# Patient Record
Sex: Female | Born: 1945 | Race: Black or African American | Hispanic: No | Smoking: Former smoker
Health system: Southern US, Community
[De-identification: ages and names within clinical notes are randomized; demographics above are authoritative.]

## PROBLEM LIST (undated history)

## (undated) DIAGNOSIS — R351 Nocturia: Secondary | ICD-10-CM

## (undated) DIAGNOSIS — E871 Hypo-osmolality and hyponatremia: Secondary | ICD-10-CM

## (undated) DIAGNOSIS — R5381 Other malaise: Secondary | ICD-10-CM

## (undated) DIAGNOSIS — J302 Other seasonal allergic rhinitis: Secondary | ICD-10-CM

## (undated) DIAGNOSIS — K219 Gastro-esophageal reflux disease without esophagitis: Secondary | ICD-10-CM

## (undated) DIAGNOSIS — R531 Weakness: Secondary | ICD-10-CM

## (undated) DIAGNOSIS — R400 Somnolence: Secondary | ICD-10-CM

## (undated) DIAGNOSIS — E785 Hyperlipidemia, unspecified: Secondary | ICD-10-CM

## (undated) DIAGNOSIS — I952 Hypotension due to drugs: Secondary | ICD-10-CM

## (undated) DIAGNOSIS — M797 Fibromyalgia: Secondary | ICD-10-CM

## (undated) DIAGNOSIS — M542 Cervicalgia: Secondary | ICD-10-CM

## (undated) DIAGNOSIS — E46 Unspecified protein-calorie malnutrition: Secondary | ICD-10-CM

## (undated) DIAGNOSIS — R3915 Urgency of urination: Secondary | ICD-10-CM

## (undated) DIAGNOSIS — R350 Frequency of micturition: Secondary | ICD-10-CM

## (undated) DIAGNOSIS — M549 Dorsalgia, unspecified: Secondary | ICD-10-CM

## (undated) DIAGNOSIS — M4712 Other spondylosis with myelopathy, cervical region: Secondary | ICD-10-CM

## (undated) DIAGNOSIS — D62 Acute posthemorrhagic anemia: Secondary | ICD-10-CM

## (undated) DIAGNOSIS — M62838 Other muscle spasm: Secondary | ICD-10-CM

## (undated) DIAGNOSIS — R1310 Dysphagia, unspecified: Secondary | ICD-10-CM

## (undated) DIAGNOSIS — M2550 Pain in unspecified joint: Secondary | ICD-10-CM

## (undated) DIAGNOSIS — F419 Anxiety disorder, unspecified: Secondary | ICD-10-CM

## (undated) DIAGNOSIS — M1990 Unspecified osteoarthritis, unspecified site: Secondary | ICD-10-CM

## (undated) DIAGNOSIS — H269 Unspecified cataract: Secondary | ICD-10-CM

## (undated) DIAGNOSIS — F329 Major depressive disorder, single episode, unspecified: Secondary | ICD-10-CM

## (undated) DIAGNOSIS — G8929 Other chronic pain: Secondary | ICD-10-CM

## (undated) DIAGNOSIS — I10 Essential (primary) hypertension: Secondary | ICD-10-CM

## (undated) DIAGNOSIS — F32A Depression, unspecified: Secondary | ICD-10-CM

## (undated) DIAGNOSIS — J189 Pneumonia, unspecified organism: Secondary | ICD-10-CM

## (undated) HISTORY — DX: Other spondylosis with myelopathy, cervical region: M47.12

## (undated) HISTORY — DX: Somnolence: R40.0

## (undated) HISTORY — DX: Hypotension due to drugs: I95.2

## (undated) HISTORY — PX: HAND SURGERY: SHX662

## (undated) HISTORY — DX: Essential (primary) hypertension: I10

## (undated) HISTORY — DX: Depression, unspecified: F32.A

## (undated) HISTORY — DX: Fibromyalgia: M79.7

## (undated) HISTORY — DX: Other malaise: R53.81

## (undated) HISTORY — DX: Dysphagia, unspecified: R13.10

## (undated) HISTORY — DX: Acute posthemorrhagic anemia: D62

## (undated) HISTORY — DX: Anxiety disorder, unspecified: F41.9

## (undated) HISTORY — DX: Unspecified protein-calorie malnutrition: E46

## (undated) HISTORY — DX: Hyperlipidemia, unspecified: E78.5

## (undated) HISTORY — DX: Hypo-osmolality and hyponatremia: E87.1

## (undated) HISTORY — DX: Major depressive disorder, single episode, unspecified: F32.9

## (undated) HISTORY — PX: ABDOMINAL HYSTERECTOMY: SHX81

---

## 1997-12-08 ENCOUNTER — Encounter: Admission: RE | Admit: 1997-12-08 | Discharge: 1997-12-08 | Payer: Self-pay | Admitting: Internal Medicine

## 1998-02-05 ENCOUNTER — Encounter: Admission: RE | Admit: 1998-02-05 | Discharge: 1998-02-05 | Payer: Self-pay | Admitting: Internal Medicine

## 1998-02-19 ENCOUNTER — Encounter: Admission: RE | Admit: 1998-02-19 | Discharge: 1998-02-19 | Payer: Self-pay | Admitting: Internal Medicine

## 1998-03-01 ENCOUNTER — Encounter: Admission: RE | Admit: 1998-03-01 | Discharge: 1998-03-01 | Payer: Self-pay | Admitting: Hematology and Oncology

## 1998-03-15 DIAGNOSIS — K219 Gastro-esophageal reflux disease without esophagitis: Secondary | ICD-10-CM

## 1998-06-13 ENCOUNTER — Encounter: Admission: RE | Admit: 1998-06-13 | Discharge: 1998-06-13 | Payer: Self-pay | Admitting: Internal Medicine

## 1998-08-09 ENCOUNTER — Ambulatory Visit (HOSPITAL_COMMUNITY): Admission: RE | Admit: 1998-08-09 | Discharge: 1998-08-09 | Payer: Self-pay | Admitting: Internal Medicine

## 1998-08-09 ENCOUNTER — Encounter: Admission: RE | Admit: 1998-08-09 | Discharge: 1998-08-09 | Payer: Self-pay | Admitting: Internal Medicine

## 1998-10-30 ENCOUNTER — Encounter: Admission: RE | Admit: 1998-10-30 | Discharge: 1998-10-30 | Payer: Self-pay | Admitting: Hematology and Oncology

## 1998-12-07 ENCOUNTER — Ambulatory Visit (HOSPITAL_COMMUNITY): Admission: RE | Admit: 1998-12-07 | Discharge: 1998-12-07 | Payer: Self-pay | Admitting: Internal Medicine

## 1998-12-07 ENCOUNTER — Encounter: Admission: RE | Admit: 1998-12-07 | Discharge: 1998-12-07 | Payer: Self-pay | Admitting: Internal Medicine

## 1998-12-07 ENCOUNTER — Encounter: Payer: Self-pay | Admitting: Internal Medicine

## 1999-02-19 ENCOUNTER — Encounter: Admission: RE | Admit: 1999-02-19 | Discharge: 1999-02-19 | Payer: Self-pay | Admitting: Internal Medicine

## 1999-03-18 ENCOUNTER — Emergency Department (HOSPITAL_COMMUNITY): Admission: EM | Admit: 1999-03-18 | Discharge: 1999-03-18 | Payer: Self-pay | Admitting: Emergency Medicine

## 1999-03-18 ENCOUNTER — Encounter: Admission: RE | Admit: 1999-03-18 | Discharge: 1999-03-18 | Payer: Self-pay | Admitting: Internal Medicine

## 1999-12-25 ENCOUNTER — Ambulatory Visit (HOSPITAL_COMMUNITY): Admission: RE | Admit: 1999-12-25 | Discharge: 1999-12-25 | Payer: Self-pay | Admitting: *Deleted

## 2000-01-01 ENCOUNTER — Emergency Department (HOSPITAL_COMMUNITY): Admission: EM | Admit: 2000-01-01 | Discharge: 2000-01-01 | Payer: Self-pay | Admitting: Emergency Medicine

## 2000-11-28 ENCOUNTER — Encounter: Payer: Self-pay | Admitting: Emergency Medicine

## 2000-11-28 ENCOUNTER — Emergency Department (HOSPITAL_COMMUNITY): Admission: EM | Admit: 2000-11-28 | Discharge: 2000-11-28 | Payer: Self-pay | Admitting: Emergency Medicine

## 2001-02-16 ENCOUNTER — Emergency Department (HOSPITAL_COMMUNITY): Admission: EM | Admit: 2001-02-16 | Discharge: 2001-02-16 | Payer: Self-pay

## 2001-05-06 ENCOUNTER — Encounter: Payer: Self-pay | Admitting: Family Medicine

## 2001-05-06 ENCOUNTER — Encounter: Admission: RE | Admit: 2001-05-06 | Discharge: 2001-05-06 | Payer: Self-pay | Admitting: Family Medicine

## 2001-06-14 ENCOUNTER — Other Ambulatory Visit: Admission: RE | Admit: 2001-06-14 | Discharge: 2001-06-14 | Payer: Self-pay | Admitting: Internal Medicine

## 2001-09-26 ENCOUNTER — Inpatient Hospital Stay (HOSPITAL_COMMUNITY): Admission: EM | Admit: 2001-09-26 | Discharge: 2001-09-28 | Payer: Self-pay | Admitting: Emergency Medicine

## 2001-09-26 ENCOUNTER — Encounter: Payer: Self-pay | Admitting: Emergency Medicine

## 2001-09-28 ENCOUNTER — Encounter: Payer: Self-pay | Admitting: *Deleted

## 2002-02-15 ENCOUNTER — Encounter: Admission: RE | Admit: 2002-02-15 | Discharge: 2002-02-15 | Payer: Self-pay | Admitting: Internal Medicine

## 2002-08-29 ENCOUNTER — Encounter: Admission: RE | Admit: 2002-08-29 | Discharge: 2002-08-29 | Payer: Self-pay | Admitting: Internal Medicine

## 2002-09-05 ENCOUNTER — Encounter: Admission: RE | Admit: 2002-09-05 | Discharge: 2002-09-05 | Payer: Self-pay | Admitting: Internal Medicine

## 2002-09-06 ENCOUNTER — Ambulatory Visit (HOSPITAL_COMMUNITY): Admission: RE | Admit: 2002-09-06 | Discharge: 2002-09-06 | Payer: Self-pay | Admitting: Internal Medicine

## 2002-10-20 ENCOUNTER — Encounter: Admission: RE | Admit: 2002-10-20 | Discharge: 2002-10-20 | Payer: Self-pay | Admitting: Internal Medicine

## 2002-12-09 ENCOUNTER — Encounter: Admission: RE | Admit: 2002-12-09 | Discharge: 2002-12-09 | Payer: Self-pay | Admitting: Internal Medicine

## 2003-01-17 ENCOUNTER — Encounter: Admission: RE | Admit: 2003-01-17 | Discharge: 2003-01-17 | Payer: Self-pay | Admitting: Internal Medicine

## 2003-04-21 ENCOUNTER — Encounter: Admission: RE | Admit: 2003-04-21 | Discharge: 2003-04-21 | Payer: Self-pay | Admitting: Internal Medicine

## 2003-06-15 ENCOUNTER — Encounter: Admission: RE | Admit: 2003-06-15 | Discharge: 2003-06-15 | Payer: Self-pay | Admitting: Internal Medicine

## 2003-09-25 ENCOUNTER — Emergency Department (HOSPITAL_COMMUNITY): Admission: EM | Admit: 2003-09-25 | Discharge: 2003-09-25 | Payer: Self-pay | Admitting: Emergency Medicine

## 2003-09-29 ENCOUNTER — Emergency Department (HOSPITAL_COMMUNITY): Admission: EM | Admit: 2003-09-29 | Discharge: 2003-09-29 | Payer: Self-pay | Admitting: Emergency Medicine

## 2003-10-19 ENCOUNTER — Encounter: Admission: RE | Admit: 2003-10-19 | Discharge: 2003-10-19 | Payer: Self-pay | Admitting: Internal Medicine

## 2003-10-24 ENCOUNTER — Encounter: Admission: RE | Admit: 2003-10-24 | Discharge: 2003-10-24 | Payer: Self-pay | Admitting: Internal Medicine

## 2003-10-26 ENCOUNTER — Ambulatory Visit (HOSPITAL_COMMUNITY): Admission: RE | Admit: 2003-10-26 | Discharge: 2003-10-26 | Payer: Self-pay

## 2003-10-27 ENCOUNTER — Encounter: Admission: RE | Admit: 2003-10-27 | Discharge: 2003-10-27 | Payer: Self-pay | Admitting: Internal Medicine

## 2003-12-26 ENCOUNTER — Encounter: Admission: RE | Admit: 2003-12-26 | Discharge: 2003-12-26 | Payer: Self-pay | Admitting: Internal Medicine

## 2004-03-13 ENCOUNTER — Encounter: Admission: RE | Admit: 2004-03-13 | Discharge: 2004-03-13 | Payer: Self-pay | Admitting: Internal Medicine

## 2004-03-18 ENCOUNTER — Ambulatory Visit (HOSPITAL_COMMUNITY): Admission: RE | Admit: 2004-03-18 | Discharge: 2004-03-18 | Payer: Self-pay | Admitting: Internal Medicine

## 2004-03-21 ENCOUNTER — Ambulatory Visit: Payer: Self-pay | Admitting: Internal Medicine

## 2004-03-21 ENCOUNTER — Encounter: Admission: RE | Admit: 2004-03-21 | Discharge: 2004-03-21 | Payer: Self-pay | Admitting: Internal Medicine

## 2004-07-09 ENCOUNTER — Ambulatory Visit: Payer: Self-pay | Admitting: Internal Medicine

## 2004-07-24 ENCOUNTER — Ambulatory Visit: Payer: Self-pay | Admitting: Internal Medicine

## 2004-09-03 ENCOUNTER — Ambulatory Visit: Payer: Self-pay | Admitting: Internal Medicine

## 2004-10-29 ENCOUNTER — Ambulatory Visit (HOSPITAL_COMMUNITY): Admission: RE | Admit: 2004-10-29 | Discharge: 2004-10-29 | Payer: Self-pay | Admitting: Internal Medicine

## 2004-11-29 ENCOUNTER — Ambulatory Visit: Payer: Self-pay | Admitting: Internal Medicine

## 2005-02-13 ENCOUNTER — Ambulatory Visit: Payer: Self-pay | Admitting: Internal Medicine

## 2005-05-01 ENCOUNTER — Ambulatory Visit: Payer: Self-pay | Admitting: Hospitalist

## 2005-05-07 ENCOUNTER — Ambulatory Visit: Payer: Self-pay | Admitting: Internal Medicine

## 2005-09-05 ENCOUNTER — Ambulatory Visit: Payer: Self-pay | Admitting: Internal Medicine

## 2005-10-20 ENCOUNTER — Ambulatory Visit: Payer: Self-pay | Admitting: Internal Medicine

## 2005-10-30 ENCOUNTER — Ambulatory Visit (HOSPITAL_COMMUNITY): Admission: RE | Admit: 2005-10-30 | Discharge: 2005-10-30 | Payer: Self-pay | Admitting: Internal Medicine

## 2005-11-13 ENCOUNTER — Ambulatory Visit: Payer: Self-pay | Admitting: Internal Medicine

## 2005-11-27 ENCOUNTER — Ambulatory Visit: Payer: Self-pay | Admitting: Internal Medicine

## 2006-04-29 ENCOUNTER — Ambulatory Visit: Payer: Self-pay | Admitting: Internal Medicine

## 2006-04-29 DIAGNOSIS — IMO0001 Reserved for inherently not codable concepts without codable children: Secondary | ICD-10-CM

## 2006-04-29 DIAGNOSIS — I10 Essential (primary) hypertension: Secondary | ICD-10-CM

## 2006-04-29 DIAGNOSIS — F329 Major depressive disorder, single episode, unspecified: Secondary | ICD-10-CM

## 2006-04-29 DIAGNOSIS — M949 Disorder of cartilage, unspecified: Secondary | ICD-10-CM

## 2006-04-29 DIAGNOSIS — M545 Low back pain: Secondary | ICD-10-CM

## 2006-04-29 DIAGNOSIS — F411 Generalized anxiety disorder: Secondary | ICD-10-CM | POA: Insufficient documentation

## 2006-04-29 DIAGNOSIS — M899 Disorder of bone, unspecified: Secondary | ICD-10-CM | POA: Insufficient documentation

## 2006-04-29 DIAGNOSIS — F32A Depression, unspecified: Secondary | ICD-10-CM | POA: Insufficient documentation

## 2006-04-30 ENCOUNTER — Ambulatory Visit: Payer: Self-pay | Admitting: Internal Medicine

## 2006-05-20 DIAGNOSIS — Z9079 Acquired absence of other genital organ(s): Secondary | ICD-10-CM | POA: Insufficient documentation

## 2006-05-20 DIAGNOSIS — Z87891 Personal history of nicotine dependence: Secondary | ICD-10-CM

## 2006-09-24 ENCOUNTER — Ambulatory Visit (HOSPITAL_COMMUNITY): Admission: RE | Admit: 2006-09-24 | Discharge: 2006-09-24 | Payer: Self-pay | Admitting: Internal Medicine

## 2006-09-24 ENCOUNTER — Ambulatory Visit: Payer: Self-pay | Admitting: Hospitalist

## 2006-09-24 ENCOUNTER — Encounter (INDEPENDENT_AMBULATORY_CARE_PROVIDER_SITE_OTHER): Payer: Self-pay | Admitting: Pulmonary Disease

## 2006-09-24 DIAGNOSIS — R071 Chest pain on breathing: Secondary | ICD-10-CM | POA: Insufficient documentation

## 2006-09-24 DIAGNOSIS — E785 Hyperlipidemia, unspecified: Secondary | ICD-10-CM | POA: Insufficient documentation

## 2006-09-24 DIAGNOSIS — N390 Urinary tract infection, site not specified: Secondary | ICD-10-CM

## 2006-09-24 DIAGNOSIS — G43909 Migraine, unspecified, not intractable, without status migrainosus: Secondary | ICD-10-CM | POA: Insufficient documentation

## 2006-09-24 DIAGNOSIS — M19049 Primary osteoarthritis, unspecified hand: Secondary | ICD-10-CM | POA: Insufficient documentation

## 2006-09-24 DIAGNOSIS — R05 Cough: Secondary | ICD-10-CM

## 2006-09-24 LAB — CONVERTED CEMR LAB
ALT: 13 units/L (ref 0–35)
AST: 18 units/L (ref 0–37)
BUN: 9 mg/dL (ref 6–23)
Bilirubin Urine: NEGATIVE
CRP: 0.1 mg/dL (ref <0.6)
Chloride: 101 meq/L (ref 96–112)
Glucose, Bld: 94 mg/dL (ref 70–99)
Ketones, ur: NEGATIVE mg/dL
Potassium: 3.5 meq/L (ref 3.5–5.3)
RBC / HPF: NONE SEEN (ref <3)
Rhuematoid fact SerPl-aCnc: <20 intl units/mL (ref 0–20)
Total Bilirubin: 0.5 mg/dL (ref 0.3–1.2)
Urobilinogen, UA: 0.2 (ref 0.0–1.0)
WBC, UA: NONE SEEN cells/hpf (ref <3)

## 2006-09-29 ENCOUNTER — Ambulatory Visit: Payer: Self-pay | Admitting: Hospitalist

## 2006-10-08 ENCOUNTER — Ambulatory Visit: Payer: Self-pay | Admitting: *Deleted

## 2006-10-08 ENCOUNTER — Encounter (INDEPENDENT_AMBULATORY_CARE_PROVIDER_SITE_OTHER): Payer: Self-pay | Admitting: *Deleted

## 2006-10-22 ENCOUNTER — Ambulatory Visit: Payer: Self-pay | Admitting: Internal Medicine

## 2006-11-02 ENCOUNTER — Ambulatory Visit (HOSPITAL_COMMUNITY): Admission: RE | Admit: 2006-11-02 | Discharge: 2006-11-02 | Payer: Self-pay | Admitting: Obstetrics & Gynecology

## 2006-11-05 ENCOUNTER — Ambulatory Visit: Payer: Self-pay | Admitting: Internal Medicine

## 2006-11-06 ENCOUNTER — Telehealth: Payer: Self-pay | Admitting: *Deleted

## 2006-11-10 ENCOUNTER — Encounter (INDEPENDENT_AMBULATORY_CARE_PROVIDER_SITE_OTHER): Payer: Self-pay | Admitting: Pulmonary Disease

## 2006-11-10 ENCOUNTER — Ambulatory Visit: Payer: Self-pay | Admitting: Internal Medicine

## 2006-11-10 LAB — CONVERTED CEMR LAB
CO2: 26 meq/L (ref 19–32)
Chloride: 103 meq/L (ref 96–112)
Creatinine, Ser: 0.76 mg/dL (ref 0.40–1.20)
LDL Cholesterol: 133 mg/dL — ABNORMAL HIGH (ref 0–99)
Potassium: 3.7 meq/L (ref 3.5–5.3)
Total CHOL/HDL Ratio: 3.9
Triglycerides: 109 mg/dL (ref <150)
VLDL: 22 mg/dL (ref 0–40)

## 2006-11-13 ENCOUNTER — Ambulatory Visit: Payer: Self-pay | Admitting: Cardiology

## 2006-11-13 ENCOUNTER — Encounter (INDEPENDENT_AMBULATORY_CARE_PROVIDER_SITE_OTHER): Payer: Self-pay | Admitting: Pulmonary Disease

## 2006-11-24 ENCOUNTER — Encounter (INDEPENDENT_AMBULATORY_CARE_PROVIDER_SITE_OTHER): Payer: Self-pay | Admitting: Internal Medicine

## 2006-11-24 ENCOUNTER — Ambulatory Visit: Payer: Self-pay

## 2006-12-08 ENCOUNTER — Ambulatory Visit: Payer: Self-pay | Admitting: Internal Medicine

## 2007-01-04 ENCOUNTER — Encounter (INDEPENDENT_AMBULATORY_CARE_PROVIDER_SITE_OTHER): Payer: Self-pay | Admitting: Internal Medicine

## 2007-01-04 DIAGNOSIS — R079 Chest pain, unspecified: Secondary | ICD-10-CM

## 2007-04-27 ENCOUNTER — Encounter (INDEPENDENT_AMBULATORY_CARE_PROVIDER_SITE_OTHER): Payer: Self-pay | Admitting: *Deleted

## 2007-04-27 ENCOUNTER — Ambulatory Visit: Payer: Self-pay | Admitting: Internal Medicine

## 2007-04-27 LAB — CONVERTED CEMR LAB
CO2: 26 meq/L (ref 19–32)
Creatinine, Ser: 1.12 mg/dL (ref 0.40–1.20)
Glucose, Bld: 98 mg/dL (ref 70–99)
Potassium: 4.2 meq/L (ref 3.5–5.3)

## 2007-05-04 ENCOUNTER — Ambulatory Visit: Payer: Self-pay | Admitting: Hospitalist

## 2007-05-04 ENCOUNTER — Encounter (INDEPENDENT_AMBULATORY_CARE_PROVIDER_SITE_OTHER): Payer: Self-pay | Admitting: *Deleted

## 2007-05-05 LAB — CONVERTED CEMR LAB
BUN: 10 mg/dL (ref 6–23)
Cholesterol: 201 mg/dL — ABNORMAL HIGH (ref 0–200)
Glucose, Bld: 101 mg/dL — ABNORMAL HIGH (ref 70–99)
HDL: 53 mg/dL (ref >39)
Sodium: 141 meq/L (ref 135–145)
Total CHOL/HDL Ratio: 3.8

## 2007-09-02 ENCOUNTER — Ambulatory Visit: Payer: Self-pay | Admitting: Hospitalist

## 2007-09-05 LAB — CONVERTED CEMR LAB: OCCULT 2: NEGATIVE

## 2007-09-08 ENCOUNTER — Ambulatory Visit: Payer: Self-pay | Admitting: Internal Medicine

## 2007-11-01 ENCOUNTER — Ambulatory Visit: Payer: Self-pay | Admitting: Internal Medicine

## 2007-11-01 ENCOUNTER — Encounter: Payer: Self-pay | Admitting: Internal Medicine

## 2007-11-01 LAB — CONVERTED CEMR LAB
BUN: 11 mg/dL (ref 6–23)
CO2: 28 meq/L (ref 19–32)
Chloride: 101 meq/L (ref 96–112)
Potassium: 4.2 meq/L (ref 3.5–5.3)

## 2007-11-03 ENCOUNTER — Ambulatory Visit (HOSPITAL_COMMUNITY): Admission: RE | Admit: 2007-11-03 | Discharge: 2007-11-03 | Payer: Self-pay | Admitting: Pulmonary Disease

## 2007-11-16 ENCOUNTER — Ambulatory Visit: Payer: Self-pay | Admitting: Internal Medicine

## 2007-11-30 ENCOUNTER — Ambulatory Visit: Payer: Self-pay | Admitting: Internal Medicine

## 2007-12-22 ENCOUNTER — Ambulatory Visit: Payer: Self-pay | Admitting: Internal Medicine

## 2008-01-20 ENCOUNTER — Encounter: Payer: Self-pay | Admitting: Internal Medicine

## 2008-01-20 ENCOUNTER — Ambulatory Visit: Payer: Self-pay | Admitting: Internal Medicine

## 2008-01-20 DIAGNOSIS — R1013 Epigastric pain: Secondary | ICD-10-CM

## 2008-01-23 LAB — CONVERTED CEMR LAB
Albumin: 4.5 g/dL (ref 3.5–5.2)
CO2: 29 meq/L (ref 19–32)
Chloride: 102 meq/L (ref 96–112)
Cholesterol: 213 mg/dL — ABNORMAL HIGH (ref 0–200)
Creatinine, Ser: 0.83 mg/dL (ref 0.40–1.20)
Lipase: 33 units/L (ref 0–75)
Potassium: 4 meq/L (ref 3.5–5.3)
Sodium: 141 meq/L (ref 135–145)
Total CHOL/HDL Ratio: 4
Triglycerides: 87 mg/dL (ref <150)
VLDL: 17 mg/dL (ref 0–40)

## 2008-01-24 ENCOUNTER — Telehealth: Payer: Self-pay | Admitting: *Deleted

## 2008-02-01 ENCOUNTER — Ambulatory Visit: Payer: Self-pay | Admitting: Internal Medicine

## 2008-06-05 ENCOUNTER — Encounter: Payer: Self-pay | Admitting: Internal Medicine

## 2008-06-05 ENCOUNTER — Ambulatory Visit: Payer: Self-pay | Admitting: *Deleted

## 2008-06-05 LAB — CONVERTED CEMR LAB
Bilirubin Urine: NEGATIVE
Ketones, urine, test strip: NEGATIVE
Nitrite: NEGATIVE
Urobilinogen, UA: 0.2
WBC Urine, dipstick: NEGATIVE
pH: 7.5

## 2008-06-12 ENCOUNTER — Encounter: Payer: Self-pay | Admitting: Internal Medicine

## 2008-06-12 ENCOUNTER — Ambulatory Visit (HOSPITAL_COMMUNITY): Admission: RE | Admit: 2008-06-12 | Discharge: 2008-06-12 | Payer: Self-pay | Admitting: Internal Medicine

## 2008-10-20 ENCOUNTER — Ambulatory Visit: Payer: Self-pay | Admitting: *Deleted

## 2008-10-20 ENCOUNTER — Encounter: Payer: Self-pay | Admitting: Internal Medicine

## 2008-10-20 LAB — CONVERTED CEMR LAB
ALT: 11 units/L (ref 0–35)
Bilirubin Urine: NEGATIVE
Chloride: 103 meq/L (ref 96–112)
GFR calc Af Amer: >60 mL/min (ref >60)
GFR calc non Af Amer: >60 mL/min (ref >60)
Glucose, Urine, Semiquant: NEGATIVE
Magnesium: 2.2 mg/dL (ref 1.5–2.5)
Potassium: 3.9 meq/L (ref 3.5–5.3)
Sodium: 142 meq/L (ref 135–145)
Specific Gravity, Urine: 1.01
TSH: 0.48 microintl units/mL (ref 0.350–4.500)
Total CK: 191 units/L — ABNORMAL HIGH (ref 7–177)
Urobilinogen, UA: 0.2
WBC Urine, dipstick: NEGATIVE

## 2008-12-19 ENCOUNTER — Encounter (INDEPENDENT_AMBULATORY_CARE_PROVIDER_SITE_OTHER): Payer: Self-pay | Admitting: *Deleted

## 2008-12-19 ENCOUNTER — Ambulatory Visit: Payer: Self-pay | Admitting: Infectious Disease

## 2008-12-19 LAB — CONVERTED CEMR LAB
Ketones, urine, test strip: NEGATIVE
Leukocytes, UA: NEGATIVE
Nitrite: NEGATIVE
Protein, U semiquant: NEGATIVE
Specific Gravity, Urine: 1.015
Specific Gravity, Urine: 1.011 (ref 1.005–1.030)

## 2008-12-21 ENCOUNTER — Telehealth: Payer: Self-pay | Admitting: *Deleted

## 2008-12-21 DIAGNOSIS — N760 Acute vaginitis: Secondary | ICD-10-CM | POA: Insufficient documentation

## 2008-12-21 LAB — CONVERTED CEMR LAB
Gardnerella vaginalis: POSITIVE — AB
Trichomonal Vaginitis: NEGATIVE

## 2009-02-26 ENCOUNTER — Encounter: Payer: Self-pay | Admitting: Internal Medicine

## 2009-02-26 ENCOUNTER — Ambulatory Visit: Payer: Self-pay | Admitting: Internal Medicine

## 2009-02-26 LAB — CONVERTED CEMR LAB
Bilirubin Urine: NEGATIVE
Glucose, Urine, Semiquant: NEGATIVE
Protein, U semiquant: NEGATIVE
Specific Gravity, Urine: 1.01
WBC Urine, dipstick: NEGATIVE

## 2009-02-27 LAB — CONVERTED CEMR LAB
CO2: 27 meq/L (ref 19–32)
Calcium: 9.6 mg/dL (ref 8.4–10.5)
Cholesterol: 242 mg/dL — ABNORMAL HIGH (ref 0–200)
LDL Cholesterol: 166 mg/dL — ABNORMAL HIGH (ref 0–99)
Triglycerides: 108 mg/dL (ref <150)

## 2009-03-01 ENCOUNTER — Ambulatory Visit (HOSPITAL_COMMUNITY): Admission: RE | Admit: 2009-03-01 | Discharge: 2009-03-01 | Payer: Self-pay | Admitting: Internal Medicine

## 2009-03-05 ENCOUNTER — Ambulatory Visit: Payer: Self-pay | Admitting: Internal Medicine

## 2009-03-05 LAB — CONVERTED CEMR LAB
OCCULT 1: NEGATIVE
OCCULT 3: NEGATIVE

## 2009-07-03 ENCOUNTER — Telehealth: Payer: Self-pay | Admitting: Internal Medicine

## 2009-07-06 ENCOUNTER — Emergency Department (HOSPITAL_COMMUNITY): Admission: EM | Admit: 2009-07-06 | Discharge: 2009-07-06 | Payer: Self-pay | Admitting: Emergency Medicine

## 2009-07-09 ENCOUNTER — Ambulatory Visit: Payer: Self-pay | Admitting: Internal Medicine

## 2009-10-03 ENCOUNTER — Ambulatory Visit: Payer: Self-pay | Admitting: Infectious Diseases

## 2009-11-06 ENCOUNTER — Telehealth: Payer: Self-pay | Admitting: Internal Medicine

## 2010-01-03 ENCOUNTER — Emergency Department (HOSPITAL_COMMUNITY): Admission: EM | Admit: 2010-01-03 | Discharge: 2010-01-03 | Payer: Self-pay | Admitting: Emergency Medicine

## 2010-03-05 ENCOUNTER — Ambulatory Visit (HOSPITAL_COMMUNITY): Admission: RE | Admit: 2010-03-05 | Discharge: 2010-03-05 | Payer: Self-pay | Admitting: Internal Medicine

## 2010-04-09 ENCOUNTER — Ambulatory Visit: Payer: Self-pay | Admitting: Internal Medicine

## 2010-04-09 ENCOUNTER — Telehealth: Payer: Self-pay | Admitting: Licensed Clinical Social Worker

## 2010-04-09 DIAGNOSIS — R259 Unspecified abnormal involuntary movements: Secondary | ICD-10-CM | POA: Insufficient documentation

## 2010-04-09 LAB — CONVERTED CEMR LAB
ALT: 17 units/L (ref 0–35)
AST: 20 units/L (ref 0–37)
Alkaline Phosphatase: 85 units/L (ref 39–117)
Calcium: 9.2 mg/dL (ref 8.4–10.5)
Cholesterol, target level: 200 mg/dL
Creatinine, Ser: 0.75 mg/dL (ref 0.40–1.20)
Phosphorus: 2.9 mg/dL (ref 2.3–4.6)
Potassium: 3.9 meq/L (ref 3.5–5.3)
TSH: 0.625 microintl units/mL (ref 0.350–4.5)
Total Protein: 7.2 g/dL (ref 6.0–8.3)
Vit D, 25-Hydroxy: 41 ng/mL (ref 30–89)

## 2010-05-15 ENCOUNTER — Ambulatory Visit: Payer: Self-pay | Admitting: Internal Medicine

## 2010-08-27 ENCOUNTER — Ambulatory Visit: Admission: RE | Admit: 2010-08-27 | Discharge: 2010-08-27 | Payer: Self-pay | Source: Home / Self Care

## 2010-08-29 NOTE — Assessment & Plan Note (Signed)
Summary: est-ck/fu/meds/cfb   Vital Signs:  Patient profile:   65 year old female Height:      69 inches Weight:      136.4 pounds BMI:     20.22 O2 Sat:      98 % on Room air Temp:     97.8 degrees F oral Pulse rate:   72 / minute BP sitting:   161 / 88  (right arm)  Vitals Entered By: Filomena Jungling NT II (October 03, 2009 1:32 PM)  O2 Flow:  Room air CC: trouble with blood pressure, ? allergy, Depression Is Patient Diabetic? No Pain Assessment Patient in pain? yes     Location: headaches Intensity: 10 Type: aching Onset of pain  Chronic Nutritional Status BMI of 19 -24 = normal  Have you ever been in a relationship where you felt threatened, hurt or afraid?No   Does patient need assistance? Functional Status Self care Ambulation Normal   Primary Care Provider:  Mariea Stable MD  CC:  trouble with blood pressure, ? allergy, and Depression.  History of Present Illness: Mrs Havel is a 65 yo womanan with pMH as outlined below.  She is here for f/u visit.  She is taking her medications but c/o itching and coughing.  She continues to report normal blood pressures at home.      Depression History:      The patient denies a depressed mood most of the day and a diminished interest in her usual daily activities.         Preventive Screening-Counseling & Management  Alcohol-Tobacco     Alcohol drinks/day: 0     Smoking Status: quit     Year Quit: 20+ years ago  Caffeine-Diet-Exercise     Does Patient Exercise: no  Current Medications (verified): 1)  Hydrochlorothiazide 25 Mg Tabs (Hydrochlorothiazide) .Marland Kitchen.. 1 Tablet Daily 2)  Xanax 0.5 Mg Tabs (Alprazolam) .Marland Kitchen.. 1 Tab Two Times A Day As Needed 3)  Norvasc 5 Mg Tabs (Amlodipine Besylate) .... Take 1 Tablet By Mouth Once A Day  Allergies (verified): 1)  ! Codeine 2)  ! Lexapro 3)  ! Hydrocodone 4)  ! Oxybutynin Chloride  Past History:  Past Medical History: Last updated:  12/19/2008 Anxiety Depression Osteopenia (no DEXA in system) Hypertension Question of fibromyalgia (ESR, CRP, ANA, RF all wnl). Hyperlipidemia Recurrent UTI's ? (only one U/C x 2008 and it was negative)  Past Surgical History: Last updated: 04/29/2006 Hysterectomy  Family History: Last updated: 02/26/2009 Father:  alcoholic induced CM, ESRD Brother:  throat cancer 2/2 tobacco otherwise HTN  Social History: Last updated: 02/26/2009 Single. Single Former Smoker (only smoked for about 5-6 years in her youth) Alcohol use-no Drug use-no  Risk Factors: Smoking Status: quit (10/03/2009)  Review of Systems      See HPI  Physical Exam  General:  alert, normal appearance, and cooperative to examination.   Lungs:  normal respiratory effort and no accessory muscle use.   Extremities:  no edema Neurologic:  alert & oriented X3, cranial nerves II-XII intact, strength normal in all extremities, and gait normal.   Psych:  Oriented X3, memory intact for recent and remote, normally interactive, good eye contact, not anxious appearing, and not depressed appearing.     Impression & Recommendations:  Problem # 1:  HYPERTENSION (ICD-401.9) Discussed at length.  She prefers to continue current medication before trying new medications Will increase to 10mg  daily.  Her updated medication list for this problem includes:  Hydrochlorothiazide 25 Mg Tabs (Hydrochlorothiazide) .Marland Kitchen... 1 tablet daily    Norvasc 5 Mg Tabs (Amlodipine besylate) .Marland Kitchen... Take 1 tablet by mouth once a day    Amlodipine Besylate 10 Mg Tabs (Amlodipine besylate) .Marland Kitchen... Take 1 tablet by mouth once a day  BP today: 161/88 Prior BP: 171/92 (07/09/2009)  Labs Reviewed: K+: 3.6 (02/26/2009) Creat: : 0.86 (02/26/2009)   Chol: 242 (02/26/2009)   HDL: 54 (02/26/2009)   LDL: 166 (02/26/2009)   TG: 108 (02/26/2009)  Complete Medication List: 1)  Hydrochlorothiazide 25 Mg Tabs (Hydrochlorothiazide) .Marland Kitchen.. 1 tablet daily 2)   Xanax 0.5 Mg Tabs (Alprazolam) .Marland Kitchen.. 1 tab two times a day as needed 3)  Norvasc 5 Mg Tabs (Amlodipine besylate) .... Take 1 tablet by mouth once a day 4)  Amlodipine Besylate 10 Mg Tabs (Amlodipine besylate) .... Take 1 tablet by mouth once a day  Patient Instructions: 1)  Please schedule a follow-up appointment in 6 months. 2)  Will increase your blood pressure medicine to 10mg , can take 2 of the 5 mg tablets you have until done.   3)  continue all your other medications.   4)  If you have any problems or need refills, call clinic.  Prescriptions: AMLODIPINE BESYLATE 10 MG TABS (AMLODIPINE BESYLATE) Take 1 tablet by mouth once a day  #30 x 6   Entered and Authorized by:   Mariea Stable MD   Signed by:   Mariea Stable MD on 10/03/2009   Method used:   Print then Give to Patient   RxID:   [id number]     Prevention & Chronic Care Immunizations   Influenza vaccine: Not documented   Influenza vaccine deferral: Refused  (07/09/2009)    Tetanus booster: Not documented   Td booster deferral: Refused  (07/09/2009)    Pneumococcal vaccine: Not documented    H. zoster vaccine: Not documented   H. zoster vaccine deferral: Deferred  (07/09/2009)  Colorectal Screening   Hemoccult: Negative  (05/03/2005)   Hemoccult action/deferral: Ordered  (02/26/2009)    Colonoscopy: Not documented   Colonoscopy action/deferral: Refused  (02/26/2009)  Other Screening   Pap smear: Not documented   Pap smear action/deferral: Not indicated S/P hysterectomy  (02/26/2009)    Mammogram: ASSESSMENT: Negative - BI-RADS 1^MM DIGITAL SCREENING  (03/01/2009)   Mammogram action/deferral: Ordered  (02/26/2009)    DXA bone density scan: Not documented   Smoking status: quit  (10/03/2009)  Lipids   Total Cholesterol: 242  (02/26/2009)   Lipid panel action/deferral: Lipid Panel ordered   LDL: 166  (02/26/2009)   LDL Direct: Not documented   HDL: 54  (02/26/2009)   Triglycerides: 108   (02/26/2009)    SGOT (AST): 18  (10/20/2008)   SGPT (ALT): 11  (10/20/2008)   Alkaline phosphatase: 91  (10/20/2008)   Total bilirubin: 0.5  (10/20/2008)    Lipid flowsheet reviewed?: Yes   Progress toward LDL goal: Unchanged  Hypertension   Last Blood Pressure: 161 / 88  (10/03/2009)   Serum creatinine: 0.86  (02/26/2009)   Serum potassium 3.6  (02/26/2009)    Hypertension flowsheet reviewed?: Yes   Progress toward BP goal: Unchanged  Self-Management Support :    Patient will work on the following items until the next clinic visit to reach self-care goals:     Medications and monitoring: take my medicines every day, bring all of my medications to every visit  (10/03/2009)     Eating: eat more vegetables, eat foods that are low in  salt, eat baked foods instead of fried foods  (10/03/2009)    Hypertension self-management support: Education handout, Resources for patients handout, Written self-care plan  (10/03/2009)   Hypertension self-care plan printed.   Hypertension education handout printed    Lipid self-management support: Education handout, Resources for patients handout, Written self-care plan  (10/03/2009)   Lipid self-care plan printed.   Lipid education handout printed      Resource handout printed.

## 2010-08-29 NOTE — Progress Notes (Signed)
Summary: Refill/gh  Phone Note Refill Request Message from:  Fax from Pharmacy on November 06, 2009 2:45 PM  Refills Requested: Medication #1:  XANAX 0.5 MG TABS 1 tab two times a day as needed   Last Refilled: 10/08/2009  Method Requested: Fax to Local Pharmacy Initial call taken by: Angelina Ok RN,  November 06, 2009 2:45 PM  Follow-up for Phone Call        Refill approved-nurse to complete Follow-up by: Mariea Stable MD,  November 06, 2009 3:45 PM  Additional Follow-up for Phone Call Additional follow up Details #1::        Rx faxed to pharmacy Additional Follow-up by: Angelina Ok RN,  November 07, 2009 10:46 AM    Prescriptions: Prudy Feeler 0.5 MG TABS (ALPRAZOLAM) 1 tab two times a day as needed  #62 x 3   Entered and Authorized by:   Mariea Stable MD   Signed by:   Mariea Stable MD on 11/06/2009   Method used:   Telephoned to ...       [facility] (retail)       [address] [place], [place]  [address]       Ph: [phone]       Fax: [phone]   RxID:   [id number]

## 2010-08-29 NOTE — Assessment & Plan Note (Signed)
Summary: 6 mo F/U HTN, lipids, anxiety, abd pain. New onset tremors.   Vital Signs:  Patient profile:   65 year old female Height:      69 inches (175.26 cm) Weight:      131.1 pounds (59.59 kg) BMI:     19.43 O2 Sat:      98 % on Room air Temp:     97.0 degrees F oral Pulse rate:   69 / minute BP sitting:   160 / 83  (left arm)  Vitals Entered By: Chinita Pester RN (April 09, 2010 8:33 AM)  O2 Flow:  Room air CC: 6 month f/u for hypertension, lipids, abdominal pain.  Having occasional tremors. Med. refills. Is Patient Diabetic? No Pain Assessment Patient in pain? yes     Location: "all over" Intensity: 9 Type: aching Onset of pain  Chronic Nutritional Status BMI of 19 -24 = normal  Have you ever been in a relationship where you felt threatened, hurt or afraid?Unable to ask;sister w/pt.   Does patient need assistance? Functional Status Self care Ambulation Normal   Primary Care Provider:  Mariea Stable MD  CC:  6 month f/u for hypertension, lipids, and abdominal pain.  Having occasional tremors. Med. refills..  History of Present Illness: Patient  is a 65 year old female with a PMHX of fibromyalgia, HTN, Anxiety, osteopenia who presents to clinic for new onset tremors and 6 month follow-up of the following conditions: 1) HTN - pt states home blood pressure is well-controlled with avg of 108/60-70 mmHg, as low as 90s/60s mmHg. However, she notes increased anxiety at doctor's office, which she thinks causeshigher readings. Patient tolerating increased dosage of Amlodipine, without dizziness, lightheadedness, blacking out.  2) Tremors - x few months, occuring daily, midday, not associated with fasting. 1/2-1 cup coffee daily, no other caffeine, no supplements including caffeine or stimulants. Involving hands, legs. Can last all day, causing difficulty writing, holding objects, walking. No family member with similar episodes. Seems to be precipitated by and worsened with  anxiety. No previous thyroid disorder.  3) Anxiety and Depression -  anxiety getting progressively worse over past 3 years, especially last few months. Having increased family financial stressors. Feels like it will be a short term stress that will eventually resolve. During anxiety episodes becomes isolated, tremulous. Confirms insomnia and wanting to be more isolated to avoid stressors. Denies suicidal or homicidal ideations, denies anhedonia. States she has previously been tried on "multiple depression medications" (unable to specify), but gave her stomach upset and nausea. Was supposed to start Cymbalta in 2010, but did not start for fear of stomach upset. States Xanax is the only thing that works for her.  4) GERD - feeling of gaseousness,  abdominal discomfort in epigastric region. Not worsened by spicy foods or caffeine. States actually worsened by bland foods. Prior history of H.pylori in 2009 for which she was treated. Not currently on prophylactic medications.  5) Osteopenia - taking Ca-Vitamin D supplementation daily. No new fractures.   Depression History:      The patient denies a depressed mood most of the day and a diminished interest in her usual daily activities.  Positive alarm features for depression include insomnia, psychomotor agitation, and fatigue (loss of energy).  However, she denies significant weight loss, significant weight gain, feelings of worthlessness (guilt), and recurrent thoughts of death or suicide.         Dyspepsia History:      There is a prior history of  GERD.  An H-2 blocker medication is not currently being taken.    Lipid Management History:      Positive NCEP/ATP III risk factors include female age 57 years old or older and hypertension.  Negative NCEP/ATP III risk factors include non-tobacco-user status.           Preventive Screening-Counseling & Management  Alcohol-Tobacco     Alcohol drinks/day: 0     Smoking Status: quit     Year Quit: 20+  years ago  Caffeine-Diet-Exercise     Does Patient Exercise: no  Current Medications (verified): 1)  Hydrochlorothiazide 25 Mg Tabs (Hydrochlorothiazide) .Marland Kitchen.. 1 Tablet Daily 2)  Xanax 0.5 Mg Tabs (Alprazolam) .Marland Kitchen.. 1 Tab Two Times A Day As Needed 3)  Amlodipine Besylate 10 Mg Tabs (Amlodipine Besylate) .... Take 1 Tablet By Mouth Once A Day  Allergies: 1)  ! Codeine 2)  ! Lexapro 3)  ! Hydrocodone 4)  ! Oxybutynin Chloride  Review of Systems       Per HPI  Physical Exam  General:  Well-developed,well-nourished,in no acute distress; alert,appropriate and cooperative throughout examination Head:  Normocephalic and atraumatic without obvious abnormalities. No apparent alopecia or balding. Neck:  No deformities, masses, or tenderness noted. Lungs:  Normal respiratory effort, chest expands symmetrically. Lungs are clear to auscultation, no crackles or wheezes. Heart:  Normal rate and regular rhythm. S1 and S2 normal without gallop, murmur, click, rub or other extra sounds. Abdomen:  Bowel sounds positive,abdomen soft and non-distended, tenderness to palpation over epigastric region, no organomegaly appreciated.  Msk:  No deformity or scoliosis noted of thoracic or lumbar spine.   Pulses:  R and L carotid,radial,femoral,dorsalis pedis and posterior tibial pulses are full and equal bilaterally Extremities:  No clubbing, cyanosis, edema, or deformity noted with normal full range of motion of all joints.     Impression & Recommendations:  Problem # 1:  OSTEOPENIA (ICD-733.90) No history of hip fractures, no recent fractures, taking Calcium-Vitamin D 1000mg  daily. Last Bone scan 2009 showing osteopenia with left femoral head T score of -2.4. Calculated FRAX score, 10 year probability of major osteoporotic fracture 11%, 10 year probability of hip fracture 2.1%. Guidelines are not completely clear regarding initiation of bisphosphonate therapy for osteopenia.  - Will stress importance of  daily intake of 1200mg  elemental calcium and 800 international units of vitamin D. - Will research guidelines regarding initiation of bisphosphonates and call patient to follow-up if bisphosphonate therapy is indicated at this time. - Consider follow-up DEXA Scan order next visit, to allow for 2 year interval between scans - Will order CBC to be completed before next visit.  Orders: T-Vitamin D (25-Hydroxy) [id number]) T-Phosphorus ([id number])  Future Orders: T-CBC w/Diff [id number]) ... 05/15/2010  Problem # 2:  HYPERTENSION (ICD-401.9) Patient continues to have elevated blood pressure during office visits, but continues to report low/normal blood pressures at home, therefore reluctant to increase medications today.  - Will check CMET today - Will have patient fill out blood pressure logs and bring meter to next visit, can review and compare blood pressure meter with clinic meter to ensure calibrated correctly- this may be just white coat hypertension, but may be reflective of non calibrated meter.  The following medications were removed from the medication list:    Norvasc 5 Mg Tabs (Amlodipine besylate) .Marland Kitchen... Take 1 tablet by mouth once a day Her updated medication list for this problem includes:    Hydrochlorothiazide 25 Mg Tabs (Hydrochlorothiazide) .Marland Kitchen... 1 tablet daily  Amlodipine Besylate 10 Mg Tabs (Amlodipine besylate) .Marland Kitchen... Take 1 tablet by mouth once a day  Orders: T-Comprehensive Metabolic Panel ([id number])  Problem # 3:  ANXIETY (ICD-300.00) Persistent anxiety, particularly over last few months secondary to situational family stressors. States she has previously tried anti-depressive medications, which caused abdominal discomfort (however unable to specify), per clinic notes seems was recommended but did not start Cymbalta in 2010. Per patient, she expects situational stressors will resolve soon. - Will refer to social work for counseling services to help with  coping with stressors. - Will continue current dosage of Xanax at present time and reevaluate in 1 month. At that time, if acute anxiety persistent, will consider transition to longer acting benzodiazepine such as Clonazepam versus trying an SSRI or Hydroxyzine.   Her updated medication list for this problem includes:    Xanax 0.5 Mg Tabs (Alprazolam) .Marland Kitchen... 1 tab two times a day as needed Orders: Social Work Referral (Social )  Problem # 4:  GERD (ICD-530.81) Persistent epigastric pain and discomfort. Prior history of H.pylori, treated 2009. Previously tried on Omeprazole daily with successful control of abdominal pain, however she discontinued it electively. - Will restart Omprazole daily, and reassess next visit.  - Can consider repeat H.pylori testing if persistent pain.  Her updated medication list for this problem includes:    Omeprazole 40 Mg Cpdr (Omeprazole) .Marland Kitchen... Take 1 tablet by mouth once a day  Problem # 5:  Preventive Health Care (ICD-V70.0) - Will request for patient to come fasting to next office visit, with FLP ordered for next visit. - Colonoscopy/Hemoccult cards not discussed today, previously refused. Please follow-up on these screening tests at next visit.  Complete Medication List: 1)  Hydrochlorothiazide 25 Mg Tabs (Hydrochlorothiazide) .Marland Kitchen.. 1 tablet daily 2)  Xanax 0.5 Mg Tabs (Alprazolam) .Marland Kitchen.. 1 tab two times a day as needed 3)  Amlodipine Besylate 10 Mg Tabs (Amlodipine besylate) .... Take 1 tablet by mouth once a day 4)  Vitamin D-1000 [address] St [phone] Mg-unit Tabs (Calcium-vitamin d) .... Take 1 tablet by mouth once a day 5)  Fish Oil 1000 Mg Caps (Omega-3 fatty acids) .... Take 1 tablet by mouth once a day 6)  Omeprazole 40 Mg Cpdr (Omeprazole) .... Take 1 tablet by mouth once a day  Other Orders: T-T4, Free [phone]) T-TSH ([id number]) Future Orders: T-Lipid Profile ([id number]) ... 05/15/2010  Lipid Assessment/Plan:      Based on NCEP/ATP III, the  patient's risk factor category is "0-1 risk factors".  The patient's lipid goals are as follows: Total cholesterol goal is 200; LDL cholesterol goal is 130; HDL cholesterol goal is 40; Triglyceride goal is 150.    Patient Instructions: 1)  Please follow-up with your primary care provider Dr.Alvarez in 50month at which time, please bring your blood pressure logs and blood pressure meter. 2)  Please complete blood pressure logs 1-2 times daily (around same time, after sitting for ) until you follow up with Dr. Onalee Hua. 3)  You will be started on a new medication called Omeprazole for your stomach pain and reflux, take it once daily. If you have diarrhea, abdominal pain, ear pain, severe headaches with this medication, please stop it and call the clinic [phone]). 4)  You have been referred to our Social Worker, Lupita Leash, who will help to arrange for counselling services (she will contact you). 5)  I will call you regarding your old bone scan results and if you need to start an additional medication, a bisphosphonate, for  treatment of your osteopenia. 6)  You will have lab work completed today to check your thyroid, kidneys, liver. I will call you if results are abnormal. Prescriptions: OMEPRAZOLE 40 MG CPDR (OMEPRAZOLE) Take 1 tablet by mouth once a day  #30 x 3   Entered and Authorized by:   Johnette Abraham DO   Signed by:   Johnette Abraham DO on 04/09/2010   Method used:   Print then Give to Patient   RxID:   [id number] AMLODIPINE BESYLATE 10 MG TABS (AMLODIPINE BESYLATE) Take 1 tablet by mouth once a day  #30 x 6   Entered and Authorized by:   Johnette Abraham DO   Signed by:   Johnette Abraham DO on 04/09/2010   Method used:   Reprint   RxID:   [id number] HYDROCHLOROTHIAZIDE 25 MG TABS (HYDROCHLOROTHIAZIDE) 1 tablet daily  #31 x 3   Entered and Authorized by:   Johnette Abraham DO   Signed by:   Johnette Abraham DO on 04/09/2010   Method used:    Reprint   RxID:   [id number] XANAX 0.5 MG TABS (ALPRAZOLAM) 1 tab two times a day as needed  #62 x 3   Entered and Authorized by:   Johnette Abraham DO   Signed by:   Johnette Abraham DO on 04/09/2010   Method used:   Reprint   RxID:   [id number] AMLODIPINE BESYLATE 10 MG TABS (AMLODIPINE BESYLATE) Take 1 tablet by mouth once a day  #30 x 6   Entered and Authorized by:   Johnette Abraham DO   Signed by:   Johnette Abraham DO on 04/09/2010   Method used:   Print then Give to Patient   RxID:   [id number] XANAX 0.5 MG TABS (ALPRAZOLAM) 1 tab two times a day as needed  #62 x 3   Entered and Authorized by:   Johnette Abraham DO   Signed by:   Johnette Abraham DO on 04/09/2010   Method used:   Print then Give to Patient   RxID:   [id number] HYDROCHLOROTHIAZIDE 25 MG TABS (HYDROCHLOROTHIAZIDE) 1 tablet daily  #31 x 3   Entered and Authorized by:   Johnette Abraham DO   Signed by:   Johnette Abraham DO on 04/09/2010   Method used:   Print then Give to Patient   RxID:   [id number]   Prevention & Chronic Care Immunizations   Influenza vaccine: Not documented   Influenza vaccine deferral: Refused  (04/09/2010)    Tetanus booster: Not documented   Td booster deferral: Refused  (07/09/2009)    Pneumococcal vaccine: Not documented   Pneumococcal vaccine deferral: Refused  (04/09/2010)    H. zoster vaccine: Not documented   H. zoster vaccine deferral: Deferred  (07/09/2009)  Colorectal Screening   Hemoccult: Negative  (05/03/2005)   Hemoccult action/deferral: Ordered  (02/26/2009)    Colonoscopy: Not documented   Colonoscopy action/deferral: Refused  (02/26/2009)  Other Screening   Pap smear: Not documented   Pap smear action/deferral: Not indicated S/P hysterectomy  (02/26/2009)    Mammogram: ASSESSMENT: Negative - BI-RADS 1^MM DIGITAL SCREENING  (03/05/2010)   Mammogram action/deferral: Ordered   (02/26/2009)   Mammogram due: 03/06/2011    DXA bone density scan: Not documented   Smoking status: quit  (04/09/2010)  Lipids   Total Cholesterol: 242  (02/26/2009)   Lipid panel action/deferral: Lipid Panel ordered   LDL: 166  (02/26/2009)   LDL Direct: Not documented   HDL: 54  (02/26/2009)   Triglycerides:  108  (02/26/2009)    SGOT (AST): 18  (10/20/2008)   SGPT (ALT): 11  (10/20/2008) CMP ordered    Alkaline phosphatase: 91  (10/20/2008)   Total bilirubin: 0.5  (10/20/2008)  Hypertension   Last Blood Pressure: 160 / 83  (04/09/2010)   Serum creatinine: 0.86  (02/26/2009)   Serum potassium 3.6  (02/26/2009) CMP ordered     Hypertension flowsheet reviewed?: Yes   Progress toward BP goal: Unchanged   Hypertension comments: Patient reports low-normal blood pressures at home, will bring in blood pressure log and blood pressure meter next visit.  Self-Management Support :    Patient will work on the following items until the next clinic visit to reach self-care goals:     Medications and monitoring: take my medicines every day, check my blood pressure, bring all of my medications to every visit  (04/09/2010)     Eating: use fresh or frozen vegetables, eat foods that are low in salt, eat baked foods instead of fried foods  (04/09/2010)    Hypertension self-management support: BP self-monitoring log, Written self-care plan  (04/09/2010)   Hypertension self-care plan printed.    Lipid self-management support: Written self-care plan  (04/09/2010)   Lipid self-care plan printed.  Process Orders Check Orders Results:     [facility]: ABN not required for this insurance Tests Sent for requisitioning (April 14, 2010 9:13 PM):     05/15/2010: [facility] -- T-Lipid Profile [phone] (signed)     05/15/2010: [facility] -- T-CBC w/Diff [[id number]] (signed)     04/09/2010: [facility] -- T-Vitamin D  (25-Hydroxy) [phone] (signed)     04/09/2010: [facility] -- T-Phosphorus [[id number]] (signed)     04/09/2010: [facility] -- T-Comprehensive Metabolic Panel [[id number]] (signed)     04/09/2010: [facility] -- [place], [place] [[id number]] (signed)     04/09/2010: [facility] -- T-TSH [phone] (signed)

## 2010-08-29 NOTE — Progress Notes (Signed)
  Phone Note Outgoing Call   Call placed by: Soc. Work Call placed to: Patient Summary of Call: In response to Dr. Ferne Coe referral I called Ms. Blumenberg to find out if she was interested in [facility] counseling for family stress, depression and anxiety.  She declined a referral at this time and felt like she was just having a bad day and coping with a younger sister who irritates her tremendously.   It was clear that she was not a threat to herself and others and her preference is to not participate in counseling because she doesn't want to talk with anyone about her problems.   She was receptive to my sending her my card and a [facility] brochure should she need to be connected with mental health therapy.

## 2010-08-29 NOTE — Assessment & Plan Note (Signed)
Summary: EST-1 MONTH F/UV ISIT/CH   Vital Signs:  Patient profile:   65 year old female Height:      69 inches (175.26 cm) Weight:      133.02 pounds (60.46 kg) BMI:     19.71 Temp:     97.7 degrees F (36.50 degrees C) oral Pulse rate:   80 / minute BP sitting:   170 / 92  (right arm) Cuff size:   regular  Vitals Entered By: Angelina Ok RN (May 15, 2010 8:34 AM) CC: Depression Is Patient Diabetic? No Pain Assessment Patient in pain? yes     Location: all over Intensity: 9 Type: aching Onset of pain  Constant Nutritional Status BMI of 19 -24 = normal  Have you ever been in a relationship where you felt threatened, hurt or afraid?No   Does patient need assistance? Functional Status Self care Ambulation Normal Comments Check up.  Lab results.  Problems taking Norvasc.  Brought B/P log.   Primary Care Provider:  Mariea Stable MD  CC:  Depression.  History of Present Illness: [name] is a 65 yo woman with PMH as outlined in chart.  She is here for routine follow up and lab results from last visit.  She is doing well overall.  She brings a log of BP reading which remain well controlled.  Furthermore, she brings her blood pressure meter which read 153/90 in room which is the highest reading documented by that meter.    Depression History:      The patient denies a depressed mood most of the day and a diminished interest in her usual daily activities.         Preventive Screening-Counseling & Management  Alcohol-Tobacco     Alcohol drinks/day: 0     Smoking Status: quit     Year Quit: 20+ years ago  Current Medications (verified): 1)  Hydrochlorothiazide 25 Mg Tabs (Hydrochlorothiazide) .Marland Kitchen.. 1 Tablet Daily 2)  Xanax 0.5 Mg Tabs (Alprazolam) .Marland Kitchen.. 1 Tab Two Times A Day As Needed 3)  Amlodipine Besylate 10 Mg Tabs (Amlodipine Besylate) .... Take 1 Tablet By Mouth Once A Day 4)  Vitamin D-1000 Max St [phone] Mg-Unit Tabs (Calcium-Vitamin D) .... Take 1  Tablet By Mouth Once A Day 5)  Fish Oil 1000 Mg Caps (Omega-3 Fatty Acids) .... Take 1 Tablet By Mouth Once A Day  Allergies (verified): 1)  ! Codeine 2)  ! Lexapro 3)  ! Hydrocodone 4)  ! Oxybutynin Chloride  Past History:  Past Medical History: Last updated: 12/19/2008 Anxiety Depression Osteopenia (no DEXA in system) Hypertension Question of fibromyalgia (ESR, CRP, ANA, RF all wnl). Hyperlipidemia Recurrent UTI's ? (only one U/C x 2008 and it was negative)  Past Surgical History: Last updated: 04/29/2006 Hysterectomy  Family History: Last updated: 02/26/2009 Father:  alcoholic induced CM, ESRD Brother:  throat cancer 2/2 tobacco otherwise HTN  Social History: Last updated: 02/26/2009 Single. Single Former Smoker (only smoked for about 5-6 years in her youth) Alcohol use-no Drug use-no  Risk Factors: Smoking Status: quit (05/15/2010)  Review of Systems      See HPI  Physical Exam  General:  Well-developed,well-nourished,in no acute distress; alert,appropriate and cooperative throughout examination Eyes:  anicteric Lungs:  normal respiratory effort and no accessory muscle use.   Neurologic:  alert & oriented X3 and gait normal.   Psych:  Oriented X3, memory intact for recent and remote, and normally interactive.     Impression & Recommendations:  Problem #  1:  HYPERLIPIDEMIA (ICD-272.4) LDL has been elevated in past.   However, pt does not like to take medications. Discussed this issue with pt and she does not wish to start a medication at this point....b/c of this, will check in future and revisit.  Labs Reviewed: SGOT: 20 (04/09/2010)   SGPT: 17 (04/09/2010)  Lipid Goals: Chol Goal: 200 (04/09/2010)   HDL Goal: 40 (04/09/2010)   LDL Goal: 130 (04/09/2010)   TG Goal: 150 (04/09/2010)  Prior 10 Yr Risk Heart Disease: 20 % (04/09/2010)   HDL:54 (02/26/2009), 53 (01/20/2008)  LDL:166 (02/26/2009), 143 (01/20/2008)  Chol:242 (02/26/2009), 213  (01/20/2008)  Trig:108 (02/26/2009), 87 (01/20/2008)  Problem # 2:  HYPERTENSION (ICD-401.9) Blood pressure elevated again today as usual However, BP log with good values Pt's BP meter read 153/90 this in office.  Her updated medication list for this problem includes:    Hydrochlorothiazide 25 Mg Tabs (Hydrochlorothiazide) .Marland Kitchen... 1 tablet daily    Amlodipine Besylate 10 Mg Tabs (Amlodipine besylate) .Marland Kitchen... Take 1 tablet by mouth once a day  BP today: 170/92 Prior BP: 160/83 (04/09/2010)  Prior 10 Yr Risk Heart Disease: 20 % (04/09/2010)  Labs Reviewed: K+: 3.9 (04/09/2010) Creat: : 0.75 (04/09/2010)   Chol: 242 (02/26/2009)   HDL: 54 (02/26/2009)   LDL: 166 (02/26/2009)   TG: 108 (02/26/2009)  Complete Medication List: 1)  Hydrochlorothiazide 25 Mg Tabs (Hydrochlorothiazide) .Marland Kitchen.. 1 tablet daily 2)  Xanax 0.5 Mg Tabs (Alprazolam) .Marland Kitchen.. 1 tab two times a day as needed 3)  Amlodipine Besylate 10 Mg Tabs (Amlodipine besylate) .... Take 1 tablet by mouth once a day 4)  Vitamin D-1000 Max St [phone] Mg-unit Tabs (Calcium-vitamin d) .... Take 1 tablet by mouth once a day 5)  Fish Oil 1000 Mg Caps (Omega-3 fatty acids) .... Take 1 tablet by mouth once a day  Patient Instructions: 1)  Please schedule a follow-up appointment in 6 months. 2)  Doing quite well overall. 3)  Continue medications listed below.   4)  If you have any other problems, call clinic.      Orders Added: 1)  Est. Patient Level III [16109]    Prevention & Chronic Care Immunizations   Influenza vaccine: Not documented   Influenza vaccine deferral: Refused  (04/09/2010)    Tetanus booster: Not documented   Td booster deferral: Refused  (07/09/2009)    Pneumococcal vaccine: Not documented   Pneumococcal vaccine deferral: Refused  (04/09/2010)    H. zoster vaccine: Not documented   H. zoster vaccine deferral: Deferred  (07/09/2009)  Colorectal Screening   Hemoccult: Negative  (05/03/2005)   Hemoccult  action/deferral: Ordered  (02/26/2009)    Colonoscopy: Not documented   Colonoscopy action/deferral: Refused  (02/26/2009)  Other Screening   Pap smear: Not documented   Pap smear action/deferral: Not indicated S/P hysterectomy  (02/26/2009)    Mammogram: ASSESSMENT: Negative - BI-RADS 1^MM DIGITAL SCREENING  (03/05/2010)   Mammogram action/deferral: Ordered  (02/26/2009)   Mammogram due: 03/06/2011    DXA bone density scan: Not documented   Smoking status: quit  (05/15/2010)  Lipids   Total Cholesterol: 242  (02/26/2009)   Lipid panel action/deferral: Lipid Panel ordered   LDL: 166  (02/26/2009)   LDL Direct: Not documented   HDL: 54  (02/26/2009)   Triglycerides: 108  (02/26/2009)    SGOT (AST): 20  (04/09/2010)   SGPT (ALT): 17  (04/09/2010)   Alkaline phosphatase: 85  (04/09/2010)   Total bilirubin: 0.4  (04/09/2010)  Lipid flowsheet reviewed?: Yes   Progress toward LDL goal: Unchanged  Hypertension   Last Blood Pressure: 170 / 92  (05/15/2010)   Serum creatinine: 0.75  (04/09/2010)   Serum potassium 3.9  (04/09/2010)    Hypertension flowsheet reviewed?: Yes   Progress toward BP goal: Unchanged   Hypertension comments: home values well controlled.  Self-Management Support :    Patient will work on the following items until the next clinic visit to reach self-care goals:     Medications and monitoring: take my medicines every day, check my blood pressure, bring all of my medications to every visit  (05/15/2010)     Eating: drink diet soda or water instead of juice or soda, eat more vegetables, use fresh or frozen vegetables, eat foods that are low in salt, eat fruit for snacks and desserts, limit or avoid alcohol  (05/15/2010)     Activity: take a 30 minute walk every day  (05/15/2010)    Hypertension self-management support: BP self-monitoring log, Written self-care plan, Education handout, Pre-printed educational material, Resources for patients handout   (05/15/2010)   Hypertension self-care plan printed.   Hypertension education handout printed    Lipid self-management support: Written self-care plan, Education handout, Pre-printed educational material, Resources for patients handout  (05/15/2010)   Lipid self-care plan printed.   Lipid education handout printed      Resource handout printed.    Vital Signs:  Patient profile:   65 year old female Height:      69 inches (175.26 cm) Weight:      133.02 pounds (60.46 kg) BMI:     19.71 Temp:     97.7 degrees F (36.50 degrees C) oral Pulse rate:   80 / minute BP sitting:   170 / 92  (right arm) Cuff size:   regular  Vitals Entered By: Angelina Ok RN (May 15, 2010 8:34 AM)

## 2010-09-04 NOTE — Assessment & Plan Note (Signed)
Summary: acute-medication refills/cfb(alvarez)   Vital Signs:  Patient profile:   65 year old female Height:      69 inches Weight:      132.5 pounds BMI:     19.64 Temp:     97.6 degrees F oral Pulse rate:   80 / minute BP sitting:   160 / 80  (right arm)  Vitals Entered By: Filomena Jungling NT II (August 27, 2010 8:45 AM) CC: NEED REFILLS, Depression Is Patient Diabetic? No Pain Assessment Patient in pain? yes     Location: BODY ACHES Intensity: 10 Type: aching Onset of pain  Chronic Nutritional Status BMI of < 19 = underweight  Have you ever been in a relationship where you felt threatened, hurt or afraid?No   Does patient need assistance? Functional Status Self care Ambulation Normal   Primary Care Provider:  Mariea Stable MD  CC:  NEED REFILLS and Depression.  History of Present Illness: 65 y/o w with HTN ,HLD comes for refill no complaints says she will comes back in April for her regular follow up  Depression History:      The patient denies a depressed mood most of the day and a diminished interest in her usual daily activities.         Preventive Screening-Counseling & Management  Alcohol-Tobacco     Alcohol drinks/day: 0     Smoking Status: quit     Year Quit: 20+ years ago  Caffeine-Diet-Exercise     Does Patient Exercise: no  Current Medications (verified): 1)  Hydrochlorothiazide 25 Mg Tabs (Hydrochlorothiazide) .Marland Kitchen.. 1 Tablet Daily 2)  Xanax 0.5 Mg Tabs (Alprazolam) .Marland Kitchen.. 1 Tab Two Times A Day As Needed 3)  Amlodipine Besylate 10 Mg Tabs (Amlodipine Besylate) .... Take 1 Tablet By Mouth Once A Day 4)  Vitamin D-1000 Max St [phone] Mg-Unit Tabs (Calcium-Vitamin D) .... Take 1 Tablet By Mouth Once A Day 5)  Fish Oil 1000 Mg Caps (Omega-3 Fatty Acids) .... Take 1 Tablet By Mouth Once A Day  Allergies (verified): 1)  ! Codeine 2)  ! Lexapro 3)  ! Hydrocodone 4)  ! Oxybutynin Chloride  Review of Systems  The patient denies anorexia, fever,  weight loss, weight gain, vision loss, decreased hearing, hoarseness, chest pain, syncope, dyspnea on exertion, peripheral edema, prolonged cough, headaches, hemoptysis, abdominal pain, melena, hematochezia, severe indigestion/heartburn, hematuria, incontinence, genital sores, muscle weakness, suspicious skin lesions, transient blindness, difficulty walking, depression, unusual weight change, abnormal bleeding, enlarged lymph nodes, angioedema, breast masses, and testicular masses.    Physical Exam  General:  Gen: VS reveiwed, Alert, well developed, nodistress ENT: mucous membranes pink & moist. No abnormal finds in ear and nose. CVC:S1 S2 , no murmurs, no abnormal heart sounds. Lungs: Clear to auscultation B/L. No wheezes, crackles or other abnormal sounds Abdomen: soft, non distended, no tender. Normal Bowel sounds EXT: no pitting edema, no engorged veins, Pulsations normal  Neuro:alert, oriented *3, cranial nerved 2-12 intact, strenght normal in all  extremities, senstations normal to light touch.      Impression & Recommendations:  Problem # 1:  HYPERLIPIDEMIA (ICD-272.4) not fasting, will postpone FLP for next visit  Labs Reviewed: SGOT: 20 (04/09/2010)   SGPT: 17 (04/09/2010)  Lipid Goals: Chol Goal: 200 (04/09/2010)   HDL Goal: 40 (04/09/2010)   LDL Goal: 130 (04/09/2010)   TG Goal: 150 (04/09/2010)  Prior 10 Yr Risk Heart Disease: 20 % (04/09/2010)   HDL:54 (02/26/2009), 53 (01/20/2008)  LDL:166 (02/26/2009), 143 (01/20/2008)  Chol:242 (02/26/2009), 213 (01/20/2008)  Trig:108 (02/26/2009), 87 (01/20/2008)  Problem # 2:  HYPERTENSION (ICD-401.9) subptimal, patient says her BP is good otherwise and does not want me to change her BP meds today.   Her updated medication list for this problem includes:    Hydrochlorothiazide 25 Mg Tabs (Hydrochlorothiazide) .Marland Kitchen... 1 tablet daily    Amlodipine Besylate 10 Mg Tabs (Amlodipine besylate) .Marland Kitchen... Take 1 tablet by mouth once a  day  Complete Medication List: 1)  Hydrochlorothiazide 25 Mg Tabs (Hydrochlorothiazide) .Marland Kitchen.. 1 tablet daily 2)  Xanax 0.5 Mg Tabs (Alprazolam) .Marland Kitchen.. 1 tab two times a day as needed 3)  Amlodipine Besylate 10 Mg Tabs (Amlodipine besylate) .... Take 1 tablet by mouth once a day 4)  Vitamin D-1000 Max St [phone] Mg-unit Tabs (Calcium-vitamin d) .... Take 1 tablet by mouth once a day 5)  Fish Oil 1000 Mg Caps (Omega-3 fatty acids) .... Take 1 tablet by mouth once a day  Patient Instructions: 1)  Follow up with Dr. Onalee Hua in April Prescriptions: HYDROCHLOROTHIAZIDE 25 MG TABS (HYDROCHLOROTHIAZIDE) 1 tablet daily  #31 x 6   Entered and Authorized by:   Bethel Born MD   Signed by:   Bethel Born MD on 08/27/2010   Method used:   Print then Give to Patient   RxID:   [id number] XANAX 0.5 MG TABS (ALPRAZOLAM) 1 tab two times a day as needed  #62 x 6   Entered and Authorized by:   Bethel Born MD   Signed by:   Bethel Born MD on 08/27/2010   Method used:   Print then Give to Patient   RxID:   [id number]    Orders Added: 1)  Est. Patient Level III [95284]    Prevention & Chronic Care Immunizations   Influenza vaccine: Not documented   Influenza vaccine deferral: Refused  (04/09/2010)    Tetanus booster: Not documented   Td booster deferral: Refused  (07/09/2009)    Pneumococcal vaccine: Not documented   Pneumococcal vaccine deferral: Refused  (04/09/2010)    H. zoster vaccine: Not documented   H. zoster vaccine deferral: Deferred  (07/09/2009)  Colorectal Screening   Hemoccult: Negative  (05/03/2005)   Hemoccult action/deferral: Ordered  (02/26/2009)    Colonoscopy: Not documented   Colonoscopy action/deferral: Refused  (02/26/2009)  Other Screening   Pap smear: Not documented   Pap smear action/deferral: Not indicated S/P hysterectomy  (02/26/2009)    Mammogram: ASSESSMENT: Negative - BI-RADS 1^MM DIGITAL SCREENING  (03/05/2010)   Mammogram  action/deferral: Ordered  (02/26/2009)   Mammogram due: 03/06/2011    DXA bone density scan: Not documented   Smoking status: quit  (08/27/2010)  Lipids   Total Cholesterol: 242  (02/26/2009)   Lipid panel action/deferral: Lipid Panel ordered   LDL: 166  (02/26/2009)   LDL Direct: Not documented   HDL: 54  (02/26/2009)   Triglycerides: 108  (02/26/2009)    SGOT (AST): 20  (04/09/2010)   SGPT (ALT): 17  (04/09/2010)   Alkaline phosphatase: 85  (04/09/2010)   Total bilirubin: 0.4  (04/09/2010)  Hypertension   Last Blood Pressure: 160 / 80  (08/27/2010)   Serum creatinine: 0.75  (04/09/2010)   Serum potassium 3.9  (04/09/2010)  Self-Management Support :    Patient will work on the following items until the next clinic visit to reach self-care goals:     Medications and monitoring: take my medicines every day, check my blood pressure, bring all of my medications to every visit  (  08/27/2010)     Eating: drink diet soda or water instead of juice or soda, eat more vegetables, use fresh or frozen vegetables, eat foods that are low in salt, eat baked foods instead of fried foods, eat fruit for snacks and desserts  (08/27/2010)     Activity: take a 30 minute walk every day  (08/27/2010)    Hypertension self-management support: BP self-monitoring log, Written self-care plan, Education handout, Pre-printed educational material, Resources for patients handout  (05/15/2010)    Lipid self-management support: Written self-care plan, Education handout, Pre-printed educational material, Resources for patients handout  (05/15/2010)

## 2010-10-14 LAB — DIFFERENTIAL
Eosinophils Absolute: 0.1 10*3/uL (ref 0.0–0.7)
Eosinophils Relative: 2 % (ref 0–5)
Lymphocytes Relative: 21 % (ref 12–46)
Monocytes Absolute: 0.4 10*3/uL (ref 0.1–1.0)
Monocytes Relative: 8 % (ref 3–12)
Neutro Abs: 2.9 10*3/uL (ref 1.7–7.7)
Neutrophils Relative %: 68 % (ref 43–77)

## 2010-10-14 LAB — CBC
HCT: 37.2 % (ref 36.0–46.0)
Hemoglobin: 12.5 g/dL (ref 12.0–15.0)
MCV: 92.7 fL (ref 78.0–100.0)
Platelets: 169 10*3/uL (ref 150–400)
RDW: 12.3 % (ref 11.5–15.5)

## 2010-10-14 LAB — URINALYSIS, ROUTINE W REFLEX MICROSCOPIC
Bilirubin Urine: NEGATIVE
Glucose, UA: NEGATIVE mg/dL
Ketones, ur: NEGATIVE mg/dL
pH: 7.5 (ref 5.0–8.0)

## 2010-10-14 LAB — COMPREHENSIVE METABOLIC PANEL
Albumin: 4.8 g/dL (ref 3.5–5.2)
Alkaline Phosphatase: 81 U/L (ref 39–117)
BUN: 8 mg/dL (ref 6–23)
Creatinine, Ser: 0.71 mg/dL (ref 0.4–1.2)
Glucose, Bld: 118 mg/dL — ABNORMAL HIGH (ref 70–99)
Potassium: 3.4 mEq/L — ABNORMAL LOW (ref 3.5–5.1)
Total Bilirubin: 0.4 mg/dL (ref 0.3–1.2)
Total Protein: 7.7 g/dL (ref 6.0–8.3)

## 2010-10-14 LAB — URINE MICROSCOPIC-ADD ON

## 2010-10-14 LAB — URINE CULTURE

## 2010-10-14 LAB — LIPASE, BLOOD: Lipase: 39 U/L (ref 11–59)

## 2010-10-30 ENCOUNTER — Encounter: Payer: Self-pay | Admitting: Internal Medicine

## 2010-10-30 ENCOUNTER — Ambulatory Visit (INDEPENDENT_AMBULATORY_CARE_PROVIDER_SITE_OTHER): Payer: Self-pay | Admitting: Internal Medicine

## 2010-10-30 DIAGNOSIS — K219 Gastro-esophageal reflux disease without esophagitis: Secondary | ICD-10-CM

## 2010-10-30 DIAGNOSIS — IMO0001 Reserved for inherently not codable concepts without codable children: Secondary | ICD-10-CM

## 2010-10-30 DIAGNOSIS — F329 Major depressive disorder, single episode, unspecified: Secondary | ICD-10-CM

## 2010-10-30 DIAGNOSIS — I10 Essential (primary) hypertension: Secondary | ICD-10-CM

## 2010-10-30 DIAGNOSIS — E785 Hyperlipidemia, unspecified: Secondary | ICD-10-CM

## 2010-10-30 DIAGNOSIS — R079 Chest pain, unspecified: Secondary | ICD-10-CM

## 2010-10-30 LAB — CBC WITH DIFFERENTIAL/PLATELET
Eosinophils Absolute: 0.2 10*3/uL (ref 0.0–0.7)
Eosinophils Relative: 3 % (ref 0–5)
HCT: 39.8 % (ref 36.0–46.0)
Hemoglobin: 13.2 g/dL (ref 12.0–15.0)
Lymphocytes Relative: 22 % (ref 12–46)
Lymphs Abs: 1 10*3/uL (ref 0.7–4.0)
MCH: 30.3 pg (ref 26.0–34.0)
MCV: 91.5 fL (ref 78.0–100.0)
Monocytes Absolute: 0.3 10*3/uL (ref 0.1–1.0)
Neutro Abs: 3.1 10*3/uL (ref 1.7–7.7)
Neutrophils Relative %: 67 % (ref 43–77)
Platelets: 185 10*3/uL (ref 150–400)
RBC: 4.35 MIL/uL (ref 3.87–5.11)
RDW: 12.5 % (ref 11.5–15.5)
WBC: 4.5 10*3/uL (ref 4.0–10.5)

## 2010-10-30 LAB — LIPID PANEL
Cholesterol: 221 mg/dL — ABNORMAL HIGH (ref 0–200)
HDL: 61 mg/dL (ref >39)
LDL Cholesterol: 144 mg/dL — ABNORMAL HIGH (ref 0–99)
Triglycerides: 81 mg/dL (ref <150)
VLDL: 16 mg/dL (ref 0–40)

## 2010-10-30 LAB — COMPREHENSIVE METABOLIC PANEL
ALT: 16 U/L (ref 0–35)
Alkaline Phosphatase: 76 U/L (ref 39–117)
BUN: 12 mg/dL (ref 6–23)
CO2: 26 mEq/L (ref 19–32)
Calcium: 9.2 mg/dL (ref 8.4–10.5)
Glucose, Bld: 111 mg/dL — ABNORMAL HIGH (ref 70–99)
Total Bilirubin: 0.4 mg/dL (ref 0.3–1.2)
Total Protein: 7.2 g/dL (ref 6.0–8.3)

## 2010-10-30 MED ORDER — AMITRIPTYLINE HCL 25 MG PO TABS: 25.0000 mg | ORAL_TABLET | Freq: Every day | ORAL | Status: DC

## 2010-10-30 NOTE — Assessment & Plan Note (Signed)
BP always elevated in clinic but she reports good control at home and has brought meter in the past. Will not make changes as patient does not accept medications changes or additions easily (or blood work).

## 2010-10-30 NOTE — Progress Notes (Signed)
  Subjective:    Patient ID: [name], female    DOB: August 17, 1945, 65 y.o.   MRN: [id number]  HPI Here for routine follow up.  Reports left sided atypical chest pain.  Present for 3 weeks or so.  It is superficial, sore to touch.  Worse with movement on occasion.  Lasts from short duration to most of the day.  Waxes and wanes.  Feels like a pulled muscle.  Denies any consistent exertional pain, sob, palpitations, fever, cough.  Reports recently hanging mirrors on wall and requiring her to stretch while lifting.    She has a slightly depressed mood, some lack of interest, trouble sleep for quite some time and decreased appetite.   Review of Systems  Constitutional: Positive for activity change, appetite change, fatigue and unexpected weight change. Negative for fever, chills and diaphoresis.  HENT: Negative for congestion, sneezing and postnasal drip.   Eyes: Positive for visual disturbance (floaters). Negative for pain, discharge and redness.  Respiratory: Negative for cough, chest tightness, shortness of breath and wheezing.   Cardiovascular: Positive for chest pain (see HPI). Negative for palpitations.  Musculoskeletal: Positive for arthralgias.       Myalgias, unchanged  Neurological: Negative for weakness and numbness.       Objective:   Physical Exam  Constitutional: She is oriented to person, place, and time. She appears well-developed and well-nourished. No distress.  HENT:  Head: Normocephalic and atraumatic.  Eyes: Conjunctivae and EOM are normal. No scleral icterus.  Neck: Normal range of motion. Neck supple. No JVD present.  Cardiovascular: Normal rate, regular rhythm and normal heart sounds.  Exam reveals no gallop and no friction rub.   No murmur heard. Pulmonary/Chest: Effort normal and breath sounds normal. No respiratory distress. She has no wheezes. She has no rales. She exhibits tenderness.  Abdominal: Soft. Bowel sounds are normal.  Musculoskeletal:  Normal range of motion. She exhibits no edema.  Neurological: She is alert and oriented to person, place, and time.  Skin: She is not diaphoretic.  Psychiatric: Her behavior is normal. Thought content normal.          Assessment & Plan:

## 2010-10-30 NOTE — Assessment & Plan Note (Signed)
No SI Will try amitriptyline qhs to see if it helps with depression, sleep and fibromyalgia.

## 2010-10-30 NOTE — Assessment & Plan Note (Signed)
Musculoskeletal per HPI and PE. May be related to her fibromyalgia..... See above. Topical cream, tylenol and prn NSAID

## 2010-10-30 NOTE — Patient Instructions (Signed)
Follow up in 2-4 months. Start new medication for fibromyalgia/depression, if you have any side effects, call clinic and notify us. Will check labs today, if there are any problems that need addressing we will call you. May use creams or tylenol for pain discussed.  May use some ibuprofen if needed as well. If you have any problems before your next visit, call clinic.

## 2010-10-30 NOTE — Assessment & Plan Note (Signed)
Check lipids today Will start statin if similar values to previous.

## 2010-12-04 ENCOUNTER — Other Ambulatory Visit: Payer: Self-pay | Admitting: *Deleted

## 2010-12-05 NOTE — Telephone Encounter (Signed)
[facility] pharmacy was called; the pharmacist stated Alprazolam can only have 5 refills.  He looked back at the original rx; pt does have refills.

## 2010-12-05 NOTE — Telephone Encounter (Signed)
This was refilled on 08/27/2010 with 6 additional refills; please find out why the early request.

## 2010-12-13 NOTE — Discharge Summary (Signed)
[place]  Patient:    [name], [name] Visit Number: [id number] MRN: [id number]          Service Type: MED Location: [phone] Attending Physician:  Daisey Must Dictated by:   Joellyn Rued, P.A.-C. Admit Date:  09/26/2001 Discharge Date: 09/28/2001   CC:         [facility] in [place]   Referring Physician Discharge [place]  DATE OF BIRTH:  August 27, 1945  SUMMARY OF HISTORY:  [name] is a pleasant black female, 65 years of age, who presents to the hospital with at least a one-year history of daily episodes of sharp left-sided chest discomfort, primarily exertional, but occur at any time.  She has also reported some fatigue over the preceding weeks. While in church, she had recurring discomfort which resolved spontaneously. Also occurred while lying in bed associated with nausea and shortness of breath.  It would last around 15 minutes and then resolve.  She feels that it is much worse when she pushes on the area of discomfort.  [name] made Cindy come to the emergency room for evaluation.  Cindy history is notable for fibromyalgia, hypertension, sinus headaches.  LABORATORY DATA:  Admission sodium 138, potassium 3.4, BUN 13, creatinine 1.0, glucose 122.  CKs and troponins were negative for myocardial infarction.  H&H was 12.4 and 37.4, normal indices, platelets 191, wbcs 6.6.  Amylase was elevated at 160.  Hemoglobin A1c was 5.5.  Lipase was 48.  Cindy fasting lipids showed total cholesterol 210, triglycerides 59, HDL 69, LDL 129.  TSH 1.213.  Chest x-ray did not show any active disease.  EKG showed normal sinus rhythm, nonspecific ST-T wave changes.  HOSPITAL COURSE:  [name] was admitted to [facility]00 at [facility].  Overnight she did not have any further symptoms.  Enzymes and EKGs were negative for myocardial infarction.  Initial plans were for a Cardiolite; however, schedule was full.   Thus, this was finally performed on March 4 utilizing adenosine.  She did not have any EKG symptoms.  Imaging showed an EF of 64%, no ischemia.  After review, Dr. Eden Emms felt that she could be discharged home.  It is noted that while being hospitalized, she was treated for hypertension.  She did not know Cindy medications that she was taking at home for high blood pressure; thus she received HCTZ and lisinopril with good control of Cindy blood pressure.  DISCHARGE DIAGNOSES: 1. Noncardiac chest discomfort. 2. Elevated amylase level. 3. Hypertension. 4. History of fibromyalgia.  DISPOSITION:  She is discharged home.  MEDICATIONS:  She received a prescription for: 1. HCTZ 25 mg q.d. 2. Lisinopril 10 mg q.d. 3. Prevacid 30 mg q.d.  She was asked to continue aspirin 81 mg q.d. and not to take Cindy blood pressure medication that she has at home.  FOLLOW-UP:  She was asked to continue recording Cindy blood pressures at home and report to [facility] in approximately one week for follow-up.  At that time she should also have a BMP to evaluate potassium and kidney function.  DIET:  She was encouraged to continue Cindy low salt/fat diet. Dictated by:   Joellyn Rued, P.A.-C. Attending Physician:  Daisey Must DD:  09/28/01 TD:  09/28/01 Job: [id number]

## 2010-12-13 NOTE — Assessment & Plan Note (Signed)
[name]                            CARDIOLOGY OFFICE NOTE   [name], [name]              MRN:          [id number]  DATE:11/13/2006                            DOB:          [birth year]    The patient is a very pleasant 65 year old female with past medical  history of hypertension, whom we were asked to evaluate for chest pain.  Note:  She apparently had an admission for chest pain in March of 2003.  She apparently had a normal Myoview at that time with an ejection  fraction of 64%.  Also note she does have fibromyalgia.  The patient  states that she hurts all over 24 hours a day.  She also occasionally  has a pain in her chest that lasts for one to two minutes.  It is  described as a sharp pain with no radiation.  It is not pleuritic or  positional, nor is it related to food.  It resolves spontaneously.  There is no associated nausea or vomiting, shortness of breath or  diaphoresis.  She saw Dr. Darrick Stark recently for these symptoms.  We were  asked to further evaluate.  Note:  She also has dyspnea on exertion, but  there is no orthopnea, PND, pedal edema, palpitations or syncope.   Her medications include:  1. Diovan 80 mg p.o. daily.  2. Hydrochlorothiazide 25 mg p.o. daily.  3. Alprazolam 0.5 mg p.o. q.h.s.  4. Cyclobenzaprine 10 mg p.o. daily.  5. Septra.   ALLERGIES:  She has an allergy to CODEINE.   FAMILY HISTORY:  Negative for coronary artery disease.  She states that  her father is deceased and had alcoholism.   SOCIAL HISTORY:  She smoked for three years in her twenties, but  otherwise has not smoked.  She does not consume alcohol.   PAST MEDICAL HISTORY:  Significant for hypertension, but there is no  diabetes mellitus.  There is a questionable history of borderline  hyperlipidemia.  She does have a history of fibromyalgia and is treated  by Dr. Darrick Stark for this.  She has had a prior hysterectomy.   REVIEW OF SYSTEMS:   She denies any headaches, fevers or chills.  There  is no productive cough or hemoptysis.  There is no dysphagia or  odynophagia, melena or hematochezia.  There is no dysuria or hematuria.  There is no seizure activity.  There is no orthopnea, PND or pedal  edema.  There is no claudication.  Remainder of systems is negative,  other than diffuse aches and pains from her fibromyalgia.   PHYSICAL EXAM:  Her physical exam today shows a blood pressure of 150/82  and her pulse is 70.  She weighs 130 pounds.  She is well-developed and  well-nourished, in no acute distress.  SKIN:  Her skin is warm and dry.  She does not appear to be depressed  and there is no peripheral clubbing.  BACK:  Her back is normal.  HEENT:  Her HEENT is normal with normal eyelids.  NECK:  Her neck is supple with a normal upstroke bilaterally.  There are  no bruits noted.  There is no jugular venous distention.  I cannot  appreciate thyromegaly.  CHEST:  Her chest is clear to auscultation and percussion.  CARDIOVASCULAR EXAM:  There is a regular rhythm, normal S1 and S2.  There is a soft 1/6 systolic ejection murmur at the left sternal border.  S2 is preserved.  There is no S3 or S4.  Her PMI is nondisplaced.  ABDOMINAL EXAM:  Nontender, nondistended.  Positive bowel sounds, no  hepatosplenomegaly, no masses appreciated.  There is a soft midabdominal  bruit noted.  She has 2+ femoral pulses bilaterally, no bruits.  EXTREMITIES:  Show no edema.  I can palpate no cords.  She has 2+  posterior tibial pulses bilaterally.  NEUROLOGIC EXAM:  Grossly intact.   Her electrocardiogram shows a sinus rhythm at a rate of 73.  There are  minor nonspecific ST changes noted.  There is no RV conduction delay.   DIAGNOSES:  1. Atypical chest pain - her symptoms are very atypical for cardiac      etiology, but she is concerned about these, as is Dr. Darrick Stark.  She      also has risk factors.  We will plan to risk stratify with stress       Myoview.  If it shows normal perfusion, then we would not pursue      further cardiac workup.  She will continue with risk factor      modification.  2. Hypertension - her blood pressure is mildly elevated today, but      this can continue to be tracked and her Diovan can be increased, if      it continues to be elevated.  3. Hyperlipidemia - per Dr. Darrick Stark.  4. Mid-abdominal bruit - we will plan to proceed with an abdominal      ultrasound to exclude aneurysm.   If her Myoview and abdominal ultrasound are unremarkable, then I will  see her back on an as-needed basis.     Cindy Frieze Jens Som, MD, [facility]  Electronically Signed    BSC/MedQ  DD: 11/13/2006  DT: 11/13/2006  Job #: [id number]   cc:   [name], M.D.

## 2011-01-22 ENCOUNTER — Ambulatory Visit: Payer: Self-pay | Admitting: Internal Medicine

## 2011-01-28 ENCOUNTER — Other Ambulatory Visit: Payer: Self-pay | Admitting: Family Medicine

## 2011-01-28 DIAGNOSIS — Z1231 Encounter for screening mammogram for malignant neoplasm of breast: Secondary | ICD-10-CM

## 2011-02-05 ENCOUNTER — Ambulatory Visit (INDEPENDENT_AMBULATORY_CARE_PROVIDER_SITE_OTHER): Payer: Self-pay | Admitting: Internal Medicine

## 2011-02-05 ENCOUNTER — Encounter: Payer: Self-pay | Admitting: Internal Medicine

## 2011-02-05 DIAGNOSIS — D179 Benign lipomatous neoplasm, unspecified: Secondary | ICD-10-CM | POA: Insufficient documentation

## 2011-02-05 DIAGNOSIS — F329 Major depressive disorder, single episode, unspecified: Secondary | ICD-10-CM

## 2011-02-05 MED ORDER — HYDROCHLOROTHIAZIDE 25 MG PO TABS: 25.0000 mg | ORAL_TABLET | Freq: Every day | ORAL | Status: DC

## 2011-02-05 MED ORDER — AMLODIPINE BESYLATE 10 MG PO TABS: 10.0000 mg | ORAL_TABLET | Freq: Every day | ORAL | Status: DC

## 2011-02-05 MED ORDER — ALPRAZOLAM 0.5 MG PO TABS: 0.5000 mg | ORAL_TABLET | Freq: Two times a day (BID) | ORAL | Status: DC | PRN

## 2011-02-05 NOTE — Patient Instructions (Signed)
Pt to follow up in 3 months

## 2011-02-05 NOTE — Progress Notes (Signed)
  Subjective:    Patient ID: [name], female    DOB: Aug 17, 1945, 65 y.o.   MRN: [id number]  HPI  Ms. Longie [facility] for a 3 month f/u visit for her fibromyalgia, hypertension, hyperlipidemia and depression.  She reports that she is feeling better overall but the body aches continue as usual.  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  She is complaining of painless lumps on her body which have been present for decades but appear to be enlarging.  She denies any symptoms of depressed mood and is refusing to continue on Amitrityline.  She would like to have refills of her Amlodipine and HCTZ today in addition to a request for compression support hose for her legs.  Review of Systems  Constitutional: Negative for fever, fatigue and unexpected weight change.  Respiratory: Negative for shortness of breath.   Cardiovascular: Negative.  Negative for chest pain, palpitations and leg swelling.  Musculoskeletal: Positive for arthralgias.  Neurological: Negative for weakness and light-headedness.  Psychiatric/Behavioral: Negative for dysphoric mood and agitation. The patient is not nervous/anxious.        Objective:   Physical Exam  Constitutional: She appears well-developed and well-nourished. No distress.  HENT:  Head: Normocephalic and atraumatic.  Eyes: EOM are normal.  Cardiovascular: Normal rate, regular rhythm, normal heart sounds and intact distal pulses.   Pulmonary/Chest: Breath sounds normal.  Abdominal:    Musculoskeletal: She exhibits no edema.  Skin: Skin is warm and dry. She is not diaphoretic. No erythema.          Lipomas located in left abd/thigh fold, right side of pt's back and posterior thigh of left leg          Assessment & Plan:

## 2011-02-05 NOTE — Progress Notes (Signed)
  Subjective:    Patient ID: [name], female    DOB: 06-05-1946, 65 y.o.   MRN: [id number]  HPI    Review of Systems     Objective:   Physical Exam  Nursing note and vitals reviewed. Constitutional: She is oriented to person, place, and time. She appears well-developed and well-nourished. No distress.  HENT:  Head: Normocephalic and atraumatic.  Cardiovascular: Normal rate, regular rhythm and normal heart sounds.   Pulmonary/Chest: Effort normal and breath sounds normal.  Abdominal: Soft. Bowel sounds are normal. She exhibits no distension. There is no tenderness.  Musculoskeletal: She exhibits no edema and no tenderness.  Neurological: She is alert and oriented to person, place, and time.  Skin: Skin is warm and dry. No rash noted. No erythema.  Psychiatric: She has a normal mood and affect. Her behavior is normal.          Assessment & Plan:

## 2011-02-05 NOTE — Assessment & Plan Note (Signed)
Will continue to follow, pt educated on lipoma.

## 2011-02-05 NOTE — Assessment & Plan Note (Signed)
Depression improved. Stopped Amitrityline per pt request.

## 2011-03-10 ENCOUNTER — Encounter: Payer: Self-pay | Admitting: Internal Medicine

## 2011-03-10 ENCOUNTER — Ambulatory Visit (INDEPENDENT_AMBULATORY_CARE_PROVIDER_SITE_OTHER): Payer: Self-pay | Admitting: Internal Medicine

## 2011-03-10 DIAGNOSIS — IMO0001 Reserved for inherently not codable concepts without codable children: Secondary | ICD-10-CM

## 2011-03-10 DIAGNOSIS — I10 Essential (primary) hypertension: Secondary | ICD-10-CM

## 2011-03-10 MED ORDER — GABAPENTIN 100 MG PO CAPS: ORAL_CAPSULE | ORAL | Status: DC

## 2011-03-10 NOTE — Assessment & Plan Note (Addendum)
Patient's symptoms are most consistent with her fibromyalgia pain given diffuse character without localization to joints or musculature. Had a previous negative rheumatologic workup in 2008, however, there has been no repeat eval since that time. Previously failed Amitriptyline and OTC NSAIDS including Advil and Ibuprofen.  - Will check ESR to evaluate for possible polymyalgia rheumatica or other rheumatalgic diseases. - Will start Gabapentin, slowly tapering upwards on the dosing from 100mg  once daily, then twice daily, then three times daily (each for 3 days), then 200mg  TID, then 300mg , then 400mg  TID goal.  - Pt and her sister were extensively counseled regarding the Gabapentin dosing, specifically that this medication can cause drowsiness, therefore, she should take to tolerable level.  - Pt also instructed to stop dosing escalation at a level that is tolerable to her (with understanding NOT TO EXCEED the 400mg  TID for now) - No indication for narcotics given pain is likely from fibromyalgia - the patient was counseled about this.

## 2011-03-10 NOTE — Assessment & Plan Note (Signed)
BP Readings from Last 3 Encounters:  03/10/11 140/82  02/05/11 184/95  10/30/10 138/72    Basic Metabolic Panel:    Component Value Date/Time   NA 140 10/30/2010 0921   K 4.0 10/30/2010 0921   CL 103 10/30/2010 0921   CO2 26 10/30/2010 0921   BUN 12 10/30/2010 0921   CREATININE 0.85 10/30/2010 0921   CREATININE 0.75 04/09/2010 2016   GLUCOSE 111* 10/30/2010 0921   CALCIUM 9.2 10/30/2010 0921    Assessment: Hypertension Control: controlled  Progress toward goals: at goal  Barriers to meeting goals: no barriers identified   Plan: Hypertension treatment:   - continue current medications

## 2011-03-10 NOTE — Progress Notes (Signed)
  Subjective:    Patient ID: [name], female    DOB: 1945/09/12, 65 y.o.   MRN: [id number]  HPI Pt is a 65 y.o. female who  has a past medical history of Anxiety; Depression; Fibromyalgia; Hypertension; and Hyperlipidemia. and presents to clinic today for the following:    1) HTN - Patient does check blood pressure regularly at home, with average readings of 100-110s mmHg systolic. Currently taking  amlodipine (Norvase) and hydrochlorothiazide (HCTZ). denies headaches, dizziness, lightheadedness, chest pain, shortness of breath.  does not request refills today.  2) Chronic pain - Pain started several years ago, worsening over past several months, and has been constant and progressive since that time. Pain is distributed throughout her body, involves both joints and muscles. Has had prior workup including RF, ANA, SED rate that were all normal. Described as aching and sharp. Rated 10/10 in severity. Aggravated by cold, activity. Alleviated by nothing. Has previously tried Aleve and Advil regularly without improvement. Was also previously recommended Cymbalta, but did not start because afraid of side effects. Of note, she has previously had Cervical XR (09/2003) showing stable moderate degenerative changes within the cervical spine from C3 to C6 with moderate to severe bilateral bony neural foraminal narrowing at C5-6 and 2.5 mm posterior listhesis of C3 on 4 and C4 on 5. Also had Lumbar spine MRI on (02/2004) showing Congenitally narrowed spinal canal. Mild levoscoliosis. Superimposed degenerative changes with multifactorial spinal stenosis and lateral recess stenosis most notable L4-5 level followed by the L3-4 level as described above.    Review of Systems Per HPI.  Current Outpatient Medications Medication Sig  . ALPRAZolam (XANAX) 0.5 MG tablet Take 1 tablet (0.5 mg total) by mouth 2 (two) times daily as needed.  Marland Kitchen amLODipine (NORVASC) 10 MG tablet Take 1 tablet (10 mg total) by  mouth daily.  . hydrochlorothiazide 25 MG tablet Take 1 tablet (25 mg total) by mouth daily.    Allergies Codeine; Escitalopram oxalate; Hydrocodone; and Oxybutynin chloride  Past Medical History  Diagnosis Date  . Anxiety   . Depression   . Fibromyalgia     ESR, CRP, ANA and RF all wnl.  . Hypertension   . Hyperlipidemia     Past Surgical History  Procedure Date  . Abdominal hysterectomy        Objective:   Physical Exam   Filed Vitals:   03/10/11 1017  BP: 140/82  Pulse: 80  Temp: 98.2 F (36.8 C)      General: Vital signs reviewed and noted. Well-developed, well-nourished, in no acute distress; alert, appropriate and cooperative throughout examination.  Head: Normocephalic, atraumatic.  Lungs:  Normal respiratory effort. Clear to auscultation BL without crackles or wheezes.  Heart: RRR. S1 and S2 normal without gallop, murmur, or rubs.  Abdomen:  BS normoactive. Soft, Nondistended, non-tender.  No masses or organomegaly.  Extremities: No pretibial edema. Pt with diffuse tenderness to palpation over shoulder musculature, trapezius muscles, back, legs, knees, hip joints. ROM limited secondary to pain. Muscle strength 5/5. Normal DTRs. Sensation intact to light touch. Negative tinel sign of cubital and carpal tunnel BL.         Assessment & Plan:  Case and plan of care discussed with Dr. Doneen Poisson.

## 2011-03-10 NOTE — Patient Instructions (Signed)
   Please follow-up at the clinic in 3 weeks, at which time we will reevaluate your pain.  Please follow-up in the clinic sooner if needed.  Take your Gabapentin as 1 tablet x 3 days, then 1 tablet two times a day x 3 days, then 1 tablet three times a day x 3 days, then  2 tablets three times a day x 3 days, then 3 tablets three times a day x 3 days, then 4 tablets three times a day to continue. You have been started on a new medication that can cause drowsiness, do not drive or operate heavy machinery . Do not take this medication with alcohol.   If you have been started on new medication(s), and you develop throat closing, tongue swelling, rash, please stop the medication and call the clinic at [phone] and go to the ER.  If symptoms worsen, or new symptoms arise, please call the clinic or go to the ER.  Please bring all of your medications in a bag to your next visit.

## 2011-03-11 ENCOUNTER — Ambulatory Visit (HOSPITAL_COMMUNITY): Payer: Self-pay

## 2011-04-01 ENCOUNTER — Ambulatory Visit (INDEPENDENT_AMBULATORY_CARE_PROVIDER_SITE_OTHER): Payer: Self-pay | Admitting: Internal Medicine

## 2011-04-01 DIAGNOSIS — I10 Essential (primary) hypertension: Secondary | ICD-10-CM

## 2011-04-01 DIAGNOSIS — IMO0001 Reserved for inherently not codable concepts without codable children: Secondary | ICD-10-CM

## 2011-04-01 DIAGNOSIS — E785 Hyperlipidemia, unspecified: Secondary | ICD-10-CM

## 2011-04-01 MED ORDER — VENLAFAXINE HCL 37.5 MG PO TABS: 37.5000 mg | ORAL_TABLET | Freq: Two times a day (BID) | ORAL | Status: DC

## 2011-04-01 NOTE — Progress Notes (Signed)
Subjective:   Patient ID: [name] female   DOB: 1945/10/17 65 y.o.   MRN: [id number]  HPI: [name] is a 65 y.o. woman who presents today to clinic for follow up of her last appointment with Dr. Saralyn Pilar.  She was started on gabapentin at that visit for her chronic pain that is "all over."  She was diagnosed with fibromyalgia previously and has been on several medications for her pain including Cymbalta, Lexapro, Vicodin, Codeine, and Flexeril.  She has a constant problems with most of the medications that cause her to be dizzy, nauseated, and weak.  She states that she doesn't know what this gabapentin is but as soon as she started taking it she was unable to get out of bed and even use the bathroom on her own.  She was weak, tired, dizzy, nauseated, but did not vomit.  She also got a sharp headache in the back of her head and neck.  She states that it started as soon as she took the pills and only got worse as she titrated it up by the instructions she was given.  She continues to be very limited in her activities because of her pain that is "all over."  She states that the pain is all over including her shoulders, back, both legs, and arms.  She states that it is more in the muscles as opposed to the joints.  She has had a full rheumatology work up that was negative.    Past Medical History  Diagnosis Date  . Anxiety   . Depression   . Fibromyalgia     ESR, CRP, ANA and RF all wnl.  . Hypertension   . Hyperlipidemia    Current Outpatient Prescriptions  Medication Sig Dispense Refill  . ALPRAZolam (XANAX) 0.5 MG tablet Take 1 tablet (0.5 mg total) by mouth 2 (two) times daily as needed.  60 tablet  3  . amLODipine (NORVASC) 10 MG tablet Take 1 tablet (10 mg total) by mouth daily.  30 tablet  3  . gabapentin (NEURONTIN) 100 MG capsule Start at 100mg  (1 tab) once daily x 3 days, then increase to 1 tab twice daily, then increase to 1 tab three times  daily. Once you have reached 1 tab three times daily, the next increase will be 2 tabs three times daily x 3 days,  Once you have reached 2 tabs three times daily, the next increase will be 3 tabs three times daily x 3 days. Lastly, increase to 4 tabs three times daily and stay at that dose.  120 capsule  2  . hydrochlorothiazide 25 MG tablet Take 1 tablet (25 mg total) by mouth daily.  30 tablet  3   Family History  Problem Relation Age of Onset  . Alcohol abuse Father   . Kidney disease Father     ESRD  . Cardiomyopathy Father     alcohol induced CM  . Cancer Brother     head and neck (throat)  . Hypertension Other    History   Social History  . Marital Status: Divorced    Spouse Name: N/A    Number of Children: N/A  . Years of Education: N/A   Social History Main Topics  . Smoking status: Former Smoker    Types: Cigarettes    Quit date: 07/29/1963  . Smokeless tobacco: Not on file  . Alcohol Use: No  . Drug Use: No  . Sexually Active: Not on file  Other Topics Concern  . Not on file   Social History Narrative  . No narrative on file   Review of Systems: Constitutional: Denies fever, chills, diaphoresis, appetite change and fatigue.  HEENT: Denies photophobia, eye pain, redness, hearing loss, ear pain, congestion, sore throat, rhinorrhea, sneezing, mouth sores, trouble swallowing, neck pain, neck stiffness and tinnitus.   Respiratory: Denies SOB, DOE, cough, chest tightness,  and wheezing.   Cardiovascular: Denies chest pain, palpitations and leg swelling.  Gastrointestinal: Denies nausea, vomiting, abdominal pain, diarrhea, constipation, blood in stool and abdominal distention.  Genitourinary: Denies dysuria, urgency, frequency, hematuria, flank pain and difficulty urinating.  Musculoskeletal: Positive for myalgias, back pain, and arthralgias Denies joint swelling, and gait problem.  Skin: Denies pallor, rash and wound.  Neurological: Denies dizziness, seizures,  syncope, weakness, light-headedness, numbness and headaches.  Hematological: Denies adenopathy. Easy bruising, personal or family bleeding history  Psychiatric/Behavioral: Denies suicidal ideation, mood changes, confusion, nervousness, sleep disturbance and agitation  Objective:  Physical Exam: Filed Vitals:   04/01/11 0844  BP: 160/70  Pulse: 80  Temp: 98.2 F (36.8 C)  TempSrc: Oral  Height: 5\' 9"  (1.753 m)  Weight: 127 lb 14.4 oz (58.015 kg)   Constitutional: Vital signs reviewed.  Patient is a thin woman in mild acute distress from pain and cooperative with exam. Alert and oriented x3.  Head: Normocephalic and atraumatic Ear: TM normal bilaterally Mouth: no erythema or exudates, MMM Eyes: PERRL, EOMI, conjunctivae normal, No scleral icterus.  Neck: Supple, Trachea midline, restricted ROM in all planes,  No JVD, mass, thyromegaly, or carotid bruit present.  Cardiovascular: RRR, S1 normal, S2 normal, no MRG, pulses symmetric and intact bilaterally Pulmonary/Chest: CTAB, no wheezes, rales, or rhonchi Abdominal: Soft. Non-tender, non-distended, bowel sounds are normal, no masses, organomegaly, or guarding present.  GU: no CVA tenderness Musculoskeletal: there is marked tenderness to palpation over the paraspinous muscles bilaterally as well as in the large muscle groups.  There is hesitancy to movement of all joints.  Gait is shuffling with minimal movement.  No joint deformities or erythema Hematology: no cervical, inginal, or axillary adenopathy.  Neurological: A&O x3, Strength is symmetric bilaterally and appear weak but I think there is a component of lack of effort, cranial nerve II-XII are grossly intact, no focal motor deficit, sensory intact to light touch bilaterally.  Skin: Warm, dry and intact. No rash, cyanosis, or clubbing.  Psychiatric: Normal mood and flat affect. speech and behavior is normal. Judgment and thought content normal. Cognition and memory are normal.    Assessment & Plan:

## 2011-04-01 NOTE — Patient Instructions (Signed)
Stop Gabapentin  Start Venlafaxine 37.5 mg twice daily for your aches and pains.    Follow up with me in 2 weeks to see how your doing.  We will make the arrangements for physical therapy to see if that can help you feel better.

## 2011-04-02 NOTE — Assessment & Plan Note (Signed)
Lab Results  Component Value Date   CHOL 221* 10/30/2010   CHOL 242* 02/26/2009   CHOL 213* 01/20/2008   Lab Results  Component Value Date   HDL 61 10/30/2010   HDL 54 02/26/2009   HDL 53 01/20/2008   Lab Results  Component Value Date   LDLCALC 144* 10/30/2010   LDLCALC 166* 02/26/2009   LDLCALC 143* 01/20/2008   Lab Results  Component Value Date   TRIG 81 10/30/2010   TRIG 108 02/26/2009   TRIG 87 01/20/2008   Lab Results  Component Value Date   CHOLHDL 3.6 10/30/2010   CHOLHDL 4.5 Ratio 02/26/2009   CHOLHDL 4.0 Ratio 01/20/2008   No results found for this basename: LDLDIRECT   Her LDL goal is less then 130 and she is above that at her last check in April.  She is not willing to start a medication at this time.  We will continue to follow and encourage her to follow a low fat diet.

## 2011-04-02 NOTE — Assessment & Plan Note (Signed)
BP Readings from Last 3 Encounters:  04/01/11 160/70  03/10/11 140/82  02/05/11 184/95   Blood pressure in the office today is high but the patient's home meter confirms that she is well controlled at home.  There is likely some component of white coat hypertension.  We will continue to have her check her BPs at home and follow them here in the office.

## 2011-04-02 NOTE — Assessment & Plan Note (Signed)
She continues to be debilitated by all over pain.  I think there is a component of effort based weakness and hesitancy in her movements in anticipation of pain.   She has tenderness to palpation in all of the major pressure points for fibromyalgia but also in several placebo points as well.  She states that she is not depressed or anxious but I still feel there is a significant component of hyperalgesia and hysteria due to some psychiatric diagnosis.  She has been on most of the usual medications for fibromyalgia and has had side effects to most of them.  The medication choice is also limited by the fact that she is self pay.  We will try Effexor 37.5 mg BID to start and see her back in 2 weeks to see if we can titrate it up and see if it is helping her at all.  We also had a discussion and she is willing to try some physical therapy for flexibility as well as massage and electrostim to see if that can help her increase her physical activity which can also help with the constant pain.

## 2011-04-15 ENCOUNTER — Ambulatory Visit: Payer: Self-pay | Admitting: Internal Medicine

## 2011-04-22 ENCOUNTER — Ambulatory Visit (INDEPENDENT_AMBULATORY_CARE_PROVIDER_SITE_OTHER): Payer: Self-pay | Admitting: Internal Medicine

## 2011-04-22 ENCOUNTER — Encounter: Payer: Self-pay | Admitting: Internal Medicine

## 2011-04-22 DIAGNOSIS — I10 Essential (primary) hypertension: Secondary | ICD-10-CM

## 2011-04-22 DIAGNOSIS — F329 Major depressive disorder, single episode, unspecified: Secondary | ICD-10-CM

## 2011-04-22 DIAGNOSIS — IMO0001 Reserved for inherently not codable concepts without codable children: Secondary | ICD-10-CM

## 2011-04-22 MED ORDER — MIRTAZAPINE 15 MG PO TABS: 7.5000 mg | ORAL_TABLET | Freq: Every day | ORAL | Status: AC

## 2011-04-22 MED ORDER — HYDROCHLOROTHIAZIDE 25 MG PO TABS: 25.0000 mg | ORAL_TABLET | Freq: Every day | ORAL | Status: DC

## 2011-04-22 MED ORDER — AMLODIPINE BESYLATE 10 MG PO TABS: 10.0000 mg | ORAL_TABLET | Freq: Every day | ORAL | Status: DC

## 2011-04-22 NOTE — Progress Notes (Signed)
Subjective:   Patient ID: [name] female   DOB: [birth year] 65 y.o.   MRN: [id number]  HPI: [name] is a 65 y.o. woman who presents to clinic today for follow up from her last appointment.  She states that the effexor didn't work because it made her nauseated and dizzy so she stopped taking it after about 2 days.  She continues to have problems with pain all over her body.  Today when I entered the room she didn't want to shake my hand because she said they hurt so bad.  She states that she hasn't been able to sleep because she is racked by the pain in her body.  She gets 10-20 minutes at a time.  She denies SI/HI, racing thoughts or hypersomnia.    She states that she has been taking her blood pressure medications as directed.  We discussed preventative items today including the flu shot, tetanus, and colon cancer screening and she declined them all because of her pain.   Past Medical History  Diagnosis Date  . Anxiety   . Depression   . Fibromyalgia     ESR, CRP, ANA and RF all wnl.  . Hypertension   . Hyperlipidemia    Current Outpatient Prescriptions  Medication Sig Dispense Refill  . ALPRAZolam (XANAX) 0.5 MG tablet Take 1 tablet (0.5 mg total) by mouth 2 (two) times daily as needed.  60 tablet  3  . amLODipine (NORVASC) 10 MG tablet Take 1 tablet (10 mg total) by mouth daily.  30 tablet  3  . hydrochlorothiazide 25 MG tablet Take 1 tablet (25 mg total) by mouth daily.  30 tablet  3  . venlafaxine (EFFEXOR) 37.5 MG tablet Take 1 tablet (37.5 mg total) by mouth 2 (two) times daily.  60 tablet  4   Family History  Problem Relation Age of Onset  . Alcohol abuse Father   . Kidney disease Father     ESRD  . Cardiomyopathy Father     alcohol induced CM  . Cancer Brother     head and neck (throat)  . Hypertension Other    History   Social History  . Marital Status: Divorced    Spouse Name: N/A    Number of Children: N/A  . Years of Education:  N/A   Social History Main Topics  . Smoking status: Former Smoker    Types: Cigarettes    Quit date: 07/29/1963  . Smokeless tobacco: None  . Alcohol Use: No  . Drug Use: No  . Sexually Active: None   Other Topics Concern  . None   Social History Narrative  . None   Review of Systems: Negative except as noted in the HPI.   Objective:  Physical Exam: Filed Vitals:   04/22/11 0909  BP: 172/91  Pulse: 86  Temp: 98.2 F (36.8 C)  TempSrc: Oral  Height: 5\' 9"  (1.753 m)  Weight: 125 lb 9.6 oz (56.972 kg)   Constitutional: Vital signs reviewed.  Patient is a thin, anxious appearing female in mild distress from pain who is cooperative with exam. Alert and oriented x3.  Head: Normocephalic and atraumatic Ear: TM normal bilaterally Mouth: no erythema or exudates, MMM Eyes: PERRL, EOMI, conjunctivae normal, No scleral icterus.  Neck: Supple, Trachea midline normal ROM, No JVD, mass, thyromegaly, or carotid bruit present.  Cardiovascular: RRR, S1 normal, S2 normal, no MRG, pulses symmetric and intact bilaterally Pulmonary/Chest: CTAB, no wheezes, rales, or rhonchi Abdominal: Soft.  Non-tender, non-distended, bowel sounds are normal, no masses, organomegaly, or guarding present.  GU: no CVA tenderness Musculoskeletal: There is moderate tenderness down the entire back and guarding of movement in all joints.  No joint deformities, erythema, or stiffness, ROM full and no nontender Skin: Warm, dry and intact. No rash, cyanosis, or clubbing.  Psychiatric: Anxious mood and flat affect. speech is non-pressured and behavior is normal. Judgment, insight, and thought content normal. Cognition and memory are normal.   Assessment & Plan:

## 2011-04-22 NOTE — Patient Instructions (Signed)
Try the Remeron 15 mg tablets.  Start with 1/2 tablet at bedtime for 3-4 days.  If it does not help much you can take a full tablet at night to help you sleep.  Keep working on the exercises to help with your flexibility.  Follow up in about 1 month to see how you are doing.

## 2011-05-01 NOTE — Assessment & Plan Note (Signed)
I think her depression and anxiety have a great deal to do with her chronic all over pain.  She has been worked up completely and has nothing that has fallen out.  We will try the Remeron and if that doesn't work we will try trazodone or ambien to try to help her sleep.

## 2011-05-01 NOTE — Assessment & Plan Note (Signed)
Lab Results  Component Value Date   NA 140 10/30/2010   K 4.0 10/30/2010   CL 103 10/30/2010   CO2 26 10/30/2010   BUN 12 10/30/2010   CREATININE 0.85 10/30/2010   CREATININE 0.75 04/09/2010    BP Readings from Last 3 Encounters:  04/22/11 172/91  04/01/11 160/70  03/10/11 140/82    Assessment: Hypertension control:  mildly elevated  Progress toward goals:  unchanged Barriers to meeting goals:  adverse effects of medications and Patient's home meter shows normal blood pressure.  She likely has white coat hypertension.   Plan: Hypertension treatment:  continue current medications She has white coat hypertension as evidenced by her normal readings on her home blood pressure meter.  We will continue to monitor and have her bring her meter to her next appointment.

## 2011-05-01 NOTE — Assessment & Plan Note (Signed)
We have pretty much exhausted every medication that has any indication for fibromyalgia because she is unable to tolerate it.  Our plan today is to try to tackle the sleep problem and hope that increasing her sleep will help her feel better in general.  We will start Remeron 7.5 mg qhs and increase it to 15 mg Qhs in a few days.  She will call if she continues to have problems with the medication.

## 2011-05-29 ENCOUNTER — Encounter: Payer: Self-pay | Admitting: Internal Medicine

## 2011-05-29 ENCOUNTER — Ambulatory Visit (INDEPENDENT_AMBULATORY_CARE_PROVIDER_SITE_OTHER): Payer: Self-pay | Admitting: Internal Medicine

## 2011-05-29 DIAGNOSIS — IMO0001 Reserved for inherently not codable concepts without codable children: Secondary | ICD-10-CM

## 2011-05-29 DIAGNOSIS — G4700 Insomnia, unspecified: Secondary | ICD-10-CM

## 2011-05-29 DIAGNOSIS — I10 Essential (primary) hypertension: Secondary | ICD-10-CM

## 2011-05-29 DIAGNOSIS — F3289 Other specified depressive episodes: Secondary | ICD-10-CM

## 2011-05-29 DIAGNOSIS — F329 Major depressive disorder, single episode, unspecified: Secondary | ICD-10-CM

## 2011-05-29 MED ORDER — ALPRAZOLAM 0.5 MG PO TABS: 0.5000 mg | ORAL_TABLET | Freq: Two times a day (BID) | ORAL | Status: DC | PRN

## 2011-05-29 MED ORDER — ZOLPIDEM TARTRATE 10 MG PO TABS: 10.0000 mg | ORAL_TABLET | Freq: Every evening | ORAL | Status: DC | PRN

## 2011-05-29 MED ORDER — HYDROCHLOROTHIAZIDE 25 MG PO TABS: 25.0000 mg | ORAL_TABLET | Freq: Every day | ORAL | Status: DC

## 2011-05-29 NOTE — Assessment & Plan Note (Addendum)
Pt with continued depressed mood and flat affect.  She reports only trying 1 dose of the Remeron which caused her to vomit.  She denies SI but expressed to her sister that she is tired of "fighting this" which caused her sister great distress.  Of note, the patient has her own residence but has been frequently staying with her sister who has been assisting her with meals and medications.  She  continues to endorse decreased appetite reporting that she had a couple of shrimp last night.  She has tried Cymbalta and Lexapro in the past.  Plan is to focus on sleep aspect which appears to be a major part of her depressed mood today.

## 2011-05-29 NOTE — Assessment & Plan Note (Addendum)
Patient is having worsening insomnia with reports of sleeping in stretches of about 20 minutes at a time.  She denies additional stressors in life but reports that the worsening fibromyalgia pain prevents her from resting.  Plan is to start a dedicated sleep medication of Ambien 10 mg po qhs.  I have advised her to continue to take it even if it upsets her stomach for at least several days.  She and her sister are in agreement with this plan and will f/u in 3-4 weeks.

## 2011-05-29 NOTE — Progress Notes (Signed)
  Subjective:    Patient ID: [name], female    DOB: [birth year], 65 y.o.   MRN: [id number]  HPI Ms. Bellomo presents today for f/u after last visit with Dr. Tonny Branch on 04/22/2011 for fibromyalgia, depression and insomnia.  She presents with her sister with continued complaints of diffuse pain especially along her entire back, hand fullness, insomnia and decreased appetite.  She has had a full rheumatologic work-up that has been negative and has tried Cymbalta, Lexapro, Vicodin, Codeine, and Flexeril.  She was initiated on Remeron 15 mg qhs  last month but reports that it caused vomiting after one dose. She and her sister reports that she has not been sleeping for more than an hour at a time and ususally in spurts of 10-20 minutes.  She told her sister that she feels as though she wants to "just give up" but denies suicidal ideation.  She is also requesting refills of Alprazolam and HCTZ today and declines the flu shot today.   Review of Systems  Constitutional: Positive for activity change, appetite change and fatigue. Negative for fever, chills and diaphoresis.  Respiratory: Negative for cough, chest tightness and shortness of breath.   Cardiovascular: Negative for chest pain and leg swelling.  Genitourinary: Negative.   Musculoskeletal: Positive for back pain and arthralgias. Negative for joint swelling.  Neurological: Positive for dizziness, weakness, light-headedness, numbness and headaches. Negative for tremors and speech difficulty.  Psychiatric/Behavioral: Positive for sleep disturbance and dysphoric mood. Negative for suicidal ideas and self-injury. The patient is not nervous/anxious.        Objective:   Physical Exam  Constitutional: She is oriented to person, place, and time.       Sitting in chair with sister by her side, decreased eye contact, no acute distress   HENT:  Head: Normocephalic and atraumatic.  Mouth/Throat: Oropharynx is clear and moist.  Eyes:  Pupils are equal, round, and reactive to light.  Neck: Neck supple. No thyromegaly present.  Cardiovascular: Normal rate, regular rhythm and normal heart sounds.   No murmur heard. Pulmonary/Chest: No respiratory distress. She has no wheezes. She exhibits tenderness.       Tenderness in the upper chest (clavicular area)  Abdominal: Soft.  Musculoskeletal: Normal range of motion. She exhibits tenderness.       Tenderness at essentially every palpation point paraspinal, along flanks, and below the waist including quadriceps.  Neurological: She is alert and oriented to person, place, and time.  Skin: Skin is warm and dry. No rash noted. No erythema.  Psychiatric: Judgment and thought content normal.       Flat affect, depressed mood          Assessment & Plan:

## 2011-05-29 NOTE — Assessment & Plan Note (Addendum)
Reports good bp  control at home and per Dr. Donnelly Stager last note patient had normal bp on home meter thus likely white coat hypertension and pain contributing to elevated bp today.  Will defer any adjustments at this time and will refill HCTZ.

## 2011-05-29 NOTE — Assessment & Plan Note (Addendum)
She has been through many different medications to alleviate her symptoms including Flexeril, Vicodin, Codeine and antidepressants all of which cause some level of GI intolerance or dizziness.  She continues to have diffuse, symmetric and chronic MSK pain including axial skeletal pain above and below the waist.  There are no signs of myositis or inflammatory arthritis today.  She reports that the pain and tenderness is preventing her from sleeping.  We will try to improve the sleep component of her constellation of symptoms today with instructions concerning proper sleep hygiene and a trial of Ambien 10 mg qhs. She will be eligible for Medicare Part D in December when she turns 22 which will help with the cost of medications. May consider referral to Pain Clinic.

## 2011-05-29 NOTE — Patient Instructions (Addendum)
Please continue to take your medications as prescribed.  Our biggest concern today is your lack of sleep so we will try Ambien.  Please eat a little something right before taking it.  If it makes you sick on your stomach, please continue to try it for several more days.  I will like to see you back in 3-4 works.  If your mood starts to worsen, please call the clinic to be evaluated sooner.

## 2011-05-30 NOTE — Progress Notes (Signed)
agree with plans and note. 

## 2011-06-11 ENCOUNTER — Encounter: Payer: Self-pay | Admitting: Family Medicine

## 2011-06-11 ENCOUNTER — Ambulatory Visit (INDEPENDENT_AMBULATORY_CARE_PROVIDER_SITE_OTHER): Payer: Self-pay | Admitting: Family Medicine

## 2011-06-11 VITALS — BP 162/90 | HR 93 | Temp 98.9°F | Ht 68.5 in | Wt 118.0 lb

## 2011-06-11 DIAGNOSIS — IMO0001 Reserved for inherently not codable concepts without codable children: Secondary | ICD-10-CM

## 2011-06-11 DIAGNOSIS — G4700 Insomnia, unspecified: Secondary | ICD-10-CM

## 2011-06-11 DIAGNOSIS — F329 Major depressive disorder, single episode, unspecified: Secondary | ICD-10-CM

## 2011-06-11 DIAGNOSIS — I10 Essential (primary) hypertension: Secondary | ICD-10-CM

## 2011-06-11 MED ORDER — PREDNISONE 10 MG PO TABS: 10.0000 mg | ORAL_TABLET | Freq: Every day | ORAL | Status: AC

## 2011-06-11 NOTE — Progress Notes (Signed)
  Subjective:    Patient ID: [name], female    DOB: 1946/02/06, 65 y.o.   MRN: [id number]  HPI 65 yr old female to establish with [place] after transfering from the [facility]. Her main complaint is chronic severe aches and pains over her entire body from the top of her head to her feet. She describes stiffness and pain in all her joints but also pains in all of her muscles. This has been going on for at least 10 years, and she has been diagnosed with fibromyalgia and depression. She has had numerous workups including CBC, ESR, RF, CK. ANA, and other rheumatologic labs, and these have all been normal. She has never seen a Rheumatologist however. She also has chronic depression, and she cannot say which came first, the depression or the pains. No joint swelling or redness or warmth. She has been tried on Cymbalta, Lexapro, and other depression meds, none of which have helped. She has been tried on Vicodin, Ibuprofen, Meloxicam, Flexeril, and Gabapentin, none of which have helped. She is accompanied today by her sister. She checks her BP frequently at home and it is always normal, around 120s over 70s, but it is often high at the doctor's office. She has never been tried on Prednisone apparently.    Review of Systems  Constitutional: Negative.   Respiratory: Negative.   Cardiovascular: Negative.   Gastrointestinal: Negative.   Musculoskeletal: Positive for myalgias and arthralgias. Negative for joint swelling.  Psychiatric/Behavioral: Positive for dysphoric mood and decreased concentration.       Objective:   Physical Exam  Constitutional: She appears well-developed and well-nourished.  Neck: No thyromegaly present.  Cardiovascular: Normal rate, regular rhythm, normal heart sounds and intact distal pulses.   Pulmonary/Chest: Effort normal and breath sounds normal.  Musculoskeletal: She exhibits no edema.       She winces in pain with the slightest grip pressure  during a handshake  Lymphadenopathy:    She has no cervical adenopathy.  Psychiatric: Her behavior is normal. Thought content normal.       Depressed affect with good eye contact           Assessment & Plan:  Probable fibromyalgia with a sigificant overlay of depression. We will try low dose Prednisone at 10 mg a day. Refer to Rheumatology. Stay on her current meds.

## 2011-06-23 ENCOUNTER — Encounter: Payer: Self-pay | Admitting: Internal Medicine

## 2011-07-28 ENCOUNTER — Ambulatory Visit (INDEPENDENT_AMBULATORY_CARE_PROVIDER_SITE_OTHER): Payer: Medicare PPO | Admitting: Family Medicine

## 2011-07-28 ENCOUNTER — Encounter: Payer: Self-pay | Admitting: Family Medicine

## 2011-07-28 VITALS — BP 158/92 | HR 89 | Temp 99.2°F | Wt 126.0 lb

## 2011-07-28 DIAGNOSIS — I10 Essential (primary) hypertension: Secondary | ICD-10-CM

## 2011-07-28 DIAGNOSIS — IMO0001 Reserved for inherently not codable concepts without codable children: Secondary | ICD-10-CM

## 2011-07-28 DIAGNOSIS — F329 Major depressive disorder, single episode, unspecified: Secondary | ICD-10-CM

## 2011-07-28 DIAGNOSIS — M797 Fibromyalgia: Secondary | ICD-10-CM

## 2011-07-28 MED ORDER — AMLODIPINE BESYLATE 10 MG PO TABS: 10.0000 mg | ORAL_TABLET | Freq: Every day | ORAL | Status: DC

## 2011-07-28 MED ORDER — ALPRAZOLAM 0.5 MG PO TABS: 0.5000 mg | ORAL_TABLET | Freq: Two times a day (BID) | ORAL | Status: DC | PRN

## 2011-07-28 MED ORDER — HYDROCHLOROTHIAZIDE 25 MG PO TABS: 25.0000 mg | ORAL_TABLET | Freq: Every day | ORAL | Status: DC

## 2011-07-28 MED ORDER — PREDNISONE 10 MG PO TABS: 10.0000 mg | ORAL_TABLET | Freq: Every day | ORAL | Status: DC

## 2011-07-28 NOTE — Progress Notes (Signed)
  Subjective:    Patient ID: [name], female    DOB: [birth year], 65 y.o.   MRN: [id number]  HPI Here to follow up on fibromyalgia and depression. We met her 6 weeks ago and we tried her on low dose Prednisone for the first time. She has had a dramatic improvement, and is thrilled with how she feels. She has less stiffness and pain, as well as a lot more energy. She is sleeping better, and is more active. Her sister who accompanies her today says she is actually getting out of the house and going places for the first time in over a year. She is cleaning her own house, cooking meals, etc.    Review of Systems  Constitutional: Negative.   Respiratory: Negative.   Cardiovascular: Negative.   Musculoskeletal: Negative.   Psychiatric/Behavioral: Negative.        Objective:   Physical Exam  Constitutional: She appears well-developed and well-nourished.  Cardiovascular: Normal rate, regular rhythm, normal heart sounds and intact distal pulses.   Pulmonary/Chest: Effort normal and breath sounds normal.  Musculoskeletal: Normal range of motion. She exhibits no edema and no tenderness.  Psychiatric: She has a normal mood and affect. Her behavior is normal. Thought content normal.          Assessment & Plan:  Her depression and anxiety are stable, and her fibromyalgia has responded quite well to Prednisone. We will keep her daily dose at 10 mg for now. Recheck in 90 days

## 2011-10-15 ENCOUNTER — Encounter: Payer: Self-pay | Admitting: Family Medicine

## 2011-10-15 ENCOUNTER — Ambulatory Visit (INDEPENDENT_AMBULATORY_CARE_PROVIDER_SITE_OTHER): Payer: Medicare PPO | Admitting: Family Medicine

## 2011-10-15 VITALS — BP 150/90 | HR 84 | Temp 98.5°F | Wt 133.0 lb

## 2011-10-15 DIAGNOSIS — M797 Fibromyalgia: Secondary | ICD-10-CM

## 2011-10-15 DIAGNOSIS — IMO0001 Reserved for inherently not codable concepts without codable children: Secondary | ICD-10-CM

## 2011-10-15 DIAGNOSIS — I10 Essential (primary) hypertension: Secondary | ICD-10-CM

## 2011-10-15 LAB — CBC WITH DIFFERENTIAL/PLATELET
Basophils Relative: 0.2 % (ref 0.0–3.0)
Eosinophils Relative: 0.3 % (ref 0.0–5.0)
MCV: 95 fl (ref 78.0–100.0)
Monocytes Absolute: 0.6 10*3/uL (ref 0.1–1.0)
Monocytes Relative: 6.4 % (ref 3.0–12.0)
Neutrophils Relative %: 79.5 % — ABNORMAL HIGH (ref 43.0–77.0)
RBC: 4.06 Mil/uL (ref 3.87–5.11)
WBC: 8.7 10*3/uL (ref 4.5–10.5)

## 2011-10-15 LAB — POCT URINALYSIS DIPSTICK
Bilirubin, UA: NEGATIVE
Glucose, UA: NEGATIVE
Nitrite, UA: NEGATIVE
Spec Grav, UA: 1.02

## 2011-10-15 LAB — LIPID PANEL
HDL: 76.6 mg/dL (ref >39.00)
Total CHOL/HDL Ratio: 3
Triglycerides: 81 mg/dL (ref 0.0–149.0)

## 2011-10-15 LAB — TSH: TSH: 0.36 u[IU]/mL (ref 0.35–5.50)

## 2011-10-15 LAB — HEPATIC FUNCTION PANEL
Albumin: 3.9 g/dL (ref 3.5–5.2)
Alkaline Phosphatase: 65 U/L (ref 39–117)
Bilirubin, Direct: 0 mg/dL (ref 0.0–0.3)

## 2011-10-15 LAB — BASIC METABOLIC PANEL
Calcium: 9 mg/dL (ref 8.4–10.5)
GFR: 93.85 mL/min (ref >60.00)
Glucose, Bld: 88 mg/dL (ref 70–99)
Potassium: 3.4 mEq/L — ABNORMAL LOW (ref 3.5–5.1)
Sodium: 140 mEq/L (ref 135–145)

## 2011-10-15 NOTE — Progress Notes (Signed)
  Subjective:    Patient ID: [name], female    DOB: 23-Jul-1946, 66 y.o.   MRN: [id number]  HPI Here for follow up. She feels fine except for some baseline muscle aches. Her BP is stable at home in the range of 120s over 80s.    Review of Systems  Constitutional: Negative.   Respiratory: Negative.   Cardiovascular: Negative.        Objective:   Physical Exam  Constitutional: She appears well-developed and well-nourished.  Cardiovascular: Normal rate, regular rhythm, normal heart sounds and intact distal pulses.   Pulmonary/Chest: Effort normal and breath sounds normal.  Musculoskeletal: She exhibits no edema.  Lymphadenopathy:    She has no cervical adenopathy.          Assessment & Plan:  She seems to be doing well. She has put on 7 lbs, and I suspect the prednisone has played a role. She will exercise more. Get labs today

## 2011-10-16 MED ORDER — CIPROFLOXACIN HCL 500 MG PO TABS: 500.0000 mg | ORAL_TABLET | Freq: Two times a day (BID) | ORAL | Status: AC

## 2011-10-16 NOTE — Progress Notes (Signed)
Quick Note:  Pt aware. Rx sent to pharmacy. ______ 

## 2011-10-16 NOTE — Progress Notes (Signed)
Addended by: Azucena Freed on: 10/16/2011 03:59 PM   Modules accepted: Orders

## 2011-10-20 ENCOUNTER — Telehealth: Payer: Self-pay | Admitting: Family Medicine

## 2011-10-20 MED ORDER — SULFAMETHOXAZOLE-TRIMETHOPRIM 800-160 MG PO TABS: 1.0000 | ORAL_TABLET | Freq: Two times a day (BID) | ORAL | Status: AC

## 2011-10-20 NOTE — Telephone Encounter (Signed)
Pt called and said that ciprofloxacin (CIPRO) 500 MG tablet is making pt sick to her stomach, nausea. Pt is drinking cranberry juice. Pt is wondering if Dr Clent Ridges could call in an older med that pt used to take for uti, that had sulphur in it. Pls call in to [facility] on [address].

## 2011-10-20 NOTE — Telephone Encounter (Signed)
Script sent e-scribe and spoke with pt. 

## 2011-10-20 NOTE — Telephone Encounter (Signed)
Switch to Bactrim DS to take bid, call in #[phone]

## 2011-11-19 ENCOUNTER — Encounter: Payer: Self-pay | Admitting: Family Medicine

## 2011-11-19 ENCOUNTER — Ambulatory Visit (INDEPENDENT_AMBULATORY_CARE_PROVIDER_SITE_OTHER): Payer: Medicare PPO | Admitting: Family Medicine

## 2011-11-19 VITALS — BP 150/78 | HR 91 | Temp 98.7°F | Wt 139.0 lb

## 2011-11-19 DIAGNOSIS — I10 Essential (primary) hypertension: Secondary | ICD-10-CM

## 2011-11-19 DIAGNOSIS — R252 Cramp and spasm: Secondary | ICD-10-CM

## 2011-11-19 DIAGNOSIS — R29898 Other symptoms and signs involving the musculoskeletal system: Secondary | ICD-10-CM

## 2011-11-19 MED ORDER — POTASSIUM CHLORIDE ER 10 MEQ PO TBCR: 10.0000 meq | EXTENDED_RELEASE_TABLET | Freq: Two times a day (BID) | ORAL | Status: DC
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
## 2012-01-21 ENCOUNTER — Ambulatory Visit (INDEPENDENT_AMBULATORY_CARE_PROVIDER_SITE_OTHER): Payer: Medicare PPO | Admitting: Family Medicine

## 2012-01-21 ENCOUNTER — Encounter: Payer: Self-pay | Admitting: Family Medicine

## 2012-01-21 VITALS — BP 132/88 | HR 87 | Temp 98.7°F | Wt 140.0 lb

## 2012-01-21 DIAGNOSIS — E876 Hypokalemia: Secondary | ICD-10-CM

## 2012-01-21 DIAGNOSIS — I10 Essential (primary) hypertension: Secondary | ICD-10-CM

## 2012-01-21 MED ORDER — POTASSIUM CHLORIDE 20 MEQ PO PACK: 20.0000 meq | PACK | Freq: Every day | ORAL | Status: DC

## 2012-01-21 NOTE — Progress Notes (Signed)
  Subjective:    Patient ID: [name], female    DOB: [birth year], 66 y.o.   MRN: [id number]  HPI Here to follow up on HTN and muscle cramps in the arms and hands. We saw her for this 2 months ago, and her potassium was found to be slightly low. She has been on supplementation since then, and she feels great. The cramps are gone. However she has trouble getting the potassium pills down.    Review of Systems  Constitutional: Negative.   Respiratory: Negative.   Cardiovascular: Negative.   Musculoskeletal: Negative.        Objective:   Physical Exam  Constitutional: She appears well-developed and well-nourished.  Cardiovascular: Normal rate, regular rhythm, normal heart sounds and intact distal pulses.   Pulmonary/Chest: Effort normal and breath sounds normal.          Assessment & Plan:  We will switch to potassium packets to make them easier to take. Otherwise continue current meds

## 2012-02-12 ENCOUNTER — Encounter: Payer: Self-pay | Admitting: Family Medicine

## 2012-02-12 ENCOUNTER — Ambulatory Visit (INDEPENDENT_AMBULATORY_CARE_PROVIDER_SITE_OTHER): Payer: Medicare PPO | Admitting: Family Medicine

## 2012-02-12 VITALS — BP 142/90 | HR 99 | Temp 98.5°F | Wt 140.0 lb

## 2012-02-12 DIAGNOSIS — F329 Major depressive disorder, single episode, unspecified: Secondary | ICD-10-CM

## 2012-02-12 DIAGNOSIS — IMO0001 Reserved for inherently not codable concepts without codable children: Secondary | ICD-10-CM

## 2012-02-12 DIAGNOSIS — I10 Essential (primary) hypertension: Secondary | ICD-10-CM

## 2012-02-12 MED ORDER — PREDNISONE 10 MG PO TABS: 5.0000 mg | ORAL_TABLET | Freq: Every day | ORAL | Status: DC

## 2012-02-12 MED ORDER — ALPRAZOLAM 0.5 MG PO TABS: 0.5000 mg | ORAL_TABLET | Freq: Two times a day (BID) | ORAL | Status: DC | PRN

## 2012-02-12 NOTE — Progress Notes (Signed)
  Subjective:    Patient ID: [name], female    DOB: 22-Sep-1945, 66 y.o.   MRN: [id number]  HPI Here for med refills and to ask questions. She has been having some bruising on the arms and legs lately. She feels well in general but her BP has edged up a little. She has been taking 10 mg a day of prednisone for her fibromyalgia pains. This is effective, but she has gained some weight on this.    Review of Systems  Constitutional: Negative.   Respiratory: Negative.   Cardiovascular: Negative.        Objective:   Physical Exam  Constitutional: She appears well-developed and well-nourished.  Neck: No thyromegaly present.  Cardiovascular: Normal rate, regular rhythm, normal heart sounds and intact distal pulses.   Pulmonary/Chest: Effort normal and breath sounds normal.  Lymphadenopathy:    She has no cervical adenopathy.  Skin: Skin is warm and dry. No rash noted. No erythema. No pallor.  Psychiatric: She has a normal mood and affect. Her behavior is normal. Thought content normal.          Assessment & Plan:  Her anxiety is stable, so we will refill Xanax. Her weight gain and the bruising is probably side effects of her prednisone, so we will decrease her dose to 1/2 tablet (5mg ) a day. Recheck one month

## 2012-05-21 ENCOUNTER — Encounter: Payer: Self-pay | Admitting: Family Medicine

## 2012-05-21 ENCOUNTER — Ambulatory Visit (INDEPENDENT_AMBULATORY_CARE_PROVIDER_SITE_OTHER): Payer: Medicare PPO | Admitting: Family Medicine

## 2012-05-21 VITALS — BP 138/78 | Temp 98.1°F | Wt 140.0 lb

## 2012-05-21 DIAGNOSIS — R1013 Epigastric pain: Secondary | ICD-10-CM

## 2012-05-21 DIAGNOSIS — Z0000 Encounter for general adult medical examination without abnormal findings: Secondary | ICD-10-CM

## 2012-05-21 MED ORDER — OMEPRAZOLE 40 MG PO CPDR: 40.0000 mg | DELAYED_RELEASE_CAPSULE | Freq: Every day | ORAL | Status: DC

## 2012-05-21 NOTE — Progress Notes (Signed)
  Subjective:    Patient ID: [name], female    DOB: Oct 08, 1945, 66 y.o.   MRN: [id number]  HPI Here for 2 weeks of epigastric pains. These come and go. Her appetite is normal, and the pain gets better when she eats. Her BMs are normal. No nausea or fever. She has never had a colonoscopy.    Review of Systems  Constitutional: Negative.   Gastrointestinal: Positive for abdominal pain. Negative for nausea, vomiting, diarrhea, constipation, blood in stool and abdominal distention.       Objective:   Physical Exam  Constitutional: She appears well-developed and well-nourished.  Abdominal: Soft. Bowel sounds are normal. She exhibits no distension and no mass. There is no rebound and no guarding.       Mildly tender in the epigastrium           Assessment & Plan:  Possible GERD. Try Omeprazole. Set up a colonoscopy.

## 2012-06-28 ENCOUNTER — Ambulatory Visit (AMBULATORY_SURGERY_CENTER): Payer: Medicare PPO | Admitting: *Deleted

## 2012-06-28 VITALS — Ht 69.0 in | Wt 142.0 lb

## 2012-06-28 DIAGNOSIS — Z1211 Encounter for screening for malignant neoplasm of colon: Secondary | ICD-10-CM

## 2012-06-28 MED ORDER — MOVIPREP 100 G PO SOLR: ORAL | Status: DC

## 2012-06-29 ENCOUNTER — Telehealth: Payer: Self-pay | Admitting: *Deleted

## 2012-06-29 NOTE — Telephone Encounter (Signed)
Called pt to ask if she could r/s her appt for procedure so Dr Rhea Belton could do a procedure at [facility]. Pt agreed and will change the time on her instructions. She was moved up to 10:30am on 07/12/12.

## 2012-07-12 ENCOUNTER — Encounter: Payer: Medicare PPO | Admitting: Internal Medicine

## 2012-07-12 ENCOUNTER — Encounter: Payer: Self-pay | Admitting: Internal Medicine

## 2012-07-12 ENCOUNTER — Ambulatory Visit (AMBULATORY_SURGERY_CENTER): Payer: Medicare PPO | Admitting: Internal Medicine

## 2012-07-12 VITALS — BP 147/81 | HR 69 | Temp 97.2°F | Resp 20 | Ht 69.0 in | Wt 142.0 lb

## 2012-07-12 DIAGNOSIS — Z1211 Encounter for screening for malignant neoplasm of colon: Secondary | ICD-10-CM

## 2012-07-12 HISTORY — PX: COLONOSCOPY: SHX174

## 2012-07-12 MED ORDER — SODIUM CHLORIDE 0.9 % IV SOLN: 500.0000 mL | INTRAVENOUS | Status: DC

## 2012-07-12 NOTE — Op Note (Signed)
[facility] [address] [place], [address]   COLONOSCOPY PROCEDURE REPORT  PATIENT: Cindy, Stark  MR#: [id number] BIRTHDATE: 1946-04-28 , 66  yrs. old GENDER: Female ENDOSCOPIST: Beverley Fiedler, MD REFERRED BY:Fry, Jeannett Senior PROCEDURE DATE:  07/12/2012 PROCEDURE:   Colonoscopy, screening ASA CLASS:   Class II INDICATIONS:first colonoscopy and average risk screening. MEDICATIONS: MAC sedation, administered by CRNA and propofol (Diprivan) 200mg  IV  DESCRIPTION OF PROCEDURE:   After the risks benefits and alternatives of the procedure were thoroughly explained, informed consent was obtained.  A digital rectal exam revealed no rectal mass.   The     endoscope was introduced through the anus and advanced to the terminal ileum which was intubated for a short distance. No adverse events experienced.   The quality of the prep was good, using MoviPrep  The instrument was then slowly withdrawn as the colon was fully examined.    COLON FINDINGS: The mucosa appeared normal in the terminal ileum. A normal appearing cecum, ileocecal valve, and appendiceal orifice were identified.  The ascending, hepatic flexure, transverse, splenic flexure, descending, sigmoid colon and rectum appeared unremarkable.  No polyps or cancers were seen.   Retroflexed views revealed internal hemorrhoids. The time to cecum=2 minutes 11 seconds.  Withdrawal time=10 minutes 39 seconds.  The scope was withdrawn and the procedure completed.  COMPLICATIONS: There were no complications.  ENDOSCOPIC IMPRESSION: 1.   Normal mucosa in the terminal ileum 2.   Normal colon 3.   Small internal hemorrhoids  RECOMMENDATIONS: You should continue to follow colorectal cancer screening guidelines for "routine risk" patients with a repeat colonoscopy in 10 years. There is no need for FOBT (stool) testing for at least 5 years.   eSigned:  Beverley Fiedler, MD 07/12/2012 11:36 AM   cc: Nelwyn Salisbury, MD and The Patient
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
## 2012-07-12 NOTE — Patient Instructions (Addendum)
Normal colon exam today!! Small hemorrhoids seen,see handouts given. Continue current medications. Repeat colonoscopy in 10 years. Call us with any questions or concerns. Thank you!!  YOU HAD AN ENDOSCOPIC PROCEDURE TODAY AT THE [facility]: Refer to the procedure report that was given to you for any specific questions about what was found during the examination.  If the procedure report does not answer your questions, please call your gastroenterologist to clarify.  If you requested that your care partner not be given the details of your procedure findings, then the procedure report has been included in a sealed envelope for you to review at your convenience later.  YOU SHOULD EXPECT: Some feelings of bloating in the abdomen. Passage of more gas than usual.  Walking can help get rid of the air that was put into your GI tract during the procedure and reduce the bloating. If you had a lower endoscopy (such as a colonoscopy or flexible sigmoidoscopy) you may notice spotting of blood in your stool or on the toilet paper. If you underwent a bowel prep for your procedure, then you may not have a normal bowel movement for a few days.  DIET: Your first meal following the procedure should be a light meal and then it is ok to progress to your normal diet.  A half-sandwich or bowl of soup is an example of a good first meal.  Heavy or fried foods are harder to digest and may make you feel nauseous or bloated.  Likewise meals heavy in dairy and vegetables can cause extra gas to form and this can also increase the bloating.  Drink plenty of fluids but you should avoid alcoholic beverages for 24 hours.  ACTIVITY: Your care partner should take you home directly after the procedure.  You should plan to take it easy, moving slowly for the rest of the day.  You can resume normal activity the day after the procedure however you should NOT DRIVE or use heavy machinery for 24 hours (because of the sedation medicines  used during the test).    SYMPTOMS TO REPORT IMMEDIATELY: A gastroenterologist can be reached at any hour.  During normal business hours, 8:30 AM to 5:00 PM Monday through Friday, call [phone].  After hours and on weekends, please call the GI answering service at [phone] who will take a message and have the physician on call contact you.   Following lower endoscopy (colonoscopy or flexible sigmoidoscopy):  Excessive amounts of blood in the stool  Significant tenderness or worsening of abdominal pains  Swelling of the abdomen that is new, acute  Fever of 100F or higher  FOLLOW UP: If any biopsies were taken you will be contacted by phone or by letter within the next 1-3 weeks.  Call your gastroenterologist if you have not heard about the biopsies in 3 weeks.  Our staff will call the home number listed on your records the next business day following your procedure to check on you and address any questions or concerns that you may have at that time regarding the information given to you following your procedure. This is a courtesy call and so if there is no answer at the home number and we have not heard from you through the emergency physician on call, we will assume that you have returned to your regular daily activities without incident.  SIGNATURES/CONFIDENTIALITY: You and/or your care partner have signed paperwork which will be entered into your electronic medical record.  These signatures attest  to the fact that that the information above on your After Visit Summary has been reviewed and is understood.  Full responsibility of the confidentiality of this discharge information lies with you and/or your care-partner.

## 2012-07-13 ENCOUNTER — Telehealth: Payer: Self-pay

## 2012-07-13 NOTE — Telephone Encounter (Signed)
  Follow up Call-  Call back number 07/12/2012  Post procedure Call Back phone  # [phone]  Permission to leave phone message Yes     Patient questions:  Do you have a fever, pain , or abdominal swelling? no Pain Score  0 *  Have you tolerated food without any problems? yes  Have you been able to return to your normal activities? yes  Do you have any questions about your discharge instructions: Diet   no Medications  no Follow up visit  no  Do you have questions or concerns about your Care? no  Actions: * If pain score is 4 or above: No action needed, pain <4.  No problems per the pt. Maw

## 2012-08-13 ENCOUNTER — Ambulatory Visit (INDEPENDENT_AMBULATORY_CARE_PROVIDER_SITE_OTHER): Payer: Medicare PPO | Admitting: Family Medicine

## 2012-08-13 ENCOUNTER — Encounter: Payer: Self-pay | Admitting: Family Medicine

## 2012-08-13 VITALS — BP 144/90 | HR 85 | Temp 98.2°F | Wt 140.0 lb

## 2012-08-13 DIAGNOSIS — I10 Essential (primary) hypertension: Secondary | ICD-10-CM

## 2012-08-13 DIAGNOSIS — F329 Major depressive disorder, single episode, unspecified: Secondary | ICD-10-CM

## 2012-08-13 DIAGNOSIS — IMO0001 Reserved for inherently not codable concepts without codable children: Secondary | ICD-10-CM

## 2012-08-13 MED ORDER — PREDNISONE 10 MG PO TABS: 5.0000 mg | ORAL_TABLET | Freq: Every day | ORAL | Status: DC

## 2012-08-13 MED ORDER — HYDROCHLOROTHIAZIDE 25 MG PO TABS: 25.0000 mg | ORAL_TABLET | Freq: Every day | ORAL | Status: DC

## 2012-08-13 MED ORDER — AMLODIPINE BESYLATE 5 MG PO TABS: 5.0000 mg | ORAL_TABLET | Freq: Every day | ORAL | Status: DC

## 2012-08-13 MED ORDER — ALPRAZOLAM 0.5 MG PO TABS: 0.5000 mg | ORAL_TABLET | Freq: Two times a day (BID) | ORAL | Status: DC | PRN

## 2012-08-13 NOTE — Progress Notes (Signed)
  Subjective:    Patient ID: [name], female    DOB: 09/23/1945, 67 y.o.   MRN: [id number]  HPI Here for refills. She feels well but does say that her BP has been low quite often. She often gets systolics of 90-100 and diastolics of 50-60.    Review of Systems  Constitutional: Negative.   Respiratory: Negative.   Cardiovascular: Negative.        Objective:   Physical Exam  Constitutional: She appears well-developed and well-nourished.  Cardiovascular: Normal rate, regular rhythm, normal heart sounds and intact distal pulses.   Pulmonary/Chest: Effort normal and breath sounds normal.          Assessment & Plan:  Doing well. meds were refilled.

## 2012-09-29 ENCOUNTER — Other Ambulatory Visit: Payer: Self-pay | Admitting: Family Medicine

## 2012-10-05 ENCOUNTER — Ambulatory Visit (HOSPITAL_COMMUNITY)
Admission: RE | Admit: 2012-10-05 | Discharge: 2012-10-05 | Disposition: A | Discharge status: Home / Self Care | Payer: Medicare PPO | Source: Ambulatory Visit | Attending: Family Medicine | Admitting: Family Medicine

## 2012-10-05 DIAGNOSIS — Z1231 Encounter for screening mammogram for malignant neoplasm of breast: Secondary | ICD-10-CM | POA: Insufficient documentation

## 2012-11-23 ENCOUNTER — Ambulatory Visit (INDEPENDENT_AMBULATORY_CARE_PROVIDER_SITE_OTHER): Payer: Medicare PPO | Admitting: Family Medicine

## 2012-11-23 ENCOUNTER — Encounter: Payer: Self-pay | Admitting: Family Medicine

## 2012-11-23 VITALS — BP 140/86 | HR 80 | Temp 99.0°F | Wt 140.0 lb

## 2012-11-23 DIAGNOSIS — R079 Chest pain, unspecified: Secondary | ICD-10-CM

## 2012-11-23 DIAGNOSIS — I10 Essential (primary) hypertension: Secondary | ICD-10-CM

## 2012-11-23 LAB — LIPID PANEL
HDL: 59.1 mg/dL (ref >39.00)
LDL Cholesterol: 127 mg/dL — ABNORMAL HIGH (ref 0–99)
Total CHOL/HDL Ratio: 3
Triglycerides: 63 mg/dL (ref 0.0–149.0)

## 2012-11-23 LAB — HEPATIC FUNCTION PANEL
Bilirubin, Direct: 0 mg/dL (ref 0.0–0.3)
Total Bilirubin: 0.4 mg/dL (ref 0.3–1.2)

## 2012-11-23 LAB — CBC WITH DIFFERENTIAL/PLATELET
Basophils Relative: 0.4 % (ref 0.0–3.0)
Eosinophils Absolute: 0.1 10*3/uL (ref 0.0–0.7)
HCT: 36.8 % (ref 36.0–46.0)
Hemoglobin: 12.8 g/dL (ref 12.0–15.0)
Lymphs Abs: 1.4 10*3/uL (ref 0.7–4.0)
MCHC: 34.8 g/dL (ref 30.0–36.0)
MCV: 91.3 fl (ref 78.0–100.0)
Monocytes Absolute: 0.6 10*3/uL (ref 0.1–1.0)
Neutro Abs: 5.9 10*3/uL (ref 1.4–7.7)
RBC: 4.03 Mil/uL (ref 3.87–5.11)

## 2012-11-23 LAB — BASIC METABOLIC PANEL
Calcium: 8.9 mg/dL (ref 8.4–10.5)
Creatinine, Ser: 0.8 mg/dL (ref 0.4–1.2)
GFR: 92.18 mL/min (ref >60.00)
Sodium: 140 mEq/L (ref 135–145)

## 2012-11-23 LAB — POCT URINALYSIS DIPSTICK
Bilirubin, UA: NEGATIVE
Glucose, UA: NEGATIVE
Ketones, UA: NEGATIVE
Spec Grav, UA: 1.015
Urobilinogen, UA: 0.2

## 2012-11-23 MED ORDER — RANITIDINE HCL 150 MG PO TABS: 150.0000 mg | ORAL_TABLET | Freq: Two times a day (BID) | ORAL | Status: DC

## 2012-11-23 NOTE — Progress Notes (Signed)
  Subjective:    Patient ID: [name], female    DOB: 1945-11-23, 67 y.o.   MRN: [id number]  HPI Here for occasional mild chest pains which she describes as either pressure like or burning in quality. She admits to more frequent belching or heartburn lately. No SOB. This is not related to exertion. Her BP has been stable at home.    Review of Systems  Constitutional: Negative.   Respiratory: Negative.   Cardiovascular: Positive for chest pain. Negative for palpitations and leg swelling.  Gastrointestinal: Negative.        Objective:   Physical Exam  Constitutional: She appears well-developed and well-nourished.  Neck: No thyromegaly present.  Cardiovascular: Normal rate, regular rhythm, normal heart sounds and intact distal pulses.   EKG is at her baseline  Pulmonary/Chest: Effort normal and breath sounds normal.  Lymphadenopathy:    She has no cervical adenopathy.          Assessment & Plan:  She seems to be having GERD symptoms. We tried Omeprazole last fall but it caused her to have HAs. Try Zantac bid for awhile

## 2013-01-18 ENCOUNTER — Emergency Department (HOSPITAL_COMMUNITY): Payer: Medicare PPO

## 2013-01-18 ENCOUNTER — Emergency Department (HOSPITAL_COMMUNITY)
Admission: EM | Admit: 2013-01-18 | Discharge: 2013-01-18 | Disposition: A | Discharge status: Home / Self Care | Payer: Medicare PPO | Attending: Emergency Medicine | Admitting: Emergency Medicine

## 2013-01-18 ENCOUNTER — Encounter (HOSPITAL_COMMUNITY): Payer: Self-pay | Admitting: Emergency Medicine

## 2013-01-18 DIAGNOSIS — F329 Major depressive disorder, single episode, unspecified: Secondary | ICD-10-CM | POA: Insufficient documentation

## 2013-01-18 DIAGNOSIS — F3289 Other specified depressive episodes: Secondary | ICD-10-CM | POA: Insufficient documentation

## 2013-01-18 DIAGNOSIS — R109 Unspecified abdominal pain: Secondary | ICD-10-CM | POA: Insufficient documentation

## 2013-01-18 DIAGNOSIS — I10 Essential (primary) hypertension: Secondary | ICD-10-CM | POA: Insufficient documentation

## 2013-01-18 DIAGNOSIS — Z87891 Personal history of nicotine dependence: Secondary | ICD-10-CM | POA: Insufficient documentation

## 2013-01-18 DIAGNOSIS — Z79899 Other long term (current) drug therapy: Secondary | ICD-10-CM | POA: Insufficient documentation

## 2013-01-18 DIAGNOSIS — F411 Generalized anxiety disorder: Secondary | ICD-10-CM | POA: Insufficient documentation

## 2013-01-18 DIAGNOSIS — Z90710 Acquired absence of both cervix and uterus: Secondary | ICD-10-CM | POA: Insufficient documentation

## 2013-01-18 DIAGNOSIS — R110 Nausea: Secondary | ICD-10-CM | POA: Insufficient documentation

## 2013-01-18 DIAGNOSIS — Z8739 Personal history of other diseases of the musculoskeletal system and connective tissue: Secondary | ICD-10-CM | POA: Insufficient documentation

## 2013-01-18 DIAGNOSIS — E876 Hypokalemia: Secondary | ICD-10-CM | POA: Insufficient documentation

## 2013-01-18 DIAGNOSIS — E785 Hyperlipidemia, unspecified: Secondary | ICD-10-CM | POA: Insufficient documentation

## 2013-01-18 LAB — CBC WITH DIFFERENTIAL/PLATELET
HCT: 36.2 % (ref 36.0–46.0)
Hemoglobin: 12 g/dL (ref 12.0–15.0)
Lymphs Abs: 1.6 10*3/uL (ref 0.7–4.0)
Monocytes Relative: 8 % (ref 3–12)
Neutro Abs: 6.8 10*3/uL (ref 1.7–7.7)
Neutrophils Relative %: 74 % (ref 43–77)
RBC: 3.95 MIL/uL (ref 3.87–5.11)

## 2013-01-18 LAB — URINE MICROSCOPIC-ADD ON

## 2013-01-18 LAB — BASIC METABOLIC PANEL
Chloride: 99 mEq/L (ref 96–112)
GFR calc Af Amer: >90 mL/min (ref >90)
GFR calc non Af Amer: 85 mL/min — ABNORMAL LOW (ref >90)
Glucose, Bld: 108 mg/dL — ABNORMAL HIGH (ref 70–99)
Potassium: 2.8 mEq/L — ABNORMAL LOW (ref 3.5–5.1)
Sodium: 138 mEq/L (ref 135–145)

## 2013-01-18 LAB — URINALYSIS, ROUTINE W REFLEX MICROSCOPIC
Glucose, UA: NEGATIVE mg/dL
Specific Gravity, Urine: 1.008 (ref 1.005–1.030)
Urobilinogen, UA: 0.2 mg/dL (ref 0.0–1.0)
pH: 7.5 (ref 5.0–8.0)

## 2013-01-18 MED ORDER — MORPHINE SULFATE 4 MG/ML IJ SOLN: 4.0000 mg | Freq: Once | INTRAMUSCULAR | Status: DC

## 2013-01-18 MED ORDER — ONDANSETRON HCL 4 MG/2ML IJ SOLN: 4.0000 mg | Freq: Once | INTRAMUSCULAR | Status: DC

## 2013-01-18 MED ORDER — IOHEXOL 300 MG/ML  SOLN
100.0000 mL | Freq: Once | INTRAMUSCULAR | Status: AC | PRN
  Administered 2013-01-18: 100 mL via INTRAVENOUS

## 2013-01-18 MED ORDER — POTASSIUM CHLORIDE 10 MEQ/100ML IV SOLN
10.0000 meq | Freq: Once | INTRAVENOUS | Status: AC
  Administered 2013-01-18: 10 meq via INTRAVENOUS
  Filled 2013-01-18 (×2): qty 100

## 2013-01-18 MED ORDER — IOHEXOL 300 MG/ML  SOLN
50.0000 mL | Freq: Once | INTRAMUSCULAR | Status: AC | PRN
  Administered 2013-01-18: 50 mL via ORAL

## 2013-01-18 MED ORDER — SODIUM CHLORIDE 0.9 % IV BOLUS (SEPSIS)
1000.0000 mL | Freq: Once | INTRAVENOUS | Status: AC
  Administered 2013-01-18: 1000 mL via INTRAVENOUS

## 2013-01-18 MED ORDER — POTASSIUM CHLORIDE 10 MEQ/100ML IV SOLN
10.0000 meq | Freq: Once | INTRAVENOUS | Status: AC
  Administered 2013-01-18: 10 meq via INTRAVENOUS

## 2013-01-18 MED ORDER — POTASSIUM CHLORIDE CRYS ER 20 MEQ PO TBCR
40.0000 meq | EXTENDED_RELEASE_TABLET | Freq: Once | ORAL | Status: AC
  Administered 2013-01-18: 40 meq via ORAL
  Filled 2013-01-18: qty 2

## 2013-01-18 NOTE — Discharge Instructions (Signed)
We saw you in the ER for the abdominal pain. All of our results are normal, including all labs and imaging. Kidney function is fine as well. We are not sure what is causing your abdominal pain, and recommend that you see your primary care doctor within 2-3 days for further evaluation. If your symptoms get worse, return to the ER. Take the pain meds and nausea meds as prescribed.    Abdominal Pain Abdominal pain can be caused by many things. Your caregiver decides the seriousness of your pain by an examination and possibly blood tests and X-rays. Many cases can be observed and treated at home. Most abdominal pain is not caused by a disease and will probably improve without treatment. However, in many cases, more time must pass before a clear cause of the pain can be found. Before that point, it may not be known if you need more testing, or if hospitalization or surgery is needed. HOME CARE INSTRUCTIONS   Do not take laxatives unless directed by your caregiver.  Take pain medicine only as directed by your caregiver.  Only take over-the-counter or prescription medicines for pain, discomfort, or fever as directed by your caregiver.  Try a clear liquid diet (broth, tea, or water) for as long as directed by your caregiver. Slowly move to a bland diet as tolerated. SEEK IMMEDIATE MEDICAL CARE IF:   The pain does not go away.  You have a fever.  You keep throwing up (vomiting).  The pain is felt only in portions of the abdomen. Pain in the right side could possibly be appendicitis. In an adult, pain in the left lower portion of the abdomen could be colitis or diverticulitis.  You pass bloody or black tarry stools. MAKE SURE YOU:   Understand these instructions.  Will watch your condition.  Will get help right away if you are not doing well or get worse. Document Released: 04/23/2005 Document Revised: 10/06/2011 Document Reviewed: 03/01/2008 [facility] Patient Information 2014 [place],  [place].  Hypokalemia Hypokalemia means a low potassium level in the blood. Symptoms may include muscle weakness and cramping, fatigue, abdominal pain, vomiting, constipation, or irregularities of the heartbeat. Sometimes hypokalemia is discovered by your caregiver if you are taking certain medicines for high blood pressure or kidney disease.  Potassium is an electrolyte that helps regulate the amount of fluid in the body. It also stimulates muscle contraction and maintains a stable acid-base balance. If potassium levels go too low or too high, your health may be in danger. You are at risk for developing shock, heart, and lung problems. Hypokalemia can occur if you have excessive diarrhea, vomiting, or sweating. Potassium can be lost through your kidneys in the urine. Certain common medicines can also cause potassium loss, especially water pills (diuretics). The same is possible with cortisone medications or certain types of antibiotics. Low potassium can be dangerous if you are taking certain heart medicines. In diabetes, your potassium may fall after you take insulin, especially if your diabetes had been out of control for a while. In rare cases, potassium may be low because you are not getting enough in your diet.  In adults, a potassium level below 3.5 mEq/L is usually considered low. Hypokalemia can be treated with potassium supplements taken by mouth and a diet that is high in potassium. Foods with high potassium content are:  Peas, lentils, lima beans, nuts, and dried fruit.  Whole grain and bran cereals and breads.  Fresh fruit and vegetables. Examples include:  Bananas.  Cantaloupe.  Grapefruit.  Oranges.  Tomatoes.  Honeydew melons.  Potatoes.  Peaches.  Orange and tomato juices.  Meats. See your caregiver as instructed for a follow-up blood test to be sure your potassium is back to normal. SEEK MEDICAL CARE IF:   You have nausea, vomiting, constipation, or abdominal  pain.  You have palpitations or irregular heartbeats, chest pain or shortness of breath.  You have muscle cramps or weakness or fatigue.  You have lethargy. SEEK IMMEDIATE MEDICAL CARE IF:   You have paralysis.  You have confusion or other mental status changes. Document Released: 07/14/2005 Document Revised: 10/06/2011 Document Reviewed: 11/07/2009 [facility] Patient Information 2014 [place], [place].

## 2013-01-18 NOTE — ED Notes (Signed)
Patient with diffuse lower abdominal pain which started yesterday.  Patient thinks she has a UTI.  Denies nausea, vomiting, dyuria.  Patient reports she is voiding normal amounts.  Abdomen is distended but patient reports that is normal after she eats or drink anything.  Patient rates pain as an 8/10.

## 2013-01-18 NOTE — ED Provider Notes (Signed)
History    CSN: [id number] Arrival date & time 01/18/13  1736  First MD Initiated Contact with Patient 01/18/13 1806     Chief Complaint  Patient presents with  . Abdominal Pain   (Consider location/radiation/quality/duration/timing/severity/associated sxs/prior Treatment) HPI Comments: Pt with hx of chronic abd pain, fibromyalgia comes in with cc of abd pain. Pt states that her pain has gotten worse over the past 2-3 days. The pain is located diffusely. No n/v/f/c. No uti like sx. No hx of renal stones. Pt does state however, that when she gets uti her sx are similar, and that she never has dysuria, hematuria.  Patient is a 67 y.o. female presenting with abdominal pain. The history is provided by the patient.  Abdominal Pain Associated symptoms include abdominal pain. Pertinent negatives include no chest pain and no shortness of breath.   Past Medical History  Diagnosis Date  . Anxiety   . Depression   . Fibromyalgia     ESR, CRP, ANA and RF all wnl.  . Hypertension   . Hyperlipidemia    Past Surgical History  Procedure Laterality Date  . Abdominal hysterectomy  age 63   Family History  Problem Relation Age of Onset  . Alcohol abuse Father   . Kidney disease Father     ESRD  . Cardiomyopathy Father     alcohol induced CM  . Cancer Brother     head and neck (throat)  . Hypertension Other   . Colon cancer Neg Hx    History  Substance Use Topics  . Smoking status: Former Smoker    Types: Cigarettes    Quit date: 07/29/1963  . Smokeless tobacco: Never Used  . Alcohol Use: No   OB History   Grav Para Term Preterm Abortions TAB SAB Ect Mult Living                 Review of Systems  Constitutional: Negative for activity change.  HENT: Negative for facial swelling and neck pain.   Respiratory: Negative for cough, shortness of breath and wheezing.   Cardiovascular: Negative for chest pain.  Gastrointestinal: Positive for nausea and abdominal pain. Negative for  vomiting, diarrhea, constipation, blood in stool and abdominal distention.  Genitourinary: Negative for hematuria and difficulty urinating.  Skin: Negative for color change.  Neurological: Negative for speech difficulty.  Hematological: Does not bruise/bleed easily.  Psychiatric/Behavioral: Negative for confusion.    Allergies  Caffeine; Ciprofloxacin; Codeine; Escitalopram oxalate; Hydrocodone; and Oxybutynin chloride  Home Medications   Current Outpatient Rx  Name  Route  Sig  Dispense  Refill  . ALPRAZolam (XANAX) 0.5 MG tablet   Oral   Take 0.5 mg by mouth at bedtime.         Marland Kitchen amLODipine (NORVASC) 5 MG tablet   Oral   Take 1 tablet (5 mg total) by mouth daily.   30 tablet   11   . fish oil-omega-3 fatty acids 1000 MG capsule   Oral   Take 2 g by mouth daily.         . hydrochlorothiazide (HYDRODIURIL) 25 MG tablet   Oral   Take 1 tablet (25 mg total) by mouth daily.   30 tablet   11   . Magnesium 400 MG CAPS   Oral   Take by mouth daily.         . predniSONE (DELTASONE) 10 MG tablet   Oral   Take 0.5 tablets (5 mg total) by mouth  daily.   30 tablet   11   . ranitidine (ZANTAC) 150 MG tablet   Oral   Take 150 mg by mouth 2 (two) times daily as needed for heartburn.          BP 179/83  Pulse 88  Temp(Src) 98.6 F (37 C) (Oral)  Resp 20  Wt 140 lb (63.504 kg)  BMI 20.67 kg/m2  SpO2 100%  LMP 03/31/2004 Physical Exam  Nursing note and vitals reviewed. Constitutional: She is oriented to person, place, and time. She appears well-developed and well-nourished.  HENT:  Head: Normocephalic and atraumatic.  Eyes: EOM are normal. Pupils are equal, round, and reactive to light.  Neck: Neck supple.  Cardiovascular: Normal rate, regular rhythm and normal heart sounds.   No murmur heard. Pulmonary/Chest: Effort normal. No respiratory distress.  Abdominal: Soft. She exhibits no distension. There is tenderness. There is no rebound and no guarding.   Diffuse abd tenderness, no cva tenderness.  Neurological: She is alert and oriented to person, place, and time.  Skin: Skin is warm and dry.    ED Course  Procedures (including critical care time) Labs Reviewed  BASIC METABOLIC PANEL - Abnormal; Notable for the following:    Potassium 2.8 (*)    Glucose, Bld 108 (*)    GFR calc non Af Amer 85 (*)    All other components within normal limits  CBC WITH DIFFERENTIAL  URINALYSIS, ROUTINE W REFLEX MICROSCOPIC   No results found. No diagnosis found.  MDM  DDx includes: Pancreatitis Hepatobiliary pathology including cholecystitis Gastritis/PUD SBO ACS syndrome Aortic Dissection Colitis AAA Tumors Colitis Intra abdominal abscess Thrombosis Mesenteric ischemia Diverticulitis Peritonitis Appendicitis Hernia Nephrolithiasis Pyelonephritis UTI/Cystitis Ovarian cyst TOA  Pt comes in with cc of abd pain. Diffuse abd tenderness on my exam - and she had guarding during the exam. I cant explain diffuse abd tenderness with a UTI - so we will be getting CT scan to delineate the pain.           Derwood Kaplan, MD 01/18/13 2001

## 2013-01-21 ENCOUNTER — Encounter: Payer: Self-pay | Admitting: Family Medicine

## 2013-01-21 ENCOUNTER — Ambulatory Visit (INDEPENDENT_AMBULATORY_CARE_PROVIDER_SITE_OTHER): Payer: Medicare PPO | Admitting: Family Medicine

## 2013-01-21 VITALS — BP 168/82 | HR 84 | Temp 98.4°F | Wt 136.0 lb

## 2013-01-21 DIAGNOSIS — N390 Urinary tract infection, site not specified: Secondary | ICD-10-CM

## 2013-01-21 MED ORDER — SULFAMETHOXAZOLE-TRIMETHOPRIM 800-160 MG PO TABS: 1.0000 | ORAL_TABLET | Freq: Two times a day (BID) | ORAL | Status: DC

## 2013-01-21 NOTE — Progress Notes (Signed)
  Subjective:    Patient ID: [name], female    DOB: [birth year], 67 y.o.   MRN: [id number]  HPI Here to follow up on an ER visit on 01-18-13. She presented with diffuse abdominal pains and nausea without vomiting. No fever. She had had some urinary frequency for a week prior to that. Her labs were normal except for a low potassium and she had a UTI. She was given IV fluids. She had an abdominal and pelvic CT scan which was normal. For some reason she was sent home without any treatment for her UTI. Since then she has continued to have lower abdominal pains and nausea. Drinking fluids but not eating much. BMs are normal.    Review of Systems  Constitutional: Negative.   Respiratory: Negative.   Cardiovascular: Negative.   Gastrointestinal: Positive for nausea and abdominal pain. Negative for vomiting, diarrhea, constipation, blood in stool and abdominal distention.  Genitourinary: Positive for urgency and frequency. Negative for dysuria, hematuria and flank pain.       Objective:   Physical Exam  Constitutional: She appears well-developed and well-nourished. No distress.  Cardiovascular: Normal rate, regular rhythm, normal heart sounds and intact distal pulses.   Pulmonary/Chest: Effort normal and breath sounds normal.  Abdominal: Soft. Bowel sounds are normal. She exhibits no distension and no mass. There is no rebound and no guarding.  Moderately tender in both lower quadrants           Assessment & Plan:  UTI. Treat with Bactrim DS bid for 10 days. Drink plenty of water. We attempted to get a urine sample for a culture today but she could not produce one. She will let me know early next week how she is feeling.

## 2013-02-19 ENCOUNTER — Other Ambulatory Visit: Payer: Self-pay | Admitting: Family Medicine

## 2013-02-21 NOTE — Telephone Encounter (Signed)
Call in #[phone] with 5 rf 

## 2013-04-19 ENCOUNTER — Ambulatory Visit (INDEPENDENT_AMBULATORY_CARE_PROVIDER_SITE_OTHER): Payer: Medicare PPO | Admitting: Family Medicine

## 2013-04-19 ENCOUNTER — Encounter: Payer: Self-pay | Admitting: Family Medicine

## 2013-04-19 VITALS — BP 160/80 | HR 89 | Temp 98.3°F | Wt 134.0 lb

## 2013-04-19 DIAGNOSIS — E785 Hyperlipidemia, unspecified: Secondary | ICD-10-CM

## 2013-04-19 DIAGNOSIS — I10 Essential (primary) hypertension: Secondary | ICD-10-CM

## 2013-04-19 DIAGNOSIS — IMO0001 Reserved for inherently not codable concepts without codable children: Secondary | ICD-10-CM

## 2013-04-19 LAB — LIPID PANEL
Cholesterol: 210 mg/dL — ABNORMAL HIGH (ref 0–200)
HDL: 61.9 mg/dL (ref >39.00)
Triglycerides: 64 mg/dL (ref 0.0–149.0)

## 2013-04-19 LAB — CBC WITH DIFFERENTIAL/PLATELET
Basophils Absolute: 0 10*3/uL (ref 0.0–0.1)
Eosinophils Absolute: 0 10*3/uL (ref 0.0–0.7)
Lymphocytes Relative: 16.2 % (ref 12.0–46.0)
MCHC: 33.5 g/dL (ref 30.0–36.0)
Neutrophils Relative %: 76 % (ref 43.0–77.0)
RBC: 4.16 Mil/uL (ref 3.87–5.11)
RDW: 13 % (ref 11.5–14.6)

## 2013-04-19 LAB — LDL CHOLESTEROL, DIRECT: Direct LDL: 135.8 mg/dL

## 2013-04-19 LAB — POCT URINALYSIS DIPSTICK
Bilirubin, UA: NEGATIVE
Nitrite, UA: NEGATIVE
Protein, UA: NEGATIVE
pH, UA: 6.5

## 2013-04-19 LAB — BASIC METABOLIC PANEL
BUN: 11 mg/dL (ref 6–23)
Creatinine, Ser: 0.7 mg/dL (ref 0.4–1.2)
GFR: 100.73 mL/min (ref >60.00)
Glucose, Bld: 93 mg/dL (ref 70–99)
Potassium: 3.2 mEq/L — ABNORMAL LOW (ref 3.5–5.1)

## 2013-04-19 LAB — HEPATIC FUNCTION PANEL
AST: 27 U/L (ref 0–37)
Albumin: 4 g/dL (ref 3.5–5.2)

## 2013-04-19 NOTE — Progress Notes (Signed)
  Subjective:    Patient ID: [name], female    DOB: 1946-01-03, 67 y.o.   MRN: [id number]  HPI Here to follow up. Her BP at home is stable. Her fibromyalgia pain is fairly well controlled but she does complain if some generalized fatigue. Her weight is stable.    Review of Systems  Constitutional: Positive for fatigue. Negative for activity change, appetite change and unexpected weight change.  Respiratory: Negative.   Cardiovascular: Negative.   Musculoskeletal: Positive for myalgias.       Objective:   Physical Exam  Constitutional: She appears well-developed and well-nourished.  Cardiovascular: Normal rate, regular rhythm, normal heart sounds and intact distal pulses.   Pulmonary/Chest: Effort normal and breath sounds normal.  Musculoskeletal: She exhibits no edema.          Assessment & Plan:  Get fasting labs today.

## 2013-04-22 MED ORDER — POTASSIUM CHLORIDE CRYS ER 10 MEQ PO TBCR: 10.0000 meq | EXTENDED_RELEASE_TABLET | Freq: Two times a day (BID) | ORAL | Status: DC

## 2013-04-22 NOTE — Addendum Note (Signed)
Addended by: Aniceto Boss A on: 04/22/2013 03:55 PM   Modules accepted: Orders

## 2013-04-22 NOTE — Progress Notes (Signed)
Quick Note:  I spoke with pt and sent script e-scribe. ______ 

## 2013-05-31 ENCOUNTER — Encounter: Payer: Self-pay | Admitting: Family Medicine

## 2013-05-31 ENCOUNTER — Ambulatory Visit (INDEPENDENT_AMBULATORY_CARE_PROVIDER_SITE_OTHER): Payer: Medicare HMO | Admitting: Family Medicine

## 2013-05-31 VITALS — BP 142/88 | HR 82 | Temp 98.3°F | Wt 136.0 lb

## 2013-05-31 DIAGNOSIS — E876 Hypokalemia: Secondary | ICD-10-CM

## 2013-05-31 DIAGNOSIS — I10 Essential (primary) hypertension: Secondary | ICD-10-CM

## 2013-05-31 LAB — BASIC METABOLIC PANEL
GFR: 99.15 mL/min (ref >60.00)
Potassium: 3.7 mEq/L (ref 3.5–5.1)
Sodium: 139 mEq/L (ref 135–145)

## 2013-05-31 NOTE — Progress Notes (Signed)
  Subjective:    Patient ID: [name], female    DOB: 04-13-1946, 67 y.o.   MRN: [id number]  HPI Here to recheck HTN and her potassium level. This was 3.2 six weeks ago. We started her on klor-con but she stopped this after one week because it caused her bladder to burn. After she stopped it the bladder returned to normal. Since then she has focused on getting more potassium in her foods like greens and bananas.    Review of Systems  Constitutional: Negative.   Respiratory: Negative.   Cardiovascular: Negative.        Objective:   Physical Exam  Constitutional: She appears well-developed and well-nourished.  Cardiovascular: Normal rate, regular rhythm, normal heart sounds and intact distal pulses.   Pulmonary/Chest: Effort normal and breath sounds normal.          Assessment & Plan:  Her HTN is stable. Get a BMET today.

## 2013-06-03 NOTE — Progress Notes (Signed)
Quick Note:  I spoke with pt ______ 

## 2013-07-22 ENCOUNTER — Telehealth: Payer: Self-pay | Admitting: Family Medicine

## 2013-07-22 NOTE — Telephone Encounter (Signed)
Pt would like to know can a new rx be written for 60 tablets, 1 mo supply? Please advise.

## 2013-07-25 NOTE — Telephone Encounter (Signed)
I left voice message for pt. I do not know what medication it is. Pt can also get pharmacy to fax over this request, it might be quicker that way.

## 2013-08-09 ENCOUNTER — Ambulatory Visit (INDEPENDENT_AMBULATORY_CARE_PROVIDER_SITE_OTHER): Payer: Commercial Managed Care - HMO | Admitting: Family Medicine

## 2013-08-09 ENCOUNTER — Encounter: Payer: Self-pay | Admitting: Family Medicine

## 2013-08-09 VITALS — BP 160/76 | HR 93 | Temp 98.8°F | Wt 136.0 lb

## 2013-08-09 DIAGNOSIS — G4700 Insomnia, unspecified: Secondary | ICD-10-CM

## 2013-08-09 DIAGNOSIS — M545 Low back pain, unspecified: Secondary | ICD-10-CM

## 2013-08-09 DIAGNOSIS — IMO0001 Reserved for inherently not codable concepts without codable children: Secondary | ICD-10-CM

## 2013-08-09 DIAGNOSIS — I10 Essential (primary) hypertension: Secondary | ICD-10-CM

## 2013-08-09 MED ORDER — PREDNISONE 10 MG PO TABS: 10.0000 mg | ORAL_TABLET | Freq: Every day | ORAL | Status: DC

## 2013-08-09 MED ORDER — ALPRAZOLAM 0.5 MG PO TABS: ORAL_TABLET | ORAL | Status: DC

## 2013-08-09 MED ORDER — HYDROCHLOROTHIAZIDE 25 MG PO TABS: 25.0000 mg | ORAL_TABLET | Freq: Every day | ORAL | Status: DC

## 2013-08-09 MED ORDER — AMLODIPINE BESYLATE 5 MG PO TABS: 5.0000 mg | ORAL_TABLET | Freq: Every day | ORAL | Status: DC

## 2013-08-09 NOTE — Progress Notes (Signed)
   Subjective:    Patient ID: [name], female    DOB: Jul 24, 1946, 68 y.o.   MRN: [id number]  HPI Here to follow up. She has been doing well in general although her fibromyalgia gets worse in cold or damp weather.    Review of Systems  Constitutional: Negative.   Respiratory: Negative.   Cardiovascular: Negative.        Objective:   Physical Exam  Constitutional: She appears well-developed and well-nourished.  Cardiovascular: Normal rate, regular rhythm, normal heart sounds and intact distal pulses.   Pulmonary/Chest: Effort normal and breath sounds normal.          Assessment & Plan:  Doing well. Refills were given

## 2013-08-30 ENCOUNTER — Telehealth: Payer: Self-pay | Admitting: Family Medicine

## 2013-08-31 NOTE — Telephone Encounter (Signed)
Relevant patient education mailed to patient.  

## 2013-09-19 ENCOUNTER — Other Ambulatory Visit: Payer: Self-pay | Admitting: Family Medicine

## 2013-09-19 DIAGNOSIS — Z1231 Encounter for screening mammogram for malignant neoplasm of breast: Secondary | ICD-10-CM

## 2013-10-07 ENCOUNTER — Ambulatory Visit (HOSPITAL_COMMUNITY)
Admission: RE | Admit: 2013-10-07 | Discharge: 2013-10-07 | Disposition: A | Discharge status: Home / Self Care | Payer: Medicare HMO | Source: Ambulatory Visit | Attending: Family Medicine | Admitting: Family Medicine

## 2013-10-07 DIAGNOSIS — Z1231 Encounter for screening mammogram for malignant neoplasm of breast: Secondary | ICD-10-CM | POA: Insufficient documentation

## 2013-12-02 ENCOUNTER — Ambulatory Visit (INDEPENDENT_AMBULATORY_CARE_PROVIDER_SITE_OTHER): Payer: Commercial Managed Care - HMO | Admitting: Family Medicine

## 2013-12-02 ENCOUNTER — Encounter: Payer: Self-pay | Admitting: Family Medicine

## 2013-12-02 VITALS — BP 171/100 | HR 89 | Temp 99.0°F | Ht 69.0 in | Wt 133.0 lb

## 2013-12-02 DIAGNOSIS — E785 Hyperlipidemia, unspecified: Secondary | ICD-10-CM

## 2013-12-02 DIAGNOSIS — IMO0001 Reserved for inherently not codable concepts without codable children: Secondary | ICD-10-CM

## 2013-12-02 DIAGNOSIS — I10 Essential (primary) hypertension: Secondary | ICD-10-CM

## 2013-12-02 DIAGNOSIS — K5900 Constipation, unspecified: Secondary | ICD-10-CM | POA: Insufficient documentation

## 2013-12-02 LAB — CBC WITH DIFFERENTIAL/PLATELET
BASOS PCT: 0.7 % (ref 0.0–3.0)
Basophils Absolute: 0 10*3/uL (ref 0.0–0.1)
EOS PCT: 1.3 % (ref 0.0–5.0)
Eosinophils Absolute: 0.1 10*3/uL (ref 0.0–0.7)
HCT: 38.8 % (ref 36.0–46.0)
HEMOGLOBIN: 13.1 g/dL (ref 12.0–15.0)
LYMPHS PCT: 17.8 % (ref 12.0–46.0)
Lymphs Abs: 1.2 10*3/uL (ref 0.7–4.0)
MCHC: 33.7 g/dL (ref 30.0–36.0)
MCV: 92.6 fl (ref 78.0–100.0)
MONO ABS: 0.5 10*3/uL (ref 0.1–1.0)
Monocytes Relative: 6.7 % (ref 3.0–12.0)
NEUTROS ABS: 5.1 10*3/uL (ref 1.4–7.7)
NEUTROS PCT: 73.5 % (ref 43.0–77.0)
Platelets: 196 10*3/uL (ref 150.0–400.0)
RBC: 4.19 Mil/uL (ref 3.87–5.11)
RDW: 13.1 % (ref 11.5–15.5)
WBC: 6.9 10*3/uL (ref 4.0–10.5)

## 2013-12-02 LAB — BASIC METABOLIC PANEL
BUN: 10 mg/dL (ref 6–23)
CHLORIDE: 104 meq/L (ref 96–112)
CO2: 28 meq/L (ref 19–32)
CREATININE: 0.8 mg/dL (ref 0.4–1.2)
Calcium: 9.1 mg/dL (ref 8.4–10.5)
GFR: 93.24 mL/min (ref >60.00)
Glucose, Bld: 93 mg/dL (ref 70–99)
Potassium: 3.5 mEq/L (ref 3.5–5.1)
SODIUM: 140 meq/L (ref 135–145)

## 2013-12-02 LAB — HEPATIC FUNCTION PANEL
ALBUMIN: 3.9 g/dL (ref 3.5–5.2)
ALK PHOS: 117 U/L (ref 39–117)
ALT: 21 U/L (ref 0–35)
AST: 19 U/L (ref 0–37)
BILIRUBIN DIRECT: 0.1 mg/dL (ref 0.0–0.3)
TOTAL PROTEIN: 6.9 g/dL (ref 6.0–8.3)
Total Bilirubin: 0.7 mg/dL (ref 0.2–1.2)

## 2013-12-02 LAB — LIPID PANEL
CHOL/HDL RATIO: 4
Cholesterol: 225 mg/dL — ABNORMAL HIGH (ref 0–200)
HDL: 55 mg/dL (ref >39.00)
LDL CALC: 149 mg/dL — AB (ref 0–99)
TRIGLYCERIDES: 106 mg/dL (ref 0.0–149.0)
VLDL: 21.2 mg/dL (ref 0.0–40.0)

## 2013-12-02 LAB — TSH: TSH: 1.05 u[IU]/mL (ref 0.35–4.50)

## 2013-12-02 MED ORDER — TRAMADOL HCL 50 MG PO TABS: 50.0000 mg | ORAL_TABLET | Freq: Four times a day (QID) | ORAL | Status: DC | PRN

## 2013-12-02 MED ORDER — LUBIPROSTONE 24 MCG PO CAPS: 24.0000 ug | ORAL_CAPSULE | Freq: Two times a day (BID) | ORAL | Status: DC

## 2013-12-02 NOTE — Progress Notes (Signed)
   Subjective:    Patient ID: [name], female    DOB: [birth year], 68 y.o.   MRN: [id number]  HPI Here to follow up. Her fibromyalgia has been worse the past month and she hurts all over every day. Also she is more constipated than ever before. She has tried Miralax, Metamucil, Milk of magnesia, and Colace with no relief. Her last BM was a week ago. Her BP is stable at home but it is up today because she is in pain.    Review of Systems  Constitutional: Negative.   Respiratory: Negative.   Cardiovascular: Negative.   Gastrointestinal: Positive for constipation. Negative for nausea, vomiting, abdominal pain, diarrhea, blood in stool and abdominal distention.  Musculoskeletal: Positive for arthralgias and myalgias.       Objective:   Physical Exam  Constitutional: She appears well-developed and well-nourished.  Cardiovascular: Normal rate, regular rhythm, normal heart sounds and intact distal pulses.   Pulmonary/Chest: Effort normal and breath sounds normal.  Abdominal: Soft. Bowel sounds are normal. She exhibits no distension and no mass. There is no rebound and no guarding.  Mildly tender in all quadrants           Assessment & Plan:  Get on Amitiza 24 mcg bid. We will taper off Prednisone (take 5 mg daily for a week and then stop it). Try Tramadol 50 mg for pain.

## 2013-12-02 NOTE — Progress Notes (Signed)
Pre visit review using our clinic review tool, if applicable. No additional management support is needed unless otherwise documented below in the visit note. 

## 2013-12-16 ENCOUNTER — Encounter: Payer: Self-pay | Admitting: Family Medicine

## 2013-12-16 ENCOUNTER — Ambulatory Visit (INDEPENDENT_AMBULATORY_CARE_PROVIDER_SITE_OTHER): Payer: Commercial Managed Care - HMO | Admitting: Family Medicine

## 2013-12-16 VITALS — BP 174/91 | HR 84 | Temp 99.3°F | Ht 69.0 in | Wt 133.0 lb

## 2013-12-16 DIAGNOSIS — M2550 Pain in unspecified joint: Secondary | ICD-10-CM

## 2013-12-16 DIAGNOSIS — I10 Essential (primary) hypertension: Secondary | ICD-10-CM

## 2013-12-16 MED ORDER — PREDNISONE 10 MG PO TABS: 10.0000 mg | ORAL_TABLET | Freq: Two times a day (BID) | ORAL | Status: DC

## 2013-12-16 NOTE — Progress Notes (Signed)
   Subjective:    Patient ID: [name], female    DOB: [birth year], 68 y.o.   MRN: [id number]  HPI Here for worsening joint pains. She had weaned off prednisone and the pains have returned. For the past few days has taken 5 mg a day and this helps slightly. We had intended for her to see Rheumatology 3 years ago but this did not work out for some reason.   Review of Systems  Constitutional: Negative.   Musculoskeletal: Positive for arthralgias.       Objective:   Physical Exam  Constitutional: She appears well-developed and well-nourished.  Limping, in pain   Cardiovascular: Normal rate, regular rhythm, normal heart sounds and intact distal pulses.   Pulmonary/Chest: Effort normal and breath sounds normal.          Assessment & Plan:  We will start her back on 20 mg a day of prednisone and will refer to Rheumatology again. She gets excellent relief with prednisone but an alternative form of therapy would be better for her over time.

## 2013-12-16 NOTE — Progress Notes (Signed)
Pre visit review using our clinic review tool, if applicable. No additional management support is needed unless otherwise documented below in the visit note. 

## 2013-12-26 ENCOUNTER — Telehealth: Payer: Self-pay | Admitting: Family Medicine

## 2013-12-26 NOTE — Telephone Encounter (Signed)
Pt has an appt with dr Trudie Reed on 02-02-14 at 9am dx arthralgia. Pt has [employer] and needs referral

## 2014-03-22 ENCOUNTER — Other Ambulatory Visit: Payer: Self-pay | Admitting: Family Medicine

## 2014-03-23 NOTE — Telephone Encounter (Signed)
Refill for 6 months. 

## 2014-03-25 ENCOUNTER — Emergency Department (HOSPITAL_COMMUNITY): Payer: Medicare HMO

## 2014-03-25 ENCOUNTER — Emergency Department (HOSPITAL_COMMUNITY)
Admission: EM | Admit: 2014-03-25 | Discharge: 2014-03-25 | Disposition: A | Discharge status: Home / Self Care | Payer: Medicare HMO | Attending: Emergency Medicine | Admitting: Emergency Medicine

## 2014-03-25 ENCOUNTER — Encounter (HOSPITAL_COMMUNITY): Payer: Self-pay | Admitting: Emergency Medicine

## 2014-03-25 DIAGNOSIS — Z8739 Personal history of other diseases of the musculoskeletal system and connective tissue: Secondary | ICD-10-CM | POA: Diagnosis not present

## 2014-03-25 DIAGNOSIS — Z79899 Other long term (current) drug therapy: Secondary | ICD-10-CM | POA: Diagnosis not present

## 2014-03-25 DIAGNOSIS — R0609 Other forms of dyspnea: Secondary | ICD-10-CM | POA: Insufficient documentation

## 2014-03-25 DIAGNOSIS — F329 Major depressive disorder, single episode, unspecified: Secondary | ICD-10-CM | POA: Diagnosis not present

## 2014-03-25 DIAGNOSIS — I10 Essential (primary) hypertension: Secondary | ICD-10-CM | POA: Diagnosis not present

## 2014-03-25 DIAGNOSIS — R61 Generalized hyperhidrosis: Secondary | ICD-10-CM | POA: Insufficient documentation

## 2014-03-25 DIAGNOSIS — IMO0002 Reserved for concepts with insufficient information to code with codable children: Secondary | ICD-10-CM | POA: Insufficient documentation

## 2014-03-25 DIAGNOSIS — R0989 Other specified symptoms and signs involving the circulatory and respiratory systems: Secondary | ICD-10-CM | POA: Diagnosis not present

## 2014-03-25 DIAGNOSIS — F3289 Other specified depressive episodes: Secondary | ICD-10-CM | POA: Insufficient documentation

## 2014-03-25 DIAGNOSIS — Z87891 Personal history of nicotine dependence: Secondary | ICD-10-CM | POA: Diagnosis not present

## 2014-03-25 DIAGNOSIS — F411 Generalized anxiety disorder: Secondary | ICD-10-CM | POA: Insufficient documentation

## 2014-03-25 DIAGNOSIS — R079 Chest pain, unspecified: Secondary | ICD-10-CM | POA: Diagnosis present

## 2014-03-25 DIAGNOSIS — R0789 Other chest pain: Secondary | ICD-10-CM | POA: Insufficient documentation

## 2014-03-25 DIAGNOSIS — E785 Hyperlipidemia, unspecified: Secondary | ICD-10-CM | POA: Diagnosis not present

## 2014-03-25 LAB — BASIC METABOLIC PANEL
ANION GAP: 15 (ref 5–15)
BUN: 10 mg/dL (ref 6–23)
CALCIUM: 9.3 mg/dL (ref 8.4–10.5)
CO2: 25 mEq/L (ref 19–32)
Chloride: 99 mEq/L (ref 96–112)
Creatinine, Ser: 0.79 mg/dL (ref 0.50–1.10)
GFR calc Af Amer: >90 mL/min (ref >90)
GFR calc non Af Amer: 84 mL/min — ABNORMAL LOW (ref >90)
Glucose, Bld: 230 mg/dL — ABNORMAL HIGH (ref 70–99)
Potassium: 4 mEq/L (ref 3.7–5.3)
Sodium: 139 mEq/L (ref 137–147)

## 2014-03-25 LAB — CBC WITH DIFFERENTIAL/PLATELET
BASOS ABS: 0 10*3/uL (ref 0.0–0.1)
Basophils Relative: 0 % (ref 0–1)
Eosinophils Absolute: 0 10*3/uL (ref 0.0–0.7)
Eosinophils Relative: 0 % (ref 0–5)
HEMATOCRIT: 38 % (ref 36.0–46.0)
Hemoglobin: 12.8 g/dL (ref 12.0–15.0)
Lymphocytes Relative: 9 % — ABNORMAL LOW (ref 12–46)
Lymphs Abs: 0.7 10*3/uL (ref 0.7–4.0)
MCH: 30.8 pg (ref 26.0–34.0)
MCHC: 33.7 g/dL (ref 30.0–36.0)
MCV: 91.3 fL (ref 78.0–100.0)
Monocytes Absolute: 0.2 10*3/uL (ref 0.1–1.0)
Monocytes Relative: 2 % — ABNORMAL LOW (ref 3–12)
Neutro Abs: 7 10*3/uL (ref 1.7–7.7)
Neutrophils Relative %: 89 % — ABNORMAL HIGH (ref 43–77)
Platelets: 211 10*3/uL (ref 150–400)
RBC: 4.16 MIL/uL (ref 3.87–5.11)
RDW: 12.5 % (ref 11.5–15.5)
WBC: 7.9 10*3/uL (ref 4.0–10.5)

## 2014-03-25 LAB — I-STAT CHEM 8, ED
BUN: 8 mg/dL (ref 6–23)
CHLORIDE: 101 meq/L (ref 96–112)
Calcium, Ion: 1.11 mmol/L — ABNORMAL LOW (ref 1.13–1.30)
Creatinine, Ser: 0.8 mg/dL (ref 0.50–1.10)
GLUCOSE: 228 mg/dL — AB (ref 70–99)
HCT: 40 % (ref 36.0–46.0)
Hemoglobin: 13.6 g/dL (ref 12.0–15.0)
Potassium: 3.6 mEq/L — ABNORMAL LOW (ref 3.7–5.3)
Sodium: 138 mEq/L (ref 137–147)
TCO2: 25 mmol/L (ref 0–100)

## 2014-03-25 LAB — TROPONIN I: Troponin I: <0.3 ng/mL (ref <0.30)

## 2014-03-25 MED ORDER — SODIUM CHLORIDE 0.9 % IV SOLN
INTRAVENOUS | Status: DC
  Administered 2014-03-25: 125 mL/h via INTRAVENOUS

## 2014-03-25 MED ORDER — ASPIRIN 81 MG PO CHEW
324.0000 mg | CHEWABLE_TABLET | Freq: Once | ORAL | Status: AC
  Administered 2014-03-25: 324 mg via ORAL
  Filled 2014-03-25: qty 4

## 2014-03-25 NOTE — ED Notes (Signed)
Patient transported to X-ray 

## 2014-03-25 NOTE — ED Notes (Signed)
MD at bedside. 

## 2014-03-25 NOTE — ED Notes (Signed)
Per Ronny Bacon in lab, Troponin ordered at 2207hrs will be added to blood collected at 2145hrs

## 2014-03-25 NOTE — ED Notes (Signed)
Pt arrived to the ED with a complaint of chest pain.  Pt states the pain is recurrent.  Pt states the pain is in the central chest with radiation to the left flank and neck.  Pt states's he has been seen for it previously.  Pt states she has a history of fiber myalgia.  Pt states pain began today about an hour and a half ago.

## 2014-03-25 NOTE — Discharge Instructions (Signed)

## 2014-03-25 NOTE — ED Provider Notes (Addendum)
CSN: [id number]     Arrival date & time 03/25/14  2053 History   First MD Initiated Contact with Patient 03/25/14 2116     Chief Complaint  Patient presents with  . Chest Pain     (Consider location/radiation/quality/duration/timing/severity/associated sxs/prior Treatment) HPI Comments: Patient here complaining of recurrent substernal chest pressure which radiates to her left flank and neck. Symptoms are exertional and better with rest and associated with diaphoresis and dyspnea. No pleuritic component to this. No leg swelling noted. According to the patient she negative stress test 3 years ago. Symptoms last for approximately minutes to hours. She does not take anything for this. Denies any urinary symptoms at this time. Does have a history of fibromyalgia and states that this is different. Denies any syncope or near-syncope.  Patient is a 68 y.o. female presenting with chest pain. The history is provided by the patient.  Chest Pain   Past Medical History  Diagnosis Date  . Anxiety   . Depression   . Fibromyalgia     ESR, CRP, ANA and RF all wnl.  . Hypertension   . Hyperlipidemia    Past Surgical History  Procedure Laterality Date  . Abdominal hysterectomy  age 88  . Colonoscopy  07-12-12    per Dr. Hilarie Fredrickson, clear, repeat in 10 yrs    Family History  Problem Relation Age of Onset  . Alcohol abuse Father   . Kidney disease Father     ESRD  . Cardiomyopathy Father     alcohol induced CM  . Cancer Brother     head and neck (throat)  . Hypertension Other   . Colon cancer Neg Hx    History  Substance Use Topics  . Smoking status: Former Smoker    Types: Cigarettes    Quit date: 07/29/1963  . Smokeless tobacco: Never Used  . Alcohol Use: No   OB History   Grav Para Term Preterm Abortions TAB SAB Ect Mult Living                 Review of Systems  Cardiovascular: Positive for chest pain.  All other systems reviewed and are negative.     Allergies  Caffeine;  Ciprofloxacin; Codeine; Escitalopram oxalate; Hydrocodone; and Oxybutynin chloride  Home Medications   Prior to Admission medications   Medication Sig Start Date End Date Taking? Authorizing Provider  ALPRAZolam Duanne Moron) 0.5 MG tablet Take 0.5 mg by mouth at bedtime as needed for anxiety.   Yes Historical Provider, MD  amLODipine (NORVASC) 5 MG tablet Take 1 tablet (5 mg total) by mouth daily. 08/09/13  Yes Laurey Morale, MD  fish oil-omega-3 fatty acids 1000 MG capsule Take 2 g by mouth daily.   Yes Historical Provider, MD  hydrochlorothiazide (HYDRODIURIL) 25 MG tablet Take 25 mg by mouth daily.   Yes Historical Provider, MD  Magnesium 400 MG CAPS Take by mouth daily.   Yes Historical Provider, MD  Multiple Vitamin (MULTIVITAMIN) tablet Take 1 tablet by mouth daily.   Yes Historical Provider, MD  predniSONE (DELTASONE) 10 MG tablet Take 1 tablet (10 mg total) by mouth 2 (two) times daily with a meal. 12/16/13  Yes Laurey Morale, MD  ranitidine (ZANTAC) 150 MG tablet Take 150 mg by mouth 2 (two) times daily as needed for heartburn.   Yes Historical Provider, MD  VITAMIN D, ERGOCALCIFEROL, PO Take 5,000 Units by mouth daily.   Yes Historical Provider, MD   BP 166/80  Pulse 101  Temp(Src) 98.6 F (37 C) (Oral)  Resp 18  SpO2 97%  LMP 03/31/2004 Physical Exam  Nursing note and vitals reviewed. Constitutional: She is oriented to person, place, and time. She appears well-developed and well-nourished.  Non-toxic appearance. No distress.  HENT:  Head: Normocephalic and atraumatic.  Eyes: Conjunctivae, EOM and lids are normal. Pupils are equal, round, and reactive to light.  Neck: Normal range of motion. Neck supple. No tracheal deviation present. No mass present.  Cardiovascular: Normal rate, regular rhythm and normal heart sounds.  Exam reveals no gallop.   No murmur heard. Pulmonary/Chest: Effort normal and breath sounds normal. No stridor. No respiratory distress. She has no decreased  breath sounds. She has no wheezes. She has no rhonchi. She has no rales.  Abdominal: Soft. Normal appearance and bowel sounds are normal. She exhibits no distension. There is no tenderness. There is no rebound and no CVA tenderness.  Musculoskeletal: Normal range of motion. She exhibits no edema and no tenderness.  Neurological: She is alert and oriented to person, place, and time. She has normal strength. No cranial nerve deficit or sensory deficit. GCS eye subscore is 4. GCS verbal subscore is 5. GCS motor subscore is 6.  Skin: Skin is warm and dry. No abrasion and no rash noted.  Psychiatric: She has a normal mood and affect. Her speech is normal and behavior is normal.    ED Course  Procedures (including critical care time) Labs Review Labs Reviewed  CBC WITH DIFFERENTIAL  BASIC METABOLIC PANEL  I-STAT CHEM 8, ED    Imaging Review No results found.   EKG Interpretation   Date/Time:  Saturday March 25 2014 20:58:24 EDT Ventricular Rate:  100 PR Interval:  106 QRS Duration: 101 QT Interval:  348 QTC Calculation: 449 R Axis:   75 Text Interpretation:  Sinus tachycardia Ventricular premature complex  Probable left atrial enlargement RSR' in V1 or V2, right VCD or RVH  Borderline T abnormalities, inferior leads No significant change since  last tracing Confirmed by Zenia Resides  MD, Limuel Nieblas ([id number]) on 03/25/2014 9:16:25  PM      MDM   Final diagnoses:  None   10:46 PM Pt offered admission for eval of chest pain and has deferred --risk of sudden cardiac death explained to pt and she accepts--will be seen by her pcp on  monday     Leota Jacobsen, MD 03/25/14 2248  Leota Jacobsen, MD 03/25/14 2248

## 2014-03-27 ENCOUNTER — Ambulatory Visit (INDEPENDENT_AMBULATORY_CARE_PROVIDER_SITE_OTHER): Payer: Commercial Managed Care - HMO | Admitting: Family Medicine

## 2014-03-27 ENCOUNTER — Encounter: Payer: Self-pay | Admitting: Family Medicine

## 2014-03-27 VITALS — BP 159/84 | HR 74 | Temp 98.2°F | Ht 69.0 in | Wt 132.0 lb

## 2014-03-27 DIAGNOSIS — R079 Chest pain, unspecified: Secondary | ICD-10-CM | POA: Insufficient documentation

## 2014-03-27 DIAGNOSIS — I10 Essential (primary) hypertension: Secondary | ICD-10-CM

## 2014-03-27 DIAGNOSIS — F3289 Other specified depressive episodes: Secondary | ICD-10-CM

## 2014-03-27 DIAGNOSIS — F329 Major depressive disorder, single episode, unspecified: Secondary | ICD-10-CM

## 2014-03-27 DIAGNOSIS — IMO0001 Reserved for inherently not codable concepts without codable children: Secondary | ICD-10-CM

## 2014-03-27 MED ORDER — DIAZEPAM 5 MG PO TABS: 5.0000 mg | ORAL_TABLET | Freq: Two times a day (BID) | ORAL | Status: DC | PRN

## 2014-03-27 NOTE — Progress Notes (Signed)
Pre visit review using our clinic review tool, if applicable. No additional management support is needed unless otherwise documented below in the visit note. 

## 2014-03-27 NOTE — Progress Notes (Signed)
   Subjective:    Patient ID: [name], female    DOB: [birth year], 68 y.o.   MRN: [id number]  HPI Here to follow up an ER visit on 03-25-14 for chest pains. Her workup was negative and she was sent home. It sounds to me like stress had a lot to do with this and she agrees. She has been talking Xanax for several years but it does not relax her like it used to. Her sister suggests trying Valium instead. She has had no further chest discomfort since going home.    Review of Systems  Constitutional: Negative.   Respiratory: Negative.   Cardiovascular: Negative.   Psychiatric/Behavioral: Positive for dysphoric mood. Negative for confusion and agitation. The patient is nervous/anxious.        Objective:   Physical Exam  Constitutional: She appears well-developed and well-nourished.  Cardiovascular: Normal rate, regular rhythm, normal heart sounds and intact distal pulses.   Pulmonary/Chest: Effort normal and breath sounds normal.          Assessment & Plan:  Her chest discomfort seems to be related to anxiety. We will switch from Xanax to Valium bid.

## 2014-09-07 ENCOUNTER — Other Ambulatory Visit: Payer: Self-pay | Admitting: Family Medicine

## 2014-09-07 DIAGNOSIS — Z1231 Encounter for screening mammogram for malignant neoplasm of breast: Secondary | ICD-10-CM

## 2014-10-09 ENCOUNTER — Ambulatory Visit (HOSPITAL_COMMUNITY)
Admission: RE | Admit: 2014-10-09 | Discharge: 2014-10-09 | Disposition: A | Discharge status: Home / Self Care | Payer: Commercial Managed Care - HMO | Source: Ambulatory Visit | Attending: Family Medicine | Admitting: Family Medicine

## 2014-10-09 DIAGNOSIS — Z1231 Encounter for screening mammogram for malignant neoplasm of breast: Secondary | ICD-10-CM | POA: Diagnosis not present

## 2014-11-07 ENCOUNTER — Ambulatory Visit (INDEPENDENT_AMBULATORY_CARE_PROVIDER_SITE_OTHER): Payer: Commercial Managed Care - HMO | Admitting: Family Medicine

## 2014-11-07 ENCOUNTER — Encounter: Payer: Self-pay | Admitting: Family Medicine

## 2014-11-07 VITALS — BP 168/89 | HR 78 | Temp 98.7°F | Ht 69.0 in | Wt 130.0 lb

## 2014-11-07 DIAGNOSIS — M791 Myalgia: Secondary | ICD-10-CM

## 2014-11-07 DIAGNOSIS — F329 Major depressive disorder, single episode, unspecified: Secondary | ICD-10-CM

## 2014-11-07 DIAGNOSIS — F411 Generalized anxiety disorder: Secondary | ICD-10-CM

## 2014-11-07 DIAGNOSIS — M609 Myositis, unspecified: Secondary | ICD-10-CM

## 2014-11-07 DIAGNOSIS — I10 Essential (primary) hypertension: Secondary | ICD-10-CM

## 2014-11-07 DIAGNOSIS — F32A Depression, unspecified: Secondary | ICD-10-CM

## 2014-11-07 DIAGNOSIS — IMO0001 Reserved for inherently not codable concepts without codable children: Secondary | ICD-10-CM

## 2014-11-07 LAB — LIPID PANEL
CHOLESTEROL: 210 mg/dL — AB (ref 0–200)
HDL: 54.1 mg/dL (ref >39.00)
LDL Cholesterol: 139 mg/dL — ABNORMAL HIGH (ref 0–99)
NONHDL: 155.9
Total CHOL/HDL Ratio: 4
Triglycerides: 86 mg/dL (ref 0.0–149.0)
VLDL: 17.2 mg/dL (ref 0.0–40.0)

## 2014-11-07 LAB — CBC WITH DIFFERENTIAL/PLATELET
BASOS PCT: 0.7 % (ref 0.0–3.0)
Basophils Absolute: 0 10*3/uL (ref 0.0–0.1)
Eosinophils Absolute: 0.1 10*3/uL (ref 0.0–0.7)
Eosinophils Relative: 2.2 % (ref 0.0–5.0)
HCT: 38.3 % (ref 36.0–46.0)
Hemoglobin: 13 g/dL (ref 12.0–15.0)
Lymphocytes Relative: 19.8 % (ref 12.0–46.0)
Lymphs Abs: 1.1 10*3/uL (ref 0.7–4.0)
MCHC: 34 g/dL (ref 30.0–36.0)
MCV: 91.6 fl (ref 78.0–100.0)
MONO ABS: 0.5 10*3/uL (ref 0.1–1.0)
Monocytes Relative: 8.4 % (ref 3.0–12.0)
NEUTROS PCT: 68.9 % (ref 43.0–77.0)
Neutro Abs: 3.9 10*3/uL (ref 1.4–7.7)
PLATELETS: 180 10*3/uL (ref 150.0–400.0)
RBC: 4.18 Mil/uL (ref 3.87–5.11)
RDW: 13.3 % (ref 11.5–15.5)
WBC: 5.7 10*3/uL (ref 4.0–10.5)

## 2014-11-07 LAB — BASIC METABOLIC PANEL
BUN: 6 mg/dL (ref 6–23)
CHLORIDE: 105 meq/L (ref 96–112)
CO2: 29 mEq/L (ref 19–32)
CREATININE: 0.79 mg/dL (ref 0.40–1.20)
Calcium: 9.1 mg/dL (ref 8.4–10.5)
GFR: 92.98 mL/min (ref >60.00)
Glucose, Bld: 101 mg/dL — ABNORMAL HIGH (ref 70–99)
POTASSIUM: 3.2 meq/L — AB (ref 3.5–5.1)
Sodium: 140 mEq/L (ref 135–145)

## 2014-11-07 LAB — HEPATIC FUNCTION PANEL
ALT: 16 U/L (ref 0–35)
AST: 15 U/L (ref 0–37)
Albumin: 3.9 g/dL (ref 3.5–5.2)
Alkaline Phosphatase: 77 U/L (ref 39–117)
BILIRUBIN TOTAL: 0.5 mg/dL (ref 0.2–1.2)
Bilirubin, Direct: 0.1 mg/dL (ref 0.0–0.3)
TOTAL PROTEIN: 6.7 g/dL (ref 6.0–8.3)

## 2014-11-07 LAB — TSH: TSH: 1.24 u[IU]/mL (ref 0.35–4.50)

## 2014-11-07 MED ORDER — HYDROCHLOROTHIAZIDE 25 MG PO TABS: 25.0000 mg | ORAL_TABLET | Freq: Every day | ORAL | Status: DC

## 2014-11-07 MED ORDER — PREDNISONE 10 MG PO TABS: 10.0000 mg | ORAL_TABLET | Freq: Two times a day (BID) | ORAL | Status: DC

## 2014-11-07 MED ORDER — ALPRAZOLAM 0.5 MG PO TABS: 0.5000 mg | ORAL_TABLET | Freq: Three times a day (TID) | ORAL | Status: DC | PRN

## 2014-11-07 NOTE — Progress Notes (Signed)
   Subjective:    Patient ID: [name], female    DOB: Dec 27, 1945, 69 y.o.   MRN: [id number]  HPI Here for follow up. Her BP has been stable at home. She is fasting for labs. Her fibromyalgia has been her only problem to deal with. She has stiffness and pain almost every day. Still on a total of 20 mg of prednisone daily.    Review of Systems  Constitutional: Negative.   Respiratory: Negative.   Cardiovascular: Negative.   Musculoskeletal: Positive for myalgias.  Neurological: Negative.        Objective:   Physical Exam  Constitutional: She appears well-developed and well-nourished.  Neck: No thyromegaly present.  Cardiovascular: Normal rate, regular rhythm, normal heart sounds and intact distal pulses.   Pulmonary/Chest: Effort normal and breath sounds normal.  Lymphadenopathy:    She has no cervical adenopathy.          Assessment & Plan:  Get labs today. Refilled current meds. Encouraged her to get as much exercise as possible.

## 2014-11-08 LAB — POCT URINALYSIS DIPSTICK
Bilirubin, UA: NEGATIVE
Clarity, UA: NEGATIVE
GLUCOSE UA: NEGATIVE
Ketones, UA: 1.015
NITRITE UA: NEGATIVE
PH UA: 7
Protein, UA: NEGATIVE
Spec Grav, UA: 1.015
UROBILINOGEN UA: 0.2

## 2014-11-08 MED ORDER — SULFAMETHOXAZOLE-TRIMETHOPRIM 800-160 MG PO TABS: 1.0000 | ORAL_TABLET | Freq: Two times a day (BID) | ORAL | Status: DC

## 2014-11-08 NOTE — Addendum Note (Signed)
Addended by: Aggie Hacker A on: 11/08/2014 04:36 PM   Modules accepted: Orders

## 2015-01-30 ENCOUNTER — Emergency Department (HOSPITAL_COMMUNITY)
Admission: EM | Admit: 2015-01-30 | Discharge: 2015-01-30 | Disposition: A | Discharge status: Home / Self Care | Payer: Commercial Managed Care - HMO | Attending: Emergency Medicine | Admitting: Emergency Medicine

## 2015-01-30 ENCOUNTER — Emergency Department (HOSPITAL_COMMUNITY): Payer: Commercial Managed Care - HMO

## 2015-01-30 ENCOUNTER — Encounter (HOSPITAL_COMMUNITY): Payer: Self-pay | Admitting: *Deleted

## 2015-01-30 DIAGNOSIS — Z7952 Long term (current) use of systemic steroids: Secondary | ICD-10-CM | POA: Diagnosis not present

## 2015-01-30 DIAGNOSIS — Z87891 Personal history of nicotine dependence: Secondary | ICD-10-CM | POA: Insufficient documentation

## 2015-01-30 DIAGNOSIS — F419 Anxiety disorder, unspecified: Secondary | ICD-10-CM | POA: Diagnosis not present

## 2015-01-30 DIAGNOSIS — I10 Essential (primary) hypertension: Secondary | ICD-10-CM | POA: Insufficient documentation

## 2015-01-30 DIAGNOSIS — R079 Chest pain, unspecified: Secondary | ICD-10-CM | POA: Diagnosis not present

## 2015-01-30 DIAGNOSIS — E785 Hyperlipidemia, unspecified: Secondary | ICD-10-CM | POA: Diagnosis not present

## 2015-01-30 DIAGNOSIS — Z79899 Other long term (current) drug therapy: Secondary | ICD-10-CM | POA: Diagnosis not present

## 2015-01-30 DIAGNOSIS — F329 Major depressive disorder, single episode, unspecified: Secondary | ICD-10-CM | POA: Diagnosis not present

## 2015-01-30 DIAGNOSIS — Z792 Long term (current) use of antibiotics: Secondary | ICD-10-CM | POA: Insufficient documentation

## 2015-01-30 DIAGNOSIS — M797 Fibromyalgia: Secondary | ICD-10-CM | POA: Diagnosis not present

## 2015-01-30 LAB — BASIC METABOLIC PANEL
Anion gap: 8 (ref 5–15)
BUN: 12 mg/dL (ref 6–20)
CALCIUM: 8.9 mg/dL (ref 8.9–10.3)
CO2: 27 mmol/L (ref 22–32)
Chloride: 104 mmol/L (ref 101–111)
Creatinine, Ser: 0.75 mg/dL (ref 0.44–1.00)
GFR calc Af Amer: >60 mL/min (ref >60)
Glucose, Bld: 92 mg/dL (ref 65–99)
Potassium: 3.3 mmol/L — ABNORMAL LOW (ref 3.5–5.1)
SODIUM: 139 mmol/L (ref 135–145)

## 2015-01-30 LAB — CBC
HEMATOCRIT: 39.2 % (ref 36.0–46.0)
HEMOGLOBIN: 12.9 g/dL (ref 12.0–15.0)
MCH: 30.6 pg (ref 26.0–34.0)
MCHC: 32.9 g/dL (ref 30.0–36.0)
MCV: 93.1 fL (ref 78.0–100.0)
Platelets: 186 10*3/uL (ref 150–400)
RBC: 4.21 MIL/uL (ref 3.87–5.11)
RDW: 12.5 % (ref 11.5–15.5)
WBC: 9.1 10*3/uL (ref 4.0–10.5)

## 2015-01-30 LAB — I-STAT CHEM 8, ED
BUN: 10 mg/dL (ref 6–20)
Calcium, Ion: 1.12 mmol/L — ABNORMAL LOW (ref 1.13–1.30)
Chloride: 103 mmol/L (ref 101–111)
Creatinine, Ser: 0.9 mg/dL (ref 0.44–1.00)
GLUCOSE: 92 mg/dL (ref 65–99)
HEMATOCRIT: 40 % (ref 36.0–46.0)
HEMOGLOBIN: 13.6 g/dL (ref 12.0–15.0)
Potassium: 3.2 mmol/L — ABNORMAL LOW (ref 3.5–5.1)
Sodium: 140 mmol/L (ref 135–145)
TCO2: 27 mmol/L (ref 0–100)

## 2015-01-30 LAB — I-STAT TROPONIN, ED: Troponin i, poc: 0 ng/mL (ref 0.00–0.08)

## 2015-01-30 NOTE — ED Provider Notes (Signed)
CSN: [id number]     Arrival date & time 01/30/15  6433 History   First MD Initiated Contact with Patient 01/30/15 [phone]     Chief Complaint  Patient presents with  . Chest Pain      HPI Pt states that she has had chest pain off and on for awhile; pt states that she currently has had chest pain for 2 months; pt c/o feeling short of breath at times; pt states that the pain is to the left chest to center chest radiating to left shoulder.  Patient has no active chest pain now.  She says the pain is intermittent.  It can come at rest or during exertion.  Patient has had normal stress test done in the past. Past Medical History  Diagnosis Date  . Anxiety   . Depression   . Fibromyalgia     ESR, CRP, ANA and RF all wnl.  . Hypertension   . Hyperlipidemia    Past Surgical History  Procedure Laterality Date  . Abdominal hysterectomy  age 73  . Colonoscopy  07-12-12    per Dr. Hilarie Fredrickson, clear, repeat in 10 yrs   . Hand surgery     Family History  Problem Relation Age of Onset  . Alcohol abuse Father   . Kidney disease Father     ESRD  . Cardiomyopathy Father     alcohol induced CM  . Cancer Brother     head and neck (throat)  . Hypertension Other   . Colon cancer Neg Hx    History  Substance Use Topics  . Smoking status: Former Smoker    Types: Cigarettes    Quit date: 07/29/1963  . Smokeless tobacco: Never Used  . Alcohol Use: No   OB History    No data available     Review of Systems  All other systems reviewed and are negative.     Allergies  Ciprofloxacin; Codeine; Escitalopram oxalate; Hydrocodone; and Oxybutynin chloride  Home Medications   Prior to Admission medications   Medication Sig Start Date End Date Taking? Authorizing Provider  ALPRAZolam Duanne Moron) 0.5 MG tablet Take 1 tablet (0.5 mg total) by mouth 3 (three) times daily as needed for anxiety. 11/07/14  Yes Laurey Morale, MD  amLODipine (NORVASC) 5 MG tablet Take 1 tablet (5 mg total) by mouth daily.  08/09/13  Yes Laurey Morale, MD  fish oil-omega-3 fatty acids 1000 MG capsule Take 2 g by mouth daily.   Yes Historical Provider, MD  hydrochlorothiazide (HYDRODIURIL) 25 MG tablet Take 1 tablet (25 mg total) by mouth daily. 11/07/14  Yes Laurey Morale, MD  Magnesium 400 MG CAPS Take by mouth daily.   Yes Historical Provider, MD  Multiple Vitamin (MULTIVITAMIN) tablet Take 1 tablet by mouth daily.   Yes Historical Provider, MD  predniSONE (DELTASONE) 10 MG tablet Take 1 tablet (10 mg total) by mouth 2 (two) times daily with a meal. 11/07/14  Yes Laurey Morale, MD  ranitidine (ZANTAC) 150 MG tablet Take 150 mg by mouth 2 (two) times daily as needed for heartburn.   Yes Historical Provider, MD  sulfamethoxazole-trimethoprim (BACTRIM DS,SEPTRA DS) 800-160 MG per tablet Take 1 tablet by mouth 2 (two) times daily. Patient not taking: Reported on 01/30/2015 11/08/14   Laurey Morale, MD   BP 185/99 mmHg  Pulse 77  Temp(Src) 98.9 F (37.2 C) (Oral)  Resp 22  Ht '5\' 9"'  (1.753 m)  Wt 135 lb (61.236 kg)  BMI 19.93 kg/m2  SpO2 98%  LMP 03/31/2004 Physical Exam  Constitutional: She is oriented to person, place, and time. She appears well-developed and well-nourished. No distress.  HENT:  Head: Normocephalic and atraumatic.  Eyes: Pupils are equal, round, and reactive to light.  Neck: Normal range of motion.  Cardiovascular: Normal rate and intact distal pulses.   Pulmonary/Chest: No respiratory distress.  Abdominal: Normal appearance. She exhibits no distension. There is no tenderness.  Musculoskeletal: Normal range of motion.  Neurological: She is alert and oriented to person, place, and time. No cranial nerve deficit.  Skin: Skin is warm and dry. No rash noted.  Psychiatric: She has a normal mood and affect. Her behavior is normal.  Nursing note and vitals reviewed.   ED Course  Procedures (including critical care time) Labs Review Labs Reviewed  BASIC METABOLIC PANEL - Abnormal; Notable for  the following:    Potassium 3.3 (*)    All other components within normal limits  I-STAT CHEM 8, ED - Abnormal; Notable for the following:    Potassium 3.2 (*)    Calcium, Ion 1.12 (*)    All other components within normal limits  CBC  I-STAT TROPOININ, ED    Imaging Review Dg Chest 2 View  01/30/2015   CLINICAL DATA:  69 year old female with central chest pain radiating to the left side and left shoulder x2 months, but increasing x2 days. Shortness breath. Initial encounter.  EXAM: CHEST  2 VIEW  COMPARISON:  03/25/2014 and earlier.  FINDINGS: Lung volumes are stable and within normal limits. Normal cardiac size and mediastinal contours. Visualized tracheal air column is within normal limits. Chronic apical pleural/ parenchymal scarring. Calcifications projecting over the lung apices could be related to scarring or calcified atherosclerosis. No pneumothorax or pulmonary edema. No pleural effusion or confluent pulmonary opacity. No acute osseous abnormality identified.  IMPRESSION: No acute cardiopulmonary abnormality.   Electronically Signed   By: Genevie Ann M.D.   On: 01/30/2015 07:37     EKG Interpretation   Date/Time:  Tuesday January 30 2015 07:00:14 EDT Ventricular Rate:  79 PR Interval:  116 QRS Duration: 97 QT Interval:  372 QTC Calculation: 426 R Axis:   61 Text Interpretation:  Sinus arrhythmia Borderline short PR interval  Probable left atrial enlargement RSR' in V1 or V2, right VCD or RVH  Borderline T abnormalities, diffuse leads Baseline wander in lead(s) V2  Confirmed by Amel Kitch  MD, Zaylei Mullane ([id number]) on 01/30/2015 7:17:31 AM     I recommended this patient be admitted to hospital informed her that her heart score was equal to 5.  Patient does not want to come in and states she would take her medication and follow-up with her family physician tomorrow.  I recommended she do want daily aspirin come back to emergency room if she has persistent pain or worsening conditions. MDM    Final diagnoses:  Chest pain        Leonard Schwartz, MD 01/30/15 [phone]

## 2015-01-30 NOTE — Discharge Instructions (Signed)
Chest Pain (Nonspecific) °It is often hard to give a specific diagnosis for the cause of chest pain. There is always a chance that your pain could be related to something serious, such as a heart attack or a blood clot in the lungs. You need to follow up with your health care provider for further evaluation. °CAUSES  °· Heartburn. °· Pneumonia or bronchitis. °· Anxiety or stress. °· Inflammation around your heart (pericarditis) or lung (pleuritis or pleurisy). °· A blood clot in the lung. °· A collapsed lung (pneumothorax). It can develop suddenly on its own (spontaneous pneumothorax) or from trauma to the chest. °· Shingles infection (herpes zoster virus). °The chest wall is composed of bones, muscles, and cartilage. Any of these can be the source of the pain. °· The bones can be bruised by injury. °· The muscles or cartilage can be strained by coughing or overwork. °· The cartilage can be affected by inflammation and become sore (costochondritis). °DIAGNOSIS  °Lab tests or other studies may be needed to find the cause of your pain. Your health care provider may have you take a test called an ambulatory electrocardiogram (ECG). An ECG records your heartbeat patterns over a 24-hour period. You may also have other tests, such as: °· Transthoracic echocardiogram (TTE). During echocardiography, sound waves are used to evaluate how blood flows through your heart. °· Transesophageal echocardiogram (TEE). °· Cardiac monitoring. This allows your health care provider to monitor your heart rate and rhythm in real time. °· Holter monitor. This is a portable device that records your heartbeat and can help diagnose heart arrhythmias. It allows your health care provider to track your heart activity for several days, if needed. °· Stress tests by exercise or by giving medicine that makes the heart beat faster. °TREATMENT  °· Treatment depends on what may be causing your chest pain. Treatment may include: °· Acid blockers for  heartburn. °· Anti-inflammatory medicine. °· Pain medicine for inflammatory conditions. °· Antibiotics if an infection is present. °· You may be advised to change lifestyle habits. This includes stopping smoking and avoiding alcohol, caffeine, and chocolate. °· You may be advised to keep your head raised (elevated) when sleeping. This reduces the chance of acid going backward from your stomach into your esophagus. °Most of the time, nonspecific chest pain will improve within 2-3 days with rest and mild pain medicine.  °HOME CARE INSTRUCTIONS  °· If antibiotics were prescribed, take them as directed. Finish them even if you start to feel better. °· For the next few days, avoid physical activities that bring on chest pain. Continue physical activities as directed. °· Do not use any tobacco products, including cigarettes, chewing tobacco, or electronic cigarettes. °· Avoid drinking alcohol. °· Only take medicine as directed by your health care provider. °· Follow your health care provider's suggestions for further testing if your chest pain does not go away. °· Keep any follow-up appointments you made. If you do not go to an appointment, you could develop lasting (chronic) problems with pain. If there is any problem keeping an appointment, call to reschedule. °SEEK MEDICAL CARE IF:  °· Your chest pain does not go away, even after treatment. °· You have a rash with blisters on your chest. °· You have a fever. °SEEK IMMEDIATE MEDICAL CARE IF:  °· You have increased chest pain or pain that spreads to your arm, neck, jaw, back, or abdomen. °· You have shortness of breath. °· You have an increasing cough, or you cough   up blood.  You have severe back or abdominal pain.  You feel nauseous or vomit.  You have severe weakness.  You faint.  You have chills. This is an emergency. Do not wait to see if the pain will go away. Get medical help at once. Call your local emergency services (911 in [place] Do not drive  yourself to the hospital. MAKE SURE YOU:   Understand these instructions.  Will watch your condition.  Will get help right away if you are not doing well or get worse. Document Released: 04/23/2005 Document Revised: 07/19/2013 Document Reviewed: 02/17/2008 [facility] Patient Information 2015 [place], [place]. This information is not intended to replace advice given to you by your health care provider. Make sure you discuss any questions you have with your health care provider.  Aspirin and Your Heart Aspirin affects the way your blood clots and helps "thin" the blood. Aspirin has many uses in heart disease. It may be used as a primary prevention to help reduce the risk of heart related events. It also can be used as a secondary measure to prevent more heart attacks or to prevent additional damage from blood clots.  ASPIRIN MAY HELP IF YOU:  Have had a heart attack or chest pain.  Have undergone open heart surgery such as CABG (Coronary Artery Bypass Surgery).  Have had coronary angioplasty with or without stents.  Have experienced a stroke or TIA (transient ischemic attack).  Have peripheral vascular disease (PAD).  Have chronic heart rhythm problems such as atrial fibrillation.  Are at risk for heart disease. BEFORE STARTING ASPIRIN Before you start taking aspirin, your caregiver will need to review your medical history. Many things will need to be taken into consideration, such as:  Smoking status.  Blood pressure.  Diabetes.  Gender.  Weight.  Cholesterol level. ASPIRIN DOSES  Aspirin should only be taken on the advice of your caregiver. Talk to your caregiver about how much aspirin you should take. Aspirin comes in different doses such as:  81 mg.  162 mg.  325 mg.  The aspirin dose you take may be affected by many factors, some of which include:  Your current medications, especially if your are taking blood-thinners or anti-platelet medicine.  Liver  function.  Heart disease risk.  Age.  Aspirin comes in two forms:  Non-enteric-coated. This type of aspirin does not have a coating and is absorbed faster. Non-enteric coated aspirin is recommended for patients experiencing chest pain symptoms. This type of aspirin also comes in a chewable form.  Enteric-coated. This means the aspirin has a special coating that releases the medicine very slowly. Enteric-coated aspirin causes less stomach upset. This type of aspirin should not be chewed or crushed. ASPIRIN SIDE EFFECTS Daily use of aspirin can increase your risk of serious side effects. Some of these include:  Increased bleeding. This can range from a cut that does not stop bleeding to more serious problems such as stomach bleeding or bleeding into the brain (Intracerebral bleeding).  Increased bruising.  Stomach upset.  An allergic reaction such as red, itchy skin.  Increased risk of bleeding when combined with non-steroidal anti-inflammatory medicine (NSAIDS).  Alcohol should be drank in moderation when taking aspirin. Alcohol can increase the risk of stomach bleeding when taken with aspirin.  Aspirin should not be given to children less than 50 years of age due to the association of Reye syndrome. Reye syndrome is a serious illness that can affect the brain and liver. Studies have linked  Reye syndrome with aspirin use in children.  People that have nasal polyps have an increased risk of developing an aspirin allergy. SEEK MEDICAL CARE IF:   You develop an allergic reaction such as:  Hives.  Itchy skin.  Swelling of the lips, tongue or face.  You develop stomach pain.  You have unusual bleeding or bruising.  You have ringing in your ears. SEEK IMMEDIATE MEDICAL CARE IF:   You have severe chest pain, especially if the pain is crushing or pressure-like and spreads to the arms, back, neck, or jaw. THIS IS AN EMERGENCY. Do not wait to see if the pain will go away. Get  medical help at once. Call your local emergency services (911 in the [place] DO NOT drive yourself to the hospital.  You have stroke-like symptoms such as:  Loss of vision.  Difficulty talking.  Numbness or weakness on one side of your body.  Numbness or weakness in your arm or leg.  Not thinking clearly or feeling confused.  Your bowel movements are bloody, dark red or black in color.  You vomit or cough up blood.  You have blood in your urine.  You have shortness of breath, coughing or wheezing. MAKE SURE YOU:   Understand these instructions.  Will monitor your condition.  Seek immediate medical care if necessary. Document Released: 06/26/2008 Document Revised: 11/08/2012 Document Reviewed: 10/19/2013 [facility] Patient Information 2015 [place], [place]. This information is not intended to replace advice given to you by your health care provider. Make sure you discuss any questions you have with your health care provider.

## 2015-01-30 NOTE — ED Notes (Signed)
Pt states that she has had chest pain off and on for awhile; pt states that she currently has had chest pain for 2 months; pt c/o feeling short of breath at times; pt states that the pain is to the left chest to center chest radiating to left shoulder

## 2015-02-22 ENCOUNTER — Other Ambulatory Visit: Payer: Self-pay | Admitting: Family Medicine

## 2015-03-06 ENCOUNTER — Ambulatory Visit: Payer: Commercial Managed Care - HMO | Admitting: Family Medicine

## 2015-03-12 ENCOUNTER — Encounter: Payer: Self-pay | Admitting: Family Medicine

## 2015-03-12 ENCOUNTER — Ambulatory Visit (INDEPENDENT_AMBULATORY_CARE_PROVIDER_SITE_OTHER): Payer: Commercial Managed Care - HMO | Admitting: Family Medicine

## 2015-03-12 VITALS — BP 155/86 | HR 75 | Temp 98.4°F | Ht 69.0 in | Wt 132.0 lb

## 2015-03-12 DIAGNOSIS — E876 Hypokalemia: Secondary | ICD-10-CM | POA: Diagnosis not present

## 2015-03-12 DIAGNOSIS — M609 Myositis, unspecified: Secondary | ICD-10-CM

## 2015-03-12 DIAGNOSIS — IMO0001 Reserved for inherently not codable concepts without codable children: Secondary | ICD-10-CM

## 2015-03-12 DIAGNOSIS — R0789 Other chest pain: Secondary | ICD-10-CM | POA: Diagnosis not present

## 2015-03-12 DIAGNOSIS — M791 Myalgia: Secondary | ICD-10-CM

## 2015-03-12 DIAGNOSIS — I10 Essential (primary) hypertension: Secondary | ICD-10-CM | POA: Diagnosis not present

## 2015-03-12 MED ORDER — ASPIRIN EC 81 MG PO TBEC: 81.0000 mg | DELAYED_RELEASE_TABLET | Freq: Every day | ORAL | Status: AC

## 2015-03-12 MED ORDER — POTASSIUM CHLORIDE 20 MEQ PO PACK: 20.0000 meq | PACK | Freq: Every day | ORAL | Status: DC

## 2015-03-12 MED ORDER — AMLODIPINE BESYLATE 5 MG PO TABS: 5.0000 mg | ORAL_TABLET | Freq: Every day | ORAL | Status: DC

## 2015-03-12 NOTE — Addendum Note (Signed)
Addended by: Alysia Penna A on: 03/12/2015 10:21 AM   Modules accepted: Orders

## 2015-03-12 NOTE — Progress Notes (Signed)
Pre visit review using our clinic review tool, if applicable. No additional management support is needed unless otherwise documented below in the visit note. 

## 2015-03-12 NOTE — Progress Notes (Signed)
   Subjective:    Patient ID: [name], female    DOB: April 08, 1946, 69 y.o.   MRN: [id number]  HPI Here to follow up an ER visit on 01-30-15 for left sided chest pain. Her workup was normal at that time and it was recommended that she be admitted. However she declined and agreed to see [place]. Also her potassium was low at 3.2 but this was not addressed. Her chest pain has resolved but she is fatigued and has a lot of muscle cramps. Her BP has been stable.    Review of Systems  Constitutional: Positive for fatigue.  Respiratory: Negative.   Cardiovascular: Negative.   Musculoskeletal: Positive for myalgias.  Neurological: Negative.        Objective:   Physical Exam  Constitutional: She is oriented to person, place, and time. She appears well-developed and well-nourished.  Neck: No thyromegaly present.  Cardiovascular: Normal rate, regular rhythm, normal heart sounds and intact distal pulses.   Pulmonary/Chest: Effort normal and breath sounds normal.  Musculoskeletal: Normal range of motion. She exhibits no edema.  Mild diffuse tenderness   Lymphadenopathy:    She has no cervical adenopathy.  Neurological: She is alert and oriented to person, place, and time.          Assessment & Plan:  Her HTN is stable. I asked her to start taking aspirin 81 mg daily. Start on 20 mEq of potassium daily and recheck in one month

## 2015-03-13 ENCOUNTER — Telehealth: Payer: Self-pay | Admitting: Family Medicine

## 2015-03-13 NOTE — Addendum Note (Signed)
Addended by: Aggie Hacker A on: 03/13/2015 04:13 PM   Modules accepted: Medications

## 2015-03-13 NOTE — Telephone Encounter (Signed)
Pt was seen yesterday and potassium chloride 20 meq packets. Pt took one pack and started throw up. Please advise

## 2015-03-13 NOTE — Telephone Encounter (Signed)
Per Dr. Sarajane Jews, take 1/2 packet and with food. I spoke with pt and gave this advice.

## 2015-03-19 ENCOUNTER — Encounter (HOSPITAL_COMMUNITY): Payer: Self-pay

## 2015-03-19 ENCOUNTER — Emergency Department (HOSPITAL_COMMUNITY)
Admission: EM | Admit: 2015-03-19 | Discharge: 2015-03-19 | Disposition: A | Discharge status: Home / Self Care | Payer: Commercial Managed Care - HMO | Attending: Emergency Medicine | Admitting: Emergency Medicine

## 2015-03-19 DIAGNOSIS — R55 Syncope and collapse: Secondary | ICD-10-CM | POA: Diagnosis present

## 2015-03-19 DIAGNOSIS — N390 Urinary tract infection, site not specified: Secondary | ICD-10-CM | POA: Insufficient documentation

## 2015-03-19 DIAGNOSIS — Z79899 Other long term (current) drug therapy: Secondary | ICD-10-CM | POA: Diagnosis not present

## 2015-03-19 DIAGNOSIS — F329 Major depressive disorder, single episode, unspecified: Secondary | ICD-10-CM | POA: Diagnosis not present

## 2015-03-19 DIAGNOSIS — Z8739 Personal history of other diseases of the musculoskeletal system and connective tissue: Secondary | ICD-10-CM | POA: Diagnosis not present

## 2015-03-19 DIAGNOSIS — Z8639 Personal history of other endocrine, nutritional and metabolic disease: Secondary | ICD-10-CM | POA: Diagnosis not present

## 2015-03-19 DIAGNOSIS — Z87891 Personal history of nicotine dependence: Secondary | ICD-10-CM | POA: Insufficient documentation

## 2015-03-19 DIAGNOSIS — I10 Essential (primary) hypertension: Secondary | ICD-10-CM | POA: Insufficient documentation

## 2015-03-19 DIAGNOSIS — F419 Anxiety disorder, unspecified: Secondary | ICD-10-CM | POA: Insufficient documentation

## 2015-03-19 LAB — URINALYSIS, ROUTINE W REFLEX MICROSCOPIC
Bilirubin Urine: NEGATIVE
GLUCOSE, UA: NEGATIVE mg/dL
Ketones, ur: NEGATIVE mg/dL
NITRITE: NEGATIVE
Protein, ur: NEGATIVE mg/dL
Specific Gravity, Urine: 1.014 (ref 1.005–1.030)
UROBILINOGEN UA: 1 mg/dL (ref 0.0–1.0)
pH: 7 (ref 5.0–8.0)

## 2015-03-19 LAB — URINE MICROSCOPIC-ADD ON

## 2015-03-19 LAB — CBC
HCT: 38 % (ref 36.0–46.0)
HEMOGLOBIN: 12.5 g/dL (ref 12.0–15.0)
MCH: 31.1 pg (ref 26.0–34.0)
MCHC: 32.9 g/dL (ref 30.0–36.0)
MCV: 94.5 fL (ref 78.0–100.0)
PLATELETS: 157 10*3/uL (ref 150–400)
RBC: 4.02 MIL/uL (ref 3.87–5.11)
RDW: 12.5 % (ref 11.5–15.5)
WBC: 9.3 10*3/uL (ref 4.0–10.5)

## 2015-03-19 LAB — BASIC METABOLIC PANEL
Anion gap: 5 (ref 5–15)
BUN: 9 mg/dL (ref 6–20)
CALCIUM: 9 mg/dL (ref 8.9–10.3)
CO2: 28 mmol/L (ref 22–32)
CREATININE: 0.75 mg/dL (ref 0.44–1.00)
Chloride: 107 mmol/L (ref 101–111)
Glucose, Bld: 94 mg/dL (ref 65–99)
Potassium: 3.2 mmol/L — ABNORMAL LOW (ref 3.5–5.1)
SODIUM: 140 mmol/L (ref 135–145)

## 2015-03-19 LAB — CBG MONITORING, ED: GLUCOSE-CAPILLARY: 92 mg/dL (ref 65–99)

## 2015-03-19 MED ORDER — POTASSIUM CHLORIDE 20 MEQ/15ML (10%) PO SOLN
40.0000 meq | Freq: Once | ORAL | Status: DC
Start: 2015-03-19 — End: 2015-03-19
  Filled 2015-03-19: qty 30

## 2015-03-19 MED ORDER — SULFAMETHOXAZOLE-TRIMETHOPRIM 800-160 MG PO TABS: 1.0000 | ORAL_TABLET | Freq: Two times a day (BID) | ORAL | Status: AC

## 2015-03-19 MED ORDER — SODIUM CHLORIDE 0.9 % IV BOLUS (SEPSIS)
1000.0000 mL | Freq: Once | INTRAVENOUS | Status: AC
  Administered 2015-03-19: 1000 mL via INTRAVENOUS

## 2015-03-19 MED ORDER — CEFTRIAXONE SODIUM 1 G IJ SOLR
1.0000 g | Freq: Once | INTRAMUSCULAR | Status: AC
  Administered 2015-03-19: 1 g via INTRAVENOUS
  Filled 2015-03-19: qty 10

## 2015-03-19 MED ORDER — POTASSIUM CHLORIDE 20 MEQ/15ML (10%) PO SOLN
40.0000 meq | Freq: Once | ORAL | Status: AC
  Administered 2015-03-19: 40 meq via ORAL

## 2015-03-19 MED ORDER — POTASSIUM CHLORIDE CRYS ER 20 MEQ PO TBCR
40.0000 meq | EXTENDED_RELEASE_TABLET | Freq: Once | ORAL | Status: DC
  Filled 2015-03-19: qty 2

## 2015-03-19 NOTE — ED Notes (Signed)
MD at bedside. 

## 2015-03-19 NOTE — ED Notes (Signed)
Pt has been weak and sick x 2 months.  Pt was seen a month ago for chest pain.  Has chronic generalized pain with fibromyalgia.  Today was at home and started feeling poorly.  Pt began feeling weak and felt like she was going to fall.  Started to collapse and sister caught her before she fell.  Denied chest pain.  No shortness of breath.  Pt states she is nauseated now and does not feel well.  No specific chest discomfort at this time.  No fevers recently.  No change in urination.

## 2015-03-19 NOTE — ED Provider Notes (Signed)
CSN: [id number]     Arrival date & time 03/19/15  1144 History   First MD Initiated Contact with Patient 03/19/15 1503     Chief Complaint  Patient presents with  . Near Syncope     (Consider location/radiation/quality/duration/timing/severity/associated sxs/prior Treatment) Patient is a 69 y.o. female presenting with near-syncope. The history is provided by the patient (pt states she has felt weak and had a near syncopal episode today).  Near Syncope This is a new problem. The current episode started 6 to 12 hours ago. The problem occurs rarely. The problem has been resolved. Pertinent negatives include no chest pain, no abdominal pain and no headaches. Nothing aggravates the symptoms. Nothing relieves the symptoms.    Past Medical History  Diagnosis Date  . Anxiety   . Depression   . Fibromyalgia     ESR, CRP, ANA and RF all wnl.  . Hypertension   . Hyperlipidemia    Past Surgical History  Procedure Laterality Date  . Abdominal hysterectomy  age 84  . Colonoscopy  07-12-12    per Dr. Hilarie Fredrickson, clear, repeat in 10 yrs   . Hand surgery     Family History  Problem Relation Age of Onset  . Alcohol abuse Father   . Kidney disease Father     ESRD  . Cardiomyopathy Father     alcohol induced CM  . Cancer Brother     head and neck (throat)  . Hypertension Other   . Colon cancer Neg Hx    Social History  Substance Use Topics  . Smoking status: Former Smoker    Types: Cigarettes    Quit date: 07/29/1963  . Smokeless tobacco: Never Used  . Alcohol Use: No   OB History    No data available     Review of Systems  Constitutional: Positive for fatigue. Negative for appetite change.  HENT: Negative for congestion, ear discharge and sinus pressure.   Eyes: Negative for discharge.  Respiratory: Negative for cough.   Cardiovascular: Positive for near-syncope. Negative for chest pain.  Gastrointestinal: Negative for abdominal pain and diarrhea.  Genitourinary: Negative for  frequency and hematuria.  Musculoskeletal: Negative for back pain.  Skin: Negative for rash.  Neurological: Negative for seizures and headaches.  Psychiatric/Behavioral: Negative for hallucinations.      Allergies  Ciprofloxacin; Codeine; Escitalopram oxalate; Hydrocodone; and Oxybutynin chloride  Home Medications   Prior to Admission medications   Medication Sig Start Date End Date Taking? Authorizing Provider  ALPRAZolam Duanne Moron) 0.5 MG tablet Take 1 tablet (0.5 mg total) by mouth 3 (three) times daily as needed for anxiety. 11/07/14  Yes Laurey Morale, MD  amLODipine (NORVASC) 5 MG tablet Take 1 tablet (5 mg total) by mouth daily. 03/12/15  Yes Laurey Morale, MD  aspirin EC 81 MG tablet Take 1 tablet (81 mg total) by mouth daily. 03/12/15  Yes Laurey Morale, MD  diazepam (VALIUM) 5 MG tablet Take 5 mg by mouth every 6 (six) hours as needed for anxiety.   Yes Historical Provider, MD  fish oil-omega-3 fatty acids 1000 MG capsule Take 2 g by mouth daily.   Yes Historical Provider, MD  hydrochlorothiazide (HYDRODIURIL) 25 MG tablet Take 1 tablet (25 mg total) by mouth daily. 11/07/14  Yes Laurey Morale, MD  Magnesium 400 MG CAPS Take 400 mg by mouth daily.    Yes Historical Provider, MD  Multiple Vitamin (MULTIVITAMIN) tablet Take 1 tablet by mouth daily.   Yes Historical  Provider, MD  predniSONE (DELTASONE) 10 MG tablet Take 1 tablet (10 mg total) by mouth 2 (two) times daily with a meal. 11/07/14  Yes Laurey Morale, MD  ranitidine (ZANTAC) 150 MG tablet Take 150 mg by mouth 2 (two) times daily as needed for heartburn.   Yes Historical Provider, MD  potassium chloride (KLOR-CON) 20 MEQ packet Take 20 mEq by mouth daily. Patient not taking: Reported on 03/19/2015 03/12/15   Laurey Morale, MD  sulfamethoxazole-trimethoprim (BACTRIM DS,SEPTRA DS) 800-160 MG per tablet Take 1 tablet by mouth 2 (two) times daily. 03/19/15 03/26/15  Milton Ferguson, MD   BP 178/89 mmHg  Pulse 88  Temp(Src) 97.4 F  (36.3 C) (Oral)  Resp 20  SpO2 98%  LMP 03/31/2004 Physical Exam  Constitutional: She is oriented to person, place, and time. She appears well-developed.  HENT:  Head: Normocephalic.  Eyes: Conjunctivae and EOM are normal. No scleral icterus.  Neck: Neck supple. No thyromegaly present.  Cardiovascular: Normal rate and regular rhythm.  Exam reveals no gallop and no friction rub.   No murmur heard. Pulmonary/Chest: No stridor. She has no wheezes. She has no rales. She exhibits no tenderness.  Abdominal: She exhibits no distension. There is no tenderness. There is no rebound.  Musculoskeletal: Normal range of motion. She exhibits no edema.  Lymphadenopathy:    She has no cervical adenopathy.  Neurological: She is oriented to person, place, and time. She exhibits normal muscle tone. Coordination normal.  Skin: No rash noted. No erythema.  Psychiatric: She has a normal mood and affect. Her behavior is normal.    ED Course  Procedures (including critical care time) Labs Review Labs Reviewed  BASIC METABOLIC PANEL - Abnormal; Notable for the following:    Potassium 3.2 (*)    All other components within normal limits  URINALYSIS, ROUTINE W REFLEX MICROSCOPIC (NOT AT [facility]) - Abnormal; Notable for the following:    Hgb urine dipstick MODERATE (*)    Leukocytes, UA MODERATE (*)    All other components within normal limits  URINE MICROSCOPIC-ADD ON - Abnormal; Notable for the following:    Squamous Epithelial / LPF FEW (*)    Bacteria, UA FEW (*)    All other components within normal limits  URINE CULTURE  CBC  CBG MONITORING, ED    Imaging Review No results found. I have personally reviewed and evaluated these images and lab results as part of my medical decision-making.   EKG Interpretation   Date/Time:  Monday March 19 2015 12:04:50 EDT Ventricular Rate:  78 PR Interval:  92 QRS Duration: 118 QT Interval:  399 QTC Calculation: 517 R Axis:   62 Text Interpretation:   Sinus rhythm Multiple premature complexes, vent   Short PR interval Nonspecific intraventricular conduction delay Borderline  repolarization abnormality Confirmed by ZAMMIT  MD, JOSEPH [phone]) on  03/19/2015 3:04:13 PM      MDM   Final diagnoses:  UTI (lower urinary tract infection)   Near syncope,  Unremarkable labs except urine c/w uti,  Doubt cardiac or tia,  Suspect uti causing fatique,  rx bactrim and pt to follow up with pcp    Milton Ferguson, MD 03/19/15 1739

## 2015-03-19 NOTE — Discharge Instructions (Signed)
Follow up with your md next week.  Drink plenty of fluids °

## 2015-03-21 LAB — URINE CULTURE: Special Requests: NORMAL

## 2015-04-09 ENCOUNTER — Telehealth: Payer: Self-pay | Admitting: Family Medicine

## 2015-04-09 DIAGNOSIS — E876 Hypokalemia: Secondary | ICD-10-CM

## 2015-04-09 NOTE — Telephone Encounter (Signed)
done

## 2015-04-10 ENCOUNTER — Other Ambulatory Visit (INDEPENDENT_AMBULATORY_CARE_PROVIDER_SITE_OTHER): Payer: Commercial Managed Care - HMO

## 2015-04-10 DIAGNOSIS — E876 Hypokalemia: Secondary | ICD-10-CM

## 2015-04-10 LAB — BASIC METABOLIC PANEL
BUN: 12 mg/dL (ref 6–23)
CO2: 30 mEq/L (ref 19–32)
Calcium: 8.7 mg/dL (ref 8.4–10.5)
Chloride: 104 mEq/L (ref 96–112)
Creatinine, Ser: 0.78 mg/dL (ref 0.40–1.20)
GFR: 94.24 mL/min (ref >60.00)
GLUCOSE: 83 mg/dL (ref 70–99)
Potassium: 3.7 mEq/L (ref 3.5–5.1)
SODIUM: 142 meq/L (ref 135–145)

## 2015-06-04 ENCOUNTER — Telehealth: Payer: Self-pay | Admitting: Family Medicine

## 2015-06-04 NOTE — Telephone Encounter (Signed)
Pt request refill ALPRAZolam (XANAX) 0.5 MG tablet [facility]  [place]

## 2015-06-05 ENCOUNTER — Other Ambulatory Visit: Payer: Self-pay | Admitting: Family Medicine

## 2015-06-05 NOTE — Telephone Encounter (Signed)
Call in #[phone] with 5 rf 

## 2015-06-06 MED ORDER — ALPRAZOLAM 0.5 MG PO TABS: 0.5000 mg | ORAL_TABLET | Freq: Three times a day (TID) | ORAL | Status: DC | PRN

## 2015-06-06 NOTE — Telephone Encounter (Signed)
I called in script 

## 2015-08-17 ENCOUNTER — Ambulatory Visit (INDEPENDENT_AMBULATORY_CARE_PROVIDER_SITE_OTHER): Payer: Commercial Managed Care - HMO | Admitting: Family Medicine

## 2015-08-17 ENCOUNTER — Encounter: Payer: Self-pay | Admitting: Family Medicine

## 2015-08-17 VITALS — BP 190/97 | HR 96 | Temp 98.9°F | Ht 69.0 in | Wt 138.0 lb

## 2015-08-17 DIAGNOSIS — N390 Urinary tract infection, site not specified: Secondary | ICD-10-CM | POA: Diagnosis not present

## 2015-08-17 DIAGNOSIS — I10 Essential (primary) hypertension: Secondary | ICD-10-CM

## 2015-08-17 DIAGNOSIS — R350 Frequency of micturition: Secondary | ICD-10-CM

## 2015-08-17 LAB — POCT URINALYSIS DIPSTICK
Bilirubin, UA: NEGATIVE
GLUCOSE UA: NEGATIVE
Ketones, UA: NEGATIVE
NITRITE UA: NEGATIVE
PH UA: 8.5
SPEC GRAV UA: 1.015
UROBILINOGEN UA: 1

## 2015-08-17 MED ORDER — SULFAMETHOXAZOLE-TRIMETHOPRIM 800-160 MG PO TABS: 1.0000 | ORAL_TABLET | Freq: Two times a day (BID) | ORAL | Status: DC

## 2015-08-17 MED ORDER — LISINOPRIL 20 MG PO TABS: 20.0000 mg | ORAL_TABLET | Freq: Every day | ORAL | Status: DC

## 2015-08-17 NOTE — Progress Notes (Signed)
Pre visit review using our clinic review tool, if applicable. No additional management support is needed unless otherwise documented below in the visit note. 

## 2015-08-20 ENCOUNTER — Encounter: Payer: Self-pay | Admitting: Family Medicine

## 2015-08-20 LAB — URINE CULTURE: Colony Count: >=100000

## 2015-08-20 NOTE — Progress Notes (Signed)
   Subjective:    Patient ID: [name], female    DOB: 1945/09/20, 70 y.o.   MRN: [id number]  HPI Here to discuss a probable UTI, but we also talked about her BP. This has been going up at home and has been high here in the office for the past 6 months or so. She complains of one week of urinary urgency and burning. No fever or nausea. She drinks lots of water. No chest pain or SOB or headache.   Review of Systems  Constitutional: Negative.   HENT: Negative.   Eyes: Negative.   Respiratory: Negative.   Cardiovascular: Negative.   Genitourinary: Positive for dysuria, urgency and frequency. Negative for hematuria, flank pain and pelvic pain.  Neurological: Negative.        Objective:   Physical Exam  Constitutional: She is oriented to person, place, and time. She appears well-developed and well-nourished.  Neck: No thyromegaly present.  Cardiovascular: Normal rate, regular rhythm, normal heart sounds and intact distal pulses.   Pulmonary/Chest: Effort normal and breath sounds normal.  Abdominal: Soft. Bowel sounds are normal. She exhibits no distension and no mass. There is no tenderness. There is no rebound and no guarding.  Musculoskeletal: She exhibits no edema.  Lymphadenopathy:    She has no cervical adenopathy.  Neurological: She is alert and oriented to person, place, and time.          Assessment & Plan:  For the UTI we will treat with Bactrim DS and culture the sample. For the HTN we will add Lisinopril 20 mg daily to her regimen. Recheck in 3 weeks

## 2015-08-24 MED ORDER — NITROFURANTOIN MONOHYD MACRO 100 MG PO CAPS
100.0000 mg | ORAL_CAPSULE | Freq: Two times a day (BID) | ORAL | Status: DC
Start: 2015-08-24 — End: 2015-10-30

## 2015-08-24 NOTE — Addendum Note (Signed)
Addended by: Aggie Hacker A on: 08/24/2015 12:17 PM   Modules accepted: Orders

## 2015-09-25 ENCOUNTER — Encounter (HOSPITAL_COMMUNITY): Payer: Self-pay

## 2015-09-25 ENCOUNTER — Emergency Department (HOSPITAL_COMMUNITY)
Admission: EM | Admit: 2015-09-25 | Discharge: 2015-09-25 | Disposition: A | Discharge status: Home / Self Care | Payer: Medicare Other | Attending: Emergency Medicine | Admitting: Emergency Medicine

## 2015-09-25 DIAGNOSIS — F329 Major depressive disorder, single episode, unspecified: Secondary | ICD-10-CM | POA: Insufficient documentation

## 2015-09-25 DIAGNOSIS — Z79899 Other long term (current) drug therapy: Secondary | ICD-10-CM | POA: Diagnosis not present

## 2015-09-25 DIAGNOSIS — Z87891 Personal history of nicotine dependence: Secondary | ICD-10-CM | POA: Diagnosis not present

## 2015-09-25 DIAGNOSIS — R200 Anesthesia of skin: Secondary | ICD-10-CM | POA: Diagnosis present

## 2015-09-25 DIAGNOSIS — Z7952 Long term (current) use of systemic steroids: Secondary | ICD-10-CM | POA: Insufficient documentation

## 2015-09-25 DIAGNOSIS — I10 Essential (primary) hypertension: Secondary | ICD-10-CM | POA: Insufficient documentation

## 2015-09-25 DIAGNOSIS — R202 Paresthesia of skin: Secondary | ICD-10-CM

## 2015-09-25 DIAGNOSIS — F419 Anxiety disorder, unspecified: Secondary | ICD-10-CM | POA: Diagnosis not present

## 2015-09-25 DIAGNOSIS — Z7982 Long term (current) use of aspirin: Secondary | ICD-10-CM | POA: Insufficient documentation

## 2015-09-25 DIAGNOSIS — E785 Hyperlipidemia, unspecified: Secondary | ICD-10-CM | POA: Insufficient documentation

## 2015-09-25 LAB — URINALYSIS, ROUTINE W REFLEX MICROSCOPIC
Bilirubin Urine: NEGATIVE
GLUCOSE, UA: NEGATIVE mg/dL
KETONES UR: NEGATIVE mg/dL
Nitrite: NEGATIVE
PH: 7 (ref 5.0–8.0)
PROTEIN: NEGATIVE mg/dL
Specific Gravity, Urine: 1.009 (ref 1.005–1.030)

## 2015-09-25 LAB — COMPREHENSIVE METABOLIC PANEL
ALBUMIN: 3.9 g/dL (ref 3.5–5.0)
ALK PHOS: 53 U/L (ref 38–126)
ALT: 20 U/L (ref 14–54)
AST: 17 U/L (ref 15–41)
Anion gap: 9 (ref 5–15)
BILIRUBIN TOTAL: 0.6 mg/dL (ref 0.3–1.2)
BUN: 8 mg/dL (ref 6–20)
CALCIUM: 8.8 mg/dL — AB (ref 8.9–10.3)
CO2: 27 mmol/L (ref 22–32)
CREATININE: 0.81 mg/dL (ref 0.44–1.00)
Chloride: 105 mmol/L (ref 101–111)
GFR calc Af Amer: >60 mL/min (ref >60)
GFR calc non Af Amer: >60 mL/min (ref >60)
GLUCOSE: 107 mg/dL — AB (ref 65–99)
Potassium: 3.4 mmol/L — ABNORMAL LOW (ref 3.5–5.1)
Sodium: 141 mmol/L (ref 135–145)
TOTAL PROTEIN: 6.3 g/dL — AB (ref 6.5–8.1)

## 2015-09-25 LAB — CBC WITH DIFFERENTIAL/PLATELET
BASOS PCT: 0 %
Basophils Absolute: 0 10*3/uL (ref 0.0–0.1)
EOS PCT: 0 %
Eosinophils Absolute: 0 10*3/uL (ref 0.0–0.7)
HEMATOCRIT: 38.2 % (ref 36.0–46.0)
HEMOGLOBIN: 12.7 g/dL (ref 12.0–15.0)
Lymphocytes Relative: 9 %
Lymphs Abs: 0.7 10*3/uL (ref 0.7–4.0)
MCH: 31.4 pg (ref 26.0–34.0)
MCHC: 33.2 g/dL (ref 30.0–36.0)
MCV: 94.6 fL (ref 78.0–100.0)
MONO ABS: 0.5 10*3/uL (ref 0.1–1.0)
MONOS PCT: 6 %
NEUTROS ABS: 6.8 10*3/uL (ref 1.7–7.7)
Neutrophils Relative %: 85 %
Platelets: 167 10*3/uL (ref 150–400)
RBC: 4.04 MIL/uL (ref 3.87–5.11)
RDW: 12.7 % (ref 11.5–15.5)
WBC: 8 10*3/uL (ref 4.0–10.5)

## 2015-09-25 LAB — URINE MICROSCOPIC-ADD ON: Bacteria, UA: NONE SEEN

## 2015-09-25 LAB — TSH: TSH: 0.312 u[IU]/mL — ABNORMAL LOW (ref 0.350–4.500)

## 2015-09-25 NOTE — ED Notes (Signed)
Patient was given water and will attempt urine sample again after.

## 2015-09-25 NOTE — ED Notes (Signed)
Patient attempted to provide a urine sample but was unable to.  Patient was given more water to drink.

## 2015-09-25 NOTE — ED Notes (Signed)
Pt complains of numbness in both shoulders that radiate to her hands for several weeks, she has to wear gloves all the time because her hands stay cold because of her circulation

## 2015-09-25 NOTE — ED Notes (Signed)
Patient was unable to urinate.

## 2015-09-25 NOTE — ED Provider Notes (Signed)
CSN: [id number]     Arrival date & time 09/25/15  2725 History   First MD Initiated Contact with Patient 09/25/15 [phone]     Chief Complaint  Patient presents with  . Numbness     (Consider location/radiation/quality/duration/timing/severity/associated sxs/prior Treatment) HPI...Marland KitchenMarland KitchenPatient complains of numbness in her hands for 2 months described as burning and tightness. Review systems positive for some left neck discomfort radiating to the shoulder. She is ambulatory with a cane. No frank neurological deficits, fever, sweats, chills, chest pain, dyspnea. Severity of symptoms is mild to moderate.  Past Medical History  Diagnosis Date  . Anxiety   . Depression   . Fibromyalgia     ESR, CRP, ANA and RF all wnl.  . Hypertension   . Hyperlipidemia    Past Surgical History  Procedure Laterality Date  . Abdominal hysterectomy  age 34  . Colonoscopy  07-12-12    per Dr. Hilarie Fredrickson, clear, repeat in 10 yrs   . Hand surgery     Family History  Problem Relation Age of Onset  . Alcohol abuse Father   . Kidney disease Father     ESRD  . Cardiomyopathy Father     alcohol induced CM  . Cancer Brother     head and neck (throat)  . Hypertension Other   . Colon cancer Neg Hx    Social History  Substance Use Topics  . Smoking status: Former Smoker    Types: Cigarettes    Quit date: 07/29/1963  . Smokeless tobacco: Never Used  . Alcohol Use: No   OB History    No data available     Review of Systems    Allergies  Ciprofloxacin; Codeine; Escitalopram oxalate; Hydrocodone; and Oxybutynin chloride  Home Medications   Prior to Admission medications   Medication Sig Start Date End Date Taking? Authorizing Provider  ALPRAZolam Duanne Moron) 0.5 MG tablet Take 1 tablet (0.5 mg total) by mouth 3 (three) times daily as needed for anxiety. Patient taking differently: Take 0.5 mg by mouth 2 (two) times daily.  06/06/15  Yes Laurey Morale, MD  aspirin EC 81 MG tablet Take 1 tablet (81 mg total)  by mouth daily. 03/12/15  Yes Laurey Morale, MD  Cyanocobalamin (VITAMIN B-12 PO) Take 1,200 mg by mouth daily.   Yes Historical Provider, MD  fish oil-omega-3 fatty acids 1000 MG capsule Take 1 g by mouth daily.    Yes Historical Provider, MD  hydrochlorothiazide (HYDRODIURIL) 25 MG tablet Take 1 tablet (25 mg total) by mouth daily. 11/07/14  Yes Laurey Morale, MD  lisinopril (PRINIVIL,ZESTRIL) 20 MG tablet Take 1 tablet (20 mg total) by mouth daily. 08/17/15  Yes Laurey Morale, MD  magnesium oxide (MAG-OX) 400 MG tablet Take 400 mg by mouth daily.   Yes Historical Provider, MD  predniSONE (DELTASONE) 10 MG tablet Take 1 tablet (10 mg total) by mouth 2 (two) times daily with a meal. 11/07/14  Yes Laurey Morale, MD  ranitidine (ZANTAC) 150 MG tablet Take 150 mg by mouth daily.    Yes Historical Provider, MD  nitrofurantoin, macrocrystal-monohydrate, (MACROBID) 100 MG capsule Take 1 capsule (100 mg total) by mouth 2 (two) times daily. Patient not taking: Reported on 09/25/2015 08/24/15   Laurey Morale, MD  sulfamethoxazole-trimethoprim (BACTRIM DS,SEPTRA DS) 800-160 MG tablet Take 1 tablet by mouth 2 (two) times daily. Patient not taking: Reported on 09/25/2015 08/17/15   Laurey Morale, MD   BP 170/109 mmHg  Pulse  71  Temp(Src) 97.8 F (36.6 C) (Oral)  Resp 18  SpO2 97%  LMP 03/31/2004 Physical Exam  Constitutional: She is oriented to person, place, and time. She appears well-developed and well-nourished.  Looks well, ambulatory with a cane.  HENT:  Head: Normocephalic and atraumatic.  Eyes: Conjunctivae and EOM are normal. Pupils are equal, round, and reactive to light.  Neck: Normal range of motion. Neck supple.  Cardiovascular: Normal rate and regular rhythm.   Pulmonary/Chest: Effort normal and breath sounds normal.  Abdominal: Soft. Bowel sounds are normal.  Musculoskeletal: Normal range of motion.  Full range of motion of bilateral upper extremity with no obvious neurological deficits.   Neurological: She is alert and oriented to person, place, and time.  Skin: Skin is warm and dry.  Psychiatric: She has a normal mood and affect. Her behavior is normal.  Nursing note and vitals reviewed.   ED Course  Procedures (including critical care time) Labs Review Labs Reviewed  COMPREHENSIVE METABOLIC PANEL - Abnormal; Notable for the following:    Potassium 3.4 (*)    Glucose, Bld 107 (*)    Calcium 8.8 (*)    Total Protein 6.3 (*)    All other components within normal limits  URINALYSIS, ROUTINE W REFLEX MICROSCOPIC (NOT AT [facility]) - Abnormal; Notable for the following:    Hgb urine dipstick MODERATE (*)    Leukocytes, UA MODERATE (*)    All other components within normal limits  TSH - Abnormal; Notable for the following:    TSH 0.312 (*)    All other components within normal limits  URINE MICROSCOPIC-ADD ON - Abnormal; Notable for the following:    Squamous Epithelial / LPF 0-5 (*)    All other components within normal limits  CBC WITH DIFFERENTIAL/PLATELET    Imaging Review No results found. I have personally reviewed and evaluated these images and lab results as part of my medical decision-making.   EKG Interpretation None      MDM   Final diagnoses:  Paresthesia of both hands    Uncertain etiology of patient's hand numbness. TSH slightly low. She has primary care follow-up.    Nat Christen, MD 09/25/15 [phone]

## 2015-09-25 NOTE — Discharge Instructions (Signed)
Tests showed no life-threatening condition.  Follow-up your primary care doctor. °

## 2015-09-25 NOTE — ED Notes (Signed)
MD at bedside. 

## 2015-10-30 ENCOUNTER — Encounter: Payer: Self-pay | Admitting: Family Medicine

## 2015-10-30 ENCOUNTER — Ambulatory Visit (INDEPENDENT_AMBULATORY_CARE_PROVIDER_SITE_OTHER): Payer: Medicare Other | Admitting: Family Medicine

## 2015-10-30 VITALS — BP 167/96 | HR 99 | Temp 98.8°F | Ht 69.0 in | Wt 128.0 lb

## 2015-10-30 DIAGNOSIS — R739 Hyperglycemia, unspecified: Secondary | ICD-10-CM | POA: Diagnosis not present

## 2015-10-30 DIAGNOSIS — G629 Polyneuropathy, unspecified: Secondary | ICD-10-CM

## 2015-10-30 DIAGNOSIS — E538 Deficiency of other specified B group vitamins: Secondary | ICD-10-CM

## 2015-10-30 LAB — HEMOGLOBIN A1C: HEMOGLOBIN A1C: 6.2 % (ref 4.6–6.5)

## 2015-10-30 LAB — VITAMIN B12: VITAMIN B 12: 268 pg/mL (ref 211–911)

## 2015-10-30 MED ORDER — GABAPENTIN 100 MG PO CAPS: 100.0000 mg | ORAL_CAPSULE | Freq: Three times a day (TID) | ORAL | Status: DC

## 2015-10-30 NOTE — Progress Notes (Signed)
   Subjective:    Patient ID: [name], female    DOB: Feb 06, 1946, 70 y.o.   MRN: [id number]  HPI Here for the rapid onset over the past 8 weeks of weakness, numbness, and tingling in both hands and bot feet. No blurred vision, no speech changes. She went to the ER in February and had a normal workup. She has to use a cane or walker to walk now.    Review of Systems  Respiratory: Negative.   Cardiovascular: Negative.   Endocrine: Negative.   Neurological: Positive for weakness and numbness. Negative for dizziness, tremors, seizures, syncope, facial asymmetry, speech difficulty, light-headedness and headaches.       Objective:   Physical Exam  Constitutional: She is oriented to person, place, and time.  Alert, weak, requires assistance to walk  Neck: No thyromegaly present.  Cardiovascular: Normal rate, regular rhythm, normal heart sounds and intact distal pulses.   Pulmonary/Chest: Effort normal and breath sounds normal.  Lymphadenopathy:    She has no cervical adenopathy.  Neurological: She is alert and oriented to person, place, and time. No cranial nerve deficit.  Her legs appear weak and she requires help to stand up from a sitting position           Assessment & Plan:  Recent onset of peripheral neuropathy. Check an A1c and a B12 level today. Start on Gabapentin 100 mg tid. Refer to Neurology to evaluate further  Laurey Morale, MD

## 2015-10-30 NOTE — Progress Notes (Signed)
Pre visit review using our clinic review tool, if applicable. No additional management support is needed unless otherwise documented below in the visit note. 

## 2015-11-22 ENCOUNTER — Ambulatory Visit (INDEPENDENT_AMBULATORY_CARE_PROVIDER_SITE_OTHER): Payer: Medicare Other | Admitting: Neurology

## 2015-11-22 ENCOUNTER — Other Ambulatory Visit: Payer: Self-pay | Admitting: Neurology

## 2015-11-22 ENCOUNTER — Other Ambulatory Visit (INDEPENDENT_AMBULATORY_CARE_PROVIDER_SITE_OTHER): Payer: Medicare Other

## 2015-11-22 ENCOUNTER — Ambulatory Visit
Admission: RE | Admit: 2015-11-22 | Discharge: 2015-11-22 | Disposition: A | Discharge status: Home / Self Care | Payer: Medicare Other | Source: Ambulatory Visit | Attending: Neurology | Admitting: Neurology

## 2015-11-22 ENCOUNTER — Encounter: Payer: Self-pay | Admitting: Neurology

## 2015-11-22 VITALS — BP 176/88 | HR 88 | Ht 69.0 in | Wt 127.2 lb

## 2015-11-22 DIAGNOSIS — R292 Abnormal reflex: Secondary | ICD-10-CM

## 2015-11-22 DIAGNOSIS — R29898 Other symptoms and signs involving the musculoskeletal system: Secondary | ICD-10-CM

## 2015-11-22 DIAGNOSIS — M6289 Other specified disorders of muscle: Secondary | ICD-10-CM

## 2015-11-22 DIAGNOSIS — R261 Paralytic gait: Secondary | ICD-10-CM

## 2015-11-22 DIAGNOSIS — G959 Disease of spinal cord, unspecified: Secondary | ICD-10-CM

## 2015-11-22 DIAGNOSIS — G8220 Paraplegia, unspecified: Secondary | ICD-10-CM

## 2015-11-22 DIAGNOSIS — IMO0002 Reserved for concepts with insufficient information to code with codable children: Secondary | ICD-10-CM

## 2015-11-22 DIAGNOSIS — R29818 Other symptoms and signs involving the nervous system: Secondary | ICD-10-CM

## 2015-11-22 LAB — FOLATE: FOLATE: 16.3 ng/mL (ref >5.9)

## 2015-11-22 LAB — CREATININE, SERUM: Creatinine, Ser: 0.82 mg/dL (ref 0.40–1.20)

## 2015-11-22 MED ORDER — TIZANIDINE HCL 2 MG PO CAPS: 2.0000 mg | ORAL_CAPSULE | Freq: Every evening | ORAL | Status: DC | PRN

## 2015-11-22 NOTE — Progress Notes (Signed)
[facility] Clinic Note - Initial Visit   Date: 11/22/2015  [name] MRN: [id number] DOB: [birth year]   Dear Dr. Sarajane Jews:  Thank you for your kind referral of [name] for consultation of generalized paresthesias. Although her history is well known to you, please allow [place] to reiterate it for the purpose of our medical record. The patient was accompanied to the clinic by sister who also provides collateral information.     History of Present Illness: [name] is a 70 y.o. right-handed [place] American female with depression, hypertension, hyperlipidema, fibromyalgia,  presenting for evaluation of generalized paresthesias and weakness.  Patient is a poor historian.    Ever since 1993, she has experiencing pain and numbness involving the entire body. For a long time, she attributed symptoms to fibromyalgia. She complains of constant numbness of her hands, weakness of the hands and legs, and gait difficulty.  Symptoms are constant and she has not identified anything that exacerbates symptoms.  She wears gloves because warmth helps her hands.  In 2015, she moved in with her sister because of greater difficulty completing her ADLs. She is able to bath herself and feed herself, but cannot prepare meals and requires assistance with fine motor movements.  She has been walking with a cane for a number of years and started using a walker several months ago. She fell once last month and did not sustain any injuries.  She feels that over the past 6 months, everything has become progressively worse.    Out-side paper records, electronic medical record, and images have been reviewed where available and summarized as:  Lab Results  Component Value Date   TSH 0.312* 09/25/2015   Lab Results  Component Value Date   VITAMINB12 268 10/30/2015   Lab Results  Component Value Date   HGBA1C 6.2 10/30/2015   Lab Results  Component Value  Date   ESRSEDRATE 30* 03/10/2011   MRI lumbar spine wwo contrast 03/19/2004: Congenitally narrowed spinal canal. Mild levoscoliosis. Superimposed degenerative changes with multifactorial spinal stenosis and lateral recess stenosis most notable L4-5 level followed by the L3-4 level as described above. Scattered bony degenerative changes as noted above. Most indeterminant bony lesion is noted involving the mid to lower sacrum.   Past Medical History  Diagnosis Date  . Anxiety   . Depression   . Fibromyalgia     ESR, CRP, ANA and RF all wnl.  . Hypertension   . Hyperlipidemia     Past Surgical History  Procedure Laterality Date  . Abdominal hysterectomy  age 31  . Colonoscopy  07-12-12    per Dr. Hilarie Fredrickson, clear, repeat in 10 yrs   . Hand surgery       Medications:  Outpatient Encounter Prescriptions as of 11/22/2015  Medication Sig Note  . ALPRAZolam (XANAX) 0.5 MG tablet Take 1 tablet (0.5 mg total) by mouth 3 (three) times daily as needed for anxiety. (Patient taking differently: Take 0.5 mg by mouth 2 (two) times daily. )   . aspirin EC 81 MG tablet Take 1 tablet (81 mg total) by mouth daily.   . Cyanocobalamin (VITAMIN B-12 PO) Take 1,200 mg by mouth daily.   . fish oil-omega-3 fatty acids 1000 MG capsule Take 1 g by mouth daily.    Marland Kitchen gabapentin (NEURONTIN) 100 MG capsule Take 1 capsule (100 mg total) by mouth 3 (three) times daily.   . hydrochlorothiazide (HYDRODIURIL) 25 MG tablet Take 1 tablet (25 mg total)  by mouth daily.   Marland Kitchen lisinopril (PRINIVIL,ZESTRIL) 20 MG tablet Take 1 tablet (20 mg total) by mouth daily.   . magnesium oxide (MAG-OX) 400 MG tablet Take 400 mg by mouth daily.   . predniSONE (DELTASONE) 10 MG tablet Take 1 tablet (10 mg total) by mouth 2 (two) times daily with a meal. 09/25/2015: .  . ranitidine (ZANTAC) 150 MG tablet Take 150 mg by mouth daily.    . tizanidine (ZANAFLEX) 2 MG capsule Take 1 capsule (2 mg total) by mouth at bedtime as needed for muscle  spasms.    No facility-administered encounter medications on file as of 11/22/2015.     Allergies:  Allergies  Allergen Reactions  . Ciprofloxacin Nausea And Vomiting  . Codeine Nausea And Vomiting and Other (See Comments)    "head explode"   . Escitalopram Oxalate Nausea And Vomiting  . Hydrocodone Nausea And Vomiting  . Oxybutynin Chloride Nausea And Vomiting    Family History: Family History  Problem Relation Age of Onset  . Alcohol abuse Father   . Kidney disease Father     ESRD  . Cardiomyopathy Father     alcohol induced CM  . Cancer Brother     head and neck (throat)  . Hypertension Other   . Colon cancer Neg Hx   . Rheum arthritis Sister     Social History: Social History  Substance Use Topics  . Smoking status: Former Smoker    Types: Cigarettes    Quit date: 07/29/1963  . Smokeless tobacco: Never Used  . Alcohol Use: No   Social History   Social History Narrative   Lives with sister in a one story home.  Has no children.     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     Education: 7th grade.    Review of Systems:  CONSTITUTIONAL: No fevers, chills, night sweats, or weight loss.   EYES: No visual changes or eye pain ENT: No hearing changes.  No history of nose bleeds.   RESPIRATORY: No cough, wheezing and shortness of breath.   CARDIOVASCULAR: Negative for chest pain, and palpitations.   GI: Negative for abdominal discomfort, blood in stools or black stools.  No recent change in bowel habits.   GU:  No history of incontinence.   MUSCLOSKELETAL: +history of joint pain or swelling.  No myalgias.   SKIN: Negative for lesions, rash, and itching.   HEMATOLOGY/ONCOLOGY: Negative for prolonged bleeding, bruising easily, and swollen nodes.  No history of cancer.   ENDOCRINE: Negative for cold or heat intolerance, polydipsia or goiter.   PSYCH:  +depression or anxiety symptoms.   NEURO: As Above.   Vital Signs:  BP 176/88 mmHg   Pulse 88  Ht 5' 9" (1.753 m)  Wt 127 lb 3 oz (57.692 kg)  BMI 18.77 kg/m2  SpO2 97%  LMP 03/31/2004   General Medical Exam:   General:  Well appearing, comfortable.   Eyes/ENT: see cranial nerve examination.   Neck: No masses appreciated.  Full range of motion without tenderness.  No carotid bruits. Respiratory:  Clear to auscultation, good air entry bilaterally.   Cardiac:  Regular rate and rhythm, no murmur.   Extremities:  Right 5th digit Dupytren's deformity.    Skin:  No rashes or lesions.  Neurological Exam: MENTAL STATUS including orientation to time, place, person, recent and remote memory, attention span and concentration, language, and fund of knowledge is normal.  Speech is  not dysarthric.  CRANIAL NERVES: II:  No visual field defects.  Unremarkable fundi.   III-IV-VI: Pupils equal round and reactive to light.  Normal conjugate, extra-ocular eye movements in all directions of gaze.  No nystagmus.  No ptosis.   V:  Normal facial sensation.  Jaw jerk is absent. VII:  Normal facial symmetry and movements.  No pathological facial reflexes.  VIII:  Normal hearing and vestibular function.   IX-X:  Normal palatal movement.   XI:  Normal shoulder shrug and head rotation.   XII:  Normal tongue strength and range of motion, no deviation or fasciculation.  MOTOR:  Moderate lower extremity and forearm atrophy.  No fasciculations or abnormal movements.  No pronator drift.  Tone is increased on the left upper and lower extremities.    Right Upper Extremity:    Left Upper Extremity:    Deltoid  5-/5   Deltoid  5-/5   Biceps  5-/5   Biceps  5-/5   Triceps  5-/5   Triceps  5-/5   Wrist extensors  5-/5   Wrist extensors  4/5   Wrist flexors  5-/5   Wrist flexors  4/5   Finger extensors  4+/5   Finger extensors  4/5   Finger flexors  4+/5   Finger flexors  4/5   Dorsal interossei  4/5   Dorsal interossei  4/5   Abductor pollicis  4/5   Abductor pollicis  4/5   Tone (Ashworth scale)   0  Tone (Ashworth scale)  1   Right Lower Extremity:    Left Lower Extremity:    Hip flexors  4/5   Hip flexors  2/5   Hip extensors  5/5   Hip extensors  4/5   Knee flexors  5/5   Knee flexors  4/5   Knee extensors  5/5   Knee extensors  4/5   Dorsiflexors  5/5   Dorsiflexors  4/5   Plantarflexors  5/5   Plantarflexors  3/5   Toe extensors  5/5   Toe extensors  2/5   Toe flexors  5/5   Toe flexors  2/5   Tone (Ashworth scale)  0  Tone (Ashworth scale)  1   MSRs:  Right                                                                 Left brachioradialis 2+  brachioradialis 3+  biceps 2+  biceps 3+  triceps 2+  triceps 3+  patellar 3+  patellar 3+  ankle jerk 2+  ankle jerk 2+  Hoffman no  Hoffman no  plantar response down  plantar response down   SENSORY:  Sensation to all modalities is reduced in a gradient fashion distal to mid-forearm and in the lower legs, worse on the left.   COORDINATION/GAIT: Normal finger-to- nose-finger.  Slowed finger tapping bilaterally.  Unable to rise from a chair without using arms.  Gait is assisted with walker appears very unsteady, spastic, and slow.  IMPRESSION: Ms. Santini is a 70 year-old female referred for evaluation of bilateral hand paresthesias and weakness.  Her exam is most concerning for cervical myelopathy, so MRI cervical spine wwo contrast will be obtained.  She has asymmetrical weakness, much worse on the  left, with spastic features.  It was discussed that if her imaging shows structural lesion, she will need to be evaluated by neurosurgery.  For spasticity, I will start her on tizanidine 78m at bedtime and titrate as needed.   Her vitamin B12 is low-normal and although I doubt this is causing her symptoms, it may certainly contribute.  I will check MMA, folate, and vitamin B1 and have her start vitamin B12 10029m daily.     The duration of this appointment visit was 50 minutes of face-to-face time with the patient.  Greater  than 50% of this time was spent in counseling, explanation of diagnosis, planning of further management, and coordination of care.   Thank you for allowing me to participate in patient's care.  If I can answer any additional questions, I would be pleased to do so.    Sincerely,    Donika K. PaPosey ProntoDO

## 2015-11-22 NOTE — Patient Instructions (Addendum)
1.  MRI cervical spine without contrast 2.  Check blood work 3.  Start tizanidine 2mg  at bedtime as needed for cramps 4.  Start vitamin B12 1046mcg daily  We will call you with the results of the testing and decide the next step

## 2015-11-23 ENCOUNTER — Telehealth: Payer: Self-pay | Admitting: Neurology

## 2015-11-23 NOTE — Telephone Encounter (Signed)
Referral sent.  I have attempted to contact patient and her sister.  No answer.

## 2015-11-23 NOTE — Telephone Encounter (Signed)
I attempted to contact patient via phone today regarding the results of MRI cervical spine, however there was no answer so a message was left for the patient to return my call.   MRI cervical spine shows severe spinal stenosis at C3-4, C4-5, and C5-6 with cord compression and cord hyperintensity.  She needs urgent neurosurgery referral.   Bahja Bence K. Posey Pronto, DO

## 2015-11-26 LAB — METHYLMALONIC ACID, SERUM: METHYLMALONIC ACID, QUANT: 135 nmol/L (ref 87–318)

## 2015-11-26 LAB — VITAMIN B1: Vitamin B1 (Thiamine): 11 nmol/L (ref 8–30)

## 2015-11-30 ENCOUNTER — Other Ambulatory Visit: Payer: Self-pay | Admitting: Neurosurgery

## 2015-12-03 ENCOUNTER — Other Ambulatory Visit: Payer: Self-pay | Admitting: Neurosurgery

## 2015-12-10 ENCOUNTER — Encounter (HOSPITAL_COMMUNITY): Payer: Self-pay

## 2015-12-10 NOTE — Pre-Procedure Instructions (Signed)
ARIENNA KEALY  12/10/2015      [id number] - [place], [place] [address] Phone: [phone] Fax: [phone]    Your procedure is scheduled on Tues, May 23 @ 9:15 AM  Report to [facility] Admitting at 6:30 AM  Call this number if you have problems the morning of surgery:  [phone]   Remember:  Do not eat food or drink liquids after midnight.  Take these medicines the morning of surgery with A SIP OF WATER Alprazolam(Xanax),Prednisone(Deltasone),and Zantac(Ranitidine)               Stop taking your Aspirin,Fish Oil,Ibuprofen along with any Vitamins or Herbal Medications. No Goody's,BC's,Aleve,Advil, or Motrin   Do not wear jewelry, make-up or nail polish.  Do not wear lotions, powders, or perfumes.    Do not shave 48 hours prior to surgery.    Do not bring valuables to the hospital.  [facility] is not responsible for any belongings or valuables.  Contacts, dentures or bridgework may not be worn into surgery.  Leave your suitcase in the car.  After surgery it may be brought to your room.  For patients admitted to the hospital, discharge time will be determined by your treatment team.  Patients discharged the day of surgery will not be allowed to drive home.    Special instructions:  [facility] - Preparing for Surgery  Before surgery, you can play an important role.  Because skin is not sterile, your skin needs to be as free of germs as possible.  You can reduce the number of germs on you skin by washing with CHG (chlorahexidine gluconate) soap before surgery.  CHG is an antiseptic cleaner which kills germs and bonds with the skin to continue killing germs even after washing.  Please DO NOT use if you have an allergy to CHG or antibacterial soaps.  If your skin becomes reddened/irritated stop using the CHG and inform your nurse when you arrive at Short Stay.  Do not  shave (including legs and underarms) for at least 48 hours prior to the first CHG shower.  You may shave your face.  Please follow these instructions carefully:   1.  Shower with CHG Soap the night before surgery and the                                morning of Surgery.  2.  If you choose to wash your hair, wash your hair first as usual with your       normal shampoo.  3.  After you shampoo, rinse your hair and body thoroughly to remove the                      Shampoo.  4.  Use CHG as you would any other liquid soap.  You can apply chg directly       to the skin and wash gently with scrungie or a clean washcloth.  5.  Apply the CHG Soap to your body ONLY FROM THE NECK DOWN.        Do not use on open wounds or open sores.  Avoid contact with your eyes,       ears, mouth and genitals (private parts).  Wash genitals (private parts)       with your normal soap.  6.  Wash thoroughly, paying special attention to the area where your surgery        will be performed.  7.  Thoroughly rinse your body with warm water from the neck down.  8.  DO NOT shower/wash with your normal soap after using and rinsing off       the CHG Soap.  9.  Pat yourself dry with a clean towel.            10.  Wear clean pajamas.            11.  Place clean sheets on your bed the night of your first shower and do not        sleep with pets.  Day of Surgery  Do not apply any lotions/deoderants the morning of surgery.  Please wear clean clothes to the hospital/surgery center.    Please read over the following fact sheets that you were given. Pain Booklet, Coughing and Deep Breathing, MRSA Information and Surgical Site Infection Prevention

## 2015-12-11 ENCOUNTER — Encounter (HOSPITAL_COMMUNITY): Payer: Self-pay

## 2015-12-11 ENCOUNTER — Encounter (HOSPITAL_COMMUNITY)
Admission: RE | Admit: 2015-12-11 | Discharge: 2015-12-11 | Disposition: A | Discharge status: Home / Self Care | Payer: Medicare Other | Source: Ambulatory Visit | Attending: Neurosurgery | Admitting: Neurosurgery

## 2015-12-11 DIAGNOSIS — M4712 Other spondylosis with myelopathy, cervical region: Secondary | ICD-10-CM | POA: Insufficient documentation

## 2015-12-11 DIAGNOSIS — Z01812 Encounter for preprocedural laboratory examination: Secondary | ICD-10-CM | POA: Diagnosis present

## 2015-12-11 HISTORY — DX: Other muscle spasm: M62.838

## 2015-12-11 HISTORY — DX: Gastro-esophageal reflux disease without esophagitis: K21.9

## 2015-12-11 HISTORY — DX: Cervicalgia: M54.2

## 2015-12-11 HISTORY — DX: Other chronic pain: G89.29

## 2015-12-11 HISTORY — DX: Dorsalgia, unspecified: M54.9

## 2015-12-11 HISTORY — DX: Unspecified osteoarthritis, unspecified site: M19.90

## 2015-12-11 HISTORY — DX: Urgency of urination: R39.15

## 2015-12-11 HISTORY — DX: Pain in unspecified joint: M25.50

## 2015-12-11 HISTORY — DX: Unspecified cataract: H26.9

## 2015-12-11 HISTORY — DX: Weakness: R53.1

## 2015-12-11 HISTORY — DX: Other seasonal allergic rhinitis: J30.2

## 2015-12-11 HISTORY — DX: Pneumonia, unspecified organism: J18.9

## 2015-12-11 HISTORY — DX: Frequency of micturition: R35.0

## 2015-12-11 HISTORY — DX: Nocturia: R35.1

## 2015-12-11 LAB — CBC
HEMATOCRIT: 39.1 % (ref 36.0–46.0)
Hemoglobin: 13 g/dL (ref 12.0–15.0)
MCH: 31.3 pg (ref 26.0–34.0)
MCHC: 33.2 g/dL (ref 30.0–36.0)
MCV: 94.2 fL (ref 78.0–100.0)
PLATELETS: 154 10*3/uL (ref 150–400)
RBC: 4.15 MIL/uL (ref 3.87–5.11)
RDW: 12.5 % (ref 11.5–15.5)
WBC: 10.3 10*3/uL (ref 4.0–10.5)

## 2015-12-11 LAB — BASIC METABOLIC PANEL
Anion gap: 13 (ref 5–15)
BUN: 8 mg/dL (ref 6–20)
CHLORIDE: 101 mmol/L (ref 101–111)
CO2: 26 mmol/L (ref 22–32)
CREATININE: 0.82 mg/dL (ref 0.44–1.00)
Calcium: 9.2 mg/dL (ref 8.9–10.3)
Glucose, Bld: 98 mg/dL (ref 65–99)
POTASSIUM: 3.3 mmol/L — AB (ref 3.5–5.1)
SODIUM: 140 mmol/L (ref 135–145)

## 2015-12-11 LAB — SURGICAL PCR SCREEN
MRSA, PCR: NEGATIVE
STAPHYLOCOCCUS AUREUS: NEGATIVE

## 2015-12-11 NOTE — Progress Notes (Signed)
Cardiologist denies  Medical Md is Dr.Stephen Sarajane Jews  Stress test in epic from 2003  Echo denies  Heart cath denies  EKG in epic from 03-19-15  CXR in epic from 01-30-15

## 2015-12-17 MED ORDER — CEFAZOLIN SODIUM-DEXTROSE 2-4 GM/100ML-% IV SOLN
2.0000 g | INTRAVENOUS | Status: AC
  Administered 2015-12-18 (×2): 2 g via INTRAVENOUS
  Filled 2015-12-17: qty 100

## 2015-12-18 ENCOUNTER — Inpatient Hospital Stay (HOSPITAL_COMMUNITY): Payer: Medicare Other

## 2015-12-18 ENCOUNTER — Inpatient Hospital Stay (HOSPITAL_COMMUNITY)
Admission: RE | Admit: 2015-12-18 | Discharge: 2015-12-21 | DRG: 455 | Disposition: A | Discharge status: Rehabilitation Facility | Payer: Medicare Other | Source: Ambulatory Visit | Attending: Neurosurgery | Admitting: Neurosurgery

## 2015-12-18 ENCOUNTER — Inpatient Hospital Stay (HOSPITAL_COMMUNITY): Payer: Medicare Other | Admitting: Certified Registered Nurse Anesthetist

## 2015-12-18 ENCOUNTER — Encounter (HOSPITAL_COMMUNITY)
Admission: RE | Disposition: A | Discharge status: Rehabilitation Facility | Payer: Self-pay | Source: Ambulatory Visit | Attending: Neurosurgery

## 2015-12-18 ENCOUNTER — Encounter (HOSPITAL_COMMUNITY): Payer: Self-pay | Admitting: Certified Registered Nurse Anesthetist

## 2015-12-18 DIAGNOSIS — J302 Other seasonal allergic rhinitis: Secondary | ICD-10-CM | POA: Diagnosis present

## 2015-12-18 DIAGNOSIS — Z419 Encounter for procedure for purposes other than remedying health state, unspecified: Secondary | ICD-10-CM

## 2015-12-18 DIAGNOSIS — M4712 Other spondylosis with myelopathy, cervical region: Secondary | ICD-10-CM | POA: Diagnosis present

## 2015-12-18 DIAGNOSIS — N390 Urinary tract infection, site not specified: Secondary | ICD-10-CM | POA: Diagnosis not present

## 2015-12-18 DIAGNOSIS — M549 Dorsalgia, unspecified: Secondary | ICD-10-CM | POA: Diagnosis not present

## 2015-12-18 DIAGNOSIS — I10 Essential (primary) hypertension: Secondary | ICD-10-CM | POA: Diagnosis present

## 2015-12-18 DIAGNOSIS — G8254 Quadriplegia, C5-C7 incomplete: Secondary | ICD-10-CM | POA: Diagnosis not present

## 2015-12-18 DIAGNOSIS — Z7982 Long term (current) use of aspirin: Secondary | ICD-10-CM

## 2015-12-18 DIAGNOSIS — N319 Neuromuscular dysfunction of bladder, unspecified: Secondary | ICD-10-CM | POA: Diagnosis not present

## 2015-12-18 DIAGNOSIS — G8929 Other chronic pain: Secondary | ICD-10-CM | POA: Diagnosis present

## 2015-12-18 DIAGNOSIS — F419 Anxiety disorder, unspecified: Secondary | ICD-10-CM | POA: Diagnosis present

## 2015-12-18 DIAGNOSIS — R609 Edema, unspecified: Secondary | ICD-10-CM | POA: Diagnosis not present

## 2015-12-18 DIAGNOSIS — Z7952 Long term (current) use of systemic steroids: Secondary | ICD-10-CM | POA: Diagnosis not present

## 2015-12-18 DIAGNOSIS — R531 Weakness: Secondary | ICD-10-CM | POA: Diagnosis present

## 2015-12-18 DIAGNOSIS — G8252 Quadriplegia, C1-C4 incomplete: Secondary | ICD-10-CM | POA: Diagnosis not present

## 2015-12-18 DIAGNOSIS — Z888 Allergy status to other drugs, medicaments and biological substances status: Secondary | ICD-10-CM

## 2015-12-18 DIAGNOSIS — K5901 Slow transit constipation: Secondary | ICD-10-CM | POA: Diagnosis not present

## 2015-12-18 DIAGNOSIS — Z881 Allergy status to other antibiotic agents status: Secondary | ICD-10-CM | POA: Diagnosis not present

## 2015-12-18 DIAGNOSIS — F411 Generalized anxiety disorder: Secondary | ICD-10-CM | POA: Diagnosis not present

## 2015-12-18 DIAGNOSIS — Z87891 Personal history of nicotine dependence: Secondary | ICD-10-CM | POA: Diagnosis not present

## 2015-12-18 DIAGNOSIS — Z885 Allergy status to narcotic agent status: Secondary | ICD-10-CM | POA: Diagnosis not present

## 2015-12-18 DIAGNOSIS — G8250 Quadriplegia, unspecified: Secondary | ICD-10-CM | POA: Diagnosis not present

## 2015-12-18 DIAGNOSIS — M797 Fibromyalgia: Secondary | ICD-10-CM | POA: Diagnosis present

## 2015-12-18 DIAGNOSIS — R269 Unspecified abnormalities of gait and mobility: Secondary | ICD-10-CM | POA: Diagnosis not present

## 2015-12-18 DIAGNOSIS — G959 Disease of spinal cord, unspecified: Secondary | ICD-10-CM | POA: Diagnosis not present

## 2015-12-18 DIAGNOSIS — K219 Gastro-esophageal reflux disease without esophagitis: Secondary | ICD-10-CM | POA: Diagnosis present

## 2015-12-18 DIAGNOSIS — Z79899 Other long term (current) drug therapy: Secondary | ICD-10-CM | POA: Diagnosis not present

## 2015-12-18 DIAGNOSIS — M62838 Other muscle spasm: Secondary | ICD-10-CM | POA: Diagnosis present

## 2015-12-18 HISTORY — PX: ANTERIOR CERVICAL DECOMP/DISCECTOMY FUSION: SHX1161

## 2015-12-18 HISTORY — PX: POSTERIOR CERVICAL FUSION/FORAMINOTOMY: SHX5038

## 2015-12-18 LAB — ABO/RH: ABO/RH(D): B POS

## 2015-12-18 LAB — TYPE AND SCREEN
ABO/RH(D): B POS
ANTIBODY SCREEN: NEGATIVE

## 2015-12-18 SURGERY — ANTERIOR CERVICAL DECOMPRESSION/DISCECTOMY FUSION 3 LEVELS
Anesthesia: General | Site: Neck

## 2015-12-18 MED ORDER — LIDOCAINE-EPINEPHRINE 1 %-1:100000 IJ SOLN
INTRAMUSCULAR | Status: DC | PRN
  Administered 2015-12-18: 8 mL

## 2015-12-18 MED ORDER — FENTANYL CITRATE (PF) 250 MCG/5ML IJ SOLN
INTRAMUSCULAR | Status: AC
  Filled 2015-12-18: qty 5

## 2015-12-18 MED ORDER — BACITRACIN ZINC 500 UNIT/GM EX OINT
TOPICAL_OINTMENT | CUTANEOUS | Status: DC | PRN
  Administered 2015-12-18: 1 via TOPICAL

## 2015-12-18 MED ORDER — VITAMIN B-12 100 MCG PO TABS
1000.0000 ug | ORAL_TABLET | Freq: Every day | ORAL | Status: DC
  Administered 2015-12-19 – 2015-12-20 (×2): 1000 ug via ORAL
  Filled 2015-12-18 (×3): qty 10

## 2015-12-18 MED ORDER — TIZANIDINE HCL 4 MG PO TABS: 2.0000 mg | ORAL_TABLET | Freq: Every evening | ORAL | Status: DC | PRN

## 2015-12-18 MED ORDER — MIDAZOLAM HCL 2 MG/2ML IJ SOLN
INTRAMUSCULAR | Status: AC
  Filled 2015-12-18: qty 2

## 2015-12-18 MED ORDER — ROCURONIUM BROMIDE 50 MG/5ML IV SOLN
INTRAVENOUS | Status: AC
  Filled 2015-12-18: qty 1

## 2015-12-18 MED ORDER — TIZANIDINE HCL 2 MG PO CAPS
2.0000 mg | ORAL_CAPSULE | Freq: Every evening | ORAL | Status: DC | PRN
  Filled 2015-12-18: qty 1

## 2015-12-18 MED ORDER — LACTATED RINGERS IV SOLN: INTRAVENOUS | Status: DC | PRN

## 2015-12-18 MED ORDER — PROPOFOL 10 MG/ML IV BOLUS
INTRAVENOUS | Status: AC
  Filled 2015-12-18: qty 20

## 2015-12-18 MED ORDER — LABETALOL HCL 5 MG/ML IV SOLN
INTRAVENOUS | Status: DC | PRN
  Administered 2015-12-18: 10 mg via INTRAVENOUS
  Administered 2015-12-18: 20 mg via INTRAVENOUS
  Administered 2015-12-18: 10 mg via INTRAVENOUS

## 2015-12-18 MED ORDER — PANTOPRAZOLE SODIUM 40 MG IV SOLR
40.0000 mg | Freq: Every day | INTRAVENOUS | Status: DC
  Administered 2015-12-18 – 2015-12-20 (×3): 40 mg via INTRAVENOUS
  Filled 2015-12-18 (×3): qty 40

## 2015-12-18 MED ORDER — PHENYLEPHRINE HCL 10 MG/ML IJ SOLN
10.0000 mg | INTRAVENOUS | Status: DC | PRN
  Administered 2015-12-18: 20 ug/min via INTRAVENOUS

## 2015-12-18 MED ORDER — PHENOL 1.4 % MT LIQD: 1.0000 | OROMUCOSAL | Status: DC | PRN

## 2015-12-18 MED ORDER — ALBUMIN HUMAN 5 % IV SOLN
INTRAVENOUS | Status: DC | PRN
  Administered 2015-12-18 (×2): via INTRAVENOUS

## 2015-12-18 MED ORDER — BISACODYL 10 MG RE SUPP: 10.0000 mg | Freq: Every day | RECTAL | Status: DC | PRN

## 2015-12-18 MED ORDER — METHOCARBAMOL 1000 MG/10ML IJ SOLN
500.0000 mg | Freq: Four times a day (QID) | INTRAVENOUS | Status: DC | PRN
  Filled 2015-12-18: qty 5

## 2015-12-18 MED ORDER — ROCURONIUM BROMIDE 100 MG/10ML IV SOLN
INTRAVENOUS | Status: DC | PRN
  Administered 2015-12-18 (×4): 10 mg via INTRAVENOUS
  Administered 2015-12-18: 50 mg via INTRAVENOUS

## 2015-12-18 MED ORDER — MENTHOL 3 MG MT LOZG: 1.0000 | LOZENGE | OROMUCOSAL | Status: DC | PRN

## 2015-12-18 MED ORDER — EPHEDRINE 5 MG/ML INJ
INTRAVENOUS | Status: AC
  Filled 2015-12-18: qty 10

## 2015-12-18 MED ORDER — SODIUM CHLORIDE 0.9 % IV SOLN: 250.0000 mL | INTRAVENOUS | Status: DC

## 2015-12-18 MED ORDER — DOCUSATE SODIUM 100 MG PO CAPS
100.0000 mg | ORAL_CAPSULE | Freq: Two times a day (BID) | ORAL | Status: DC
  Administered 2015-12-19 – 2015-12-20 (×4): 100 mg via ORAL
  Filled 2015-12-18 (×6): qty 1

## 2015-12-18 MED ORDER — SODIUM CHLORIDE 0.9% FLUSH
3.0000 mL | Freq: Two times a day (BID) | INTRAVENOUS | Status: DC
Start: 2015-12-18 — End: 2015-12-21
  Administered 2015-12-18 – 2015-12-21 (×5): 3 mL via INTRAVENOUS

## 2015-12-18 MED ORDER — MAGNESIUM OXIDE 400 (241.3 MG) MG PO TABS
400.0000 mg | ORAL_TABLET | Freq: Every day | ORAL | Status: DC
Start: 2015-12-18 — End: 2015-12-21
  Administered 2015-12-19 – 2015-12-21 (×3): 400 mg via ORAL
  Filled 2015-12-18 (×6): qty 1

## 2015-12-18 MED ORDER — FENTANYL CITRATE (PF) 100 MCG/2ML IJ SOLN
25.0000 ug | INTRAMUSCULAR | Status: DC | PRN
  Administered 2015-12-18 (×2): 50 ug via INTRAVENOUS

## 2015-12-18 MED ORDER — HYDROMORPHONE HCL 1 MG/ML IJ SOLN
0.5000 mg | INTRAMUSCULAR | Status: DC | PRN
  Administered 2015-12-19 (×3): 1 mg via INTRAVENOUS
  Filled 2015-12-18 (×5): qty 1

## 2015-12-18 MED ORDER — POLYETHYLENE GLYCOL 3350 17 G PO PACK: 17.0000 g | PACK | Freq: Every day | ORAL | Status: DC | PRN

## 2015-12-18 MED ORDER — ACETAMINOPHEN 325 MG PO TABS
650.0000 mg | ORAL_TABLET | ORAL | Status: DC | PRN
  Filled 2015-12-18: qty 2

## 2015-12-18 MED ORDER — SENNA 8.6 MG PO TABS
1.0000 | ORAL_TABLET | Freq: Two times a day (BID) | ORAL | Status: DC
  Administered 2015-12-18 – 2015-12-20 (×5): 8.6 mg via ORAL
  Filled 2015-12-18 (×6): qty 1

## 2015-12-18 MED ORDER — METHOCARBAMOL 500 MG PO TABS
500.0000 mg | ORAL_TABLET | Freq: Four times a day (QID) | ORAL | Status: DC | PRN
  Administered 2015-12-19: 500 mg via ORAL
  Filled 2015-12-18: qty 1

## 2015-12-18 MED ORDER — FLEET ENEMA 7-19 GM/118ML RE ENEM: 1.0000 | ENEMA | Freq: Once | RECTAL | Status: DC | PRN

## 2015-12-18 MED ORDER — CEFAZOLIN SODIUM-DEXTROSE 2-4 GM/100ML-% IV SOLN
2.0000 g | Freq: Three times a day (TID) | INTRAVENOUS | Status: AC
  Administered 2015-12-18 – 2015-12-19 (×2): 2 g via INTRAVENOUS
  Filled 2015-12-18 (×2): qty 100

## 2015-12-18 MED ORDER — FENTANYL CITRATE (PF) 100 MCG/2ML IJ SOLN
INTRAMUSCULAR | Status: DC | PRN
  Administered 2015-12-18: 100 ug via INTRAVENOUS
  Administered 2015-12-18 (×2): 50 ug via INTRAVENOUS
  Administered 2015-12-18: 100 ug via INTRAVENOUS
  Administered 2015-12-18: 150 ug via INTRAVENOUS
  Administered 2015-12-18: 50 ug via INTRAVENOUS

## 2015-12-18 MED ORDER — HYDROCHLOROTHIAZIDE 25 MG PO TABS
25.0000 mg | ORAL_TABLET | Freq: Every day | ORAL | Status: DC
Start: 2015-12-18 — End: 2015-12-21
  Administered 2015-12-19 – 2015-12-21 (×3): 25 mg via ORAL
  Filled 2015-12-18 (×4): qty 1

## 2015-12-18 MED ORDER — FAMOTIDINE 20 MG PO TABS
20.0000 mg | ORAL_TABLET | Freq: Every day | ORAL | Status: DC
  Administered 2015-12-19 – 2015-12-21 (×3): 20 mg via ORAL
  Filled 2015-12-18 (×4): qty 1

## 2015-12-18 MED ORDER — DEXAMETHASONE SODIUM PHOSPHATE 10 MG/ML IJ SOLN
INTRAMUSCULAR | Status: DC | PRN
  Administered 2015-12-18: 10 mg via INTRAVENOUS

## 2015-12-18 MED ORDER — THROMBIN 5000 UNITS EX SOLR
OROMUCOSAL | Status: DC | PRN
  Administered 2015-12-18: 10 mL via TOPICAL
  Administered 2015-12-18: 5 mL via TOPICAL
  Administered 2015-12-18: 10 mL via TOPICAL

## 2015-12-18 MED ORDER — ACETAMINOPHEN 650 MG RE SUPP: 650.0000 mg | RECTAL | Status: DC | PRN

## 2015-12-18 MED ORDER — PREDNISONE 5 MG PO TABS
10.0000 mg | ORAL_TABLET | Freq: Two times a day (BID) | ORAL | Status: DC
  Administered 2015-12-19 – 2015-12-21 (×5): 10 mg via ORAL
  Filled 2015-12-18 (×5): qty 2

## 2015-12-18 MED ORDER — LACTATED RINGERS IV SOLN
INTRAVENOUS | Status: DC
Start: 2015-12-18 — End: 2015-12-18
  Administered 2015-12-18 (×4): via INTRAVENOUS

## 2015-12-18 MED ORDER — LIDOCAINE HCL (CARDIAC) 20 MG/ML IV SOLN
INTRAVENOUS | Status: DC | PRN
  Administered 2015-12-18: 80 mg via INTRAVENOUS

## 2015-12-18 MED ORDER — HYDROMORPHONE HCL 1 MG/ML IJ SOLN
INTRAMUSCULAR | Status: AC
  Administered 2015-12-18: 0.5 mg
  Filled 2015-12-18: qty 1

## 2015-12-18 MED ORDER — THROMBIN 20000 UNITS EX SOLR
CUTANEOUS | Status: DC | PRN
  Administered 2015-12-18: 20 mL via TOPICAL

## 2015-12-18 MED ORDER — LISINOPRIL 20 MG PO TABS
20.0000 mg | ORAL_TABLET | Freq: Every day | ORAL | Status: DC
  Administered 2015-12-19 – 2015-12-21 (×3): 20 mg via ORAL
  Filled 2015-12-18 (×4): qty 1

## 2015-12-18 MED ORDER — PROPOFOL 10 MG/ML IV BOLUS
INTRAVENOUS | Status: DC | PRN
  Administered 2015-12-18: 130 mg via INTRAVENOUS

## 2015-12-18 MED ORDER — MIDAZOLAM HCL 5 MG/5ML IJ SOLN
INTRAMUSCULAR | Status: DC | PRN
  Administered 2015-12-18: 2 mg via INTRAVENOUS

## 2015-12-18 MED ORDER — BUPIVACAINE HCL 0.5 % IJ SOLN
INTRAMUSCULAR | Status: DC | PRN
  Administered 2015-12-18: 8 mL

## 2015-12-18 MED ORDER — SUCCINYLCHOLINE CHLORIDE 200 MG/10ML IV SOSY
PREFILLED_SYRINGE | INTRAVENOUS | Status: AC
  Filled 2015-12-18: qty 10

## 2015-12-18 MED ORDER — SODIUM CHLORIDE 0.9 % IR SOLN
Status: DC | PRN
  Administered 2015-12-18 (×2): 500 mL

## 2015-12-18 MED ORDER — ONDANSETRON HCL 4 MG/2ML IJ SOLN
4.0000 mg | INTRAMUSCULAR | Status: DC | PRN
  Administered 2015-12-19: 4 mg via INTRAVENOUS
  Filled 2015-12-18: qty 2

## 2015-12-18 MED ORDER — FENTANYL CITRATE (PF) 100 MCG/2ML IJ SOLN
INTRAMUSCULAR | Status: AC
  Filled 2015-12-18: qty 2

## 2015-12-18 MED ORDER — SODIUM CHLORIDE 0.9% FLUSH: 3.0000 mL | INTRAVENOUS | Status: DC | PRN

## 2015-12-18 MED ORDER — ONDANSETRON HCL 4 MG/2ML IJ SOLN
INTRAMUSCULAR | Status: AC
  Filled 2015-12-18: qty 2

## 2015-12-18 MED ORDER — SODIUM CHLORIDE 0.9 % IV SOLN
INTRAVENOUS | Status: DC
  Administered 2015-12-19: 1000 mL via INTRAVENOUS

## 2015-12-18 MED ORDER — SUGAMMADEX SODIUM 200 MG/2ML IV SOLN
INTRAVENOUS | Status: DC | PRN
  Administered 2015-12-18: 150 mg via INTRAVENOUS

## 2015-12-18 MED ORDER — OXYCODONE-ACETAMINOPHEN 5-325 MG PO TABS
1.0000 | ORAL_TABLET | ORAL | Status: DC | PRN
  Administered 2015-12-19 – 2015-12-20 (×4): 2 via ORAL
  Administered 2015-12-21: 1 via ORAL
  Filled 2015-12-18: qty 2
  Filled 2015-12-18: qty 1
  Filled 2015-12-18 (×3): qty 2

## 2015-12-18 MED ORDER — ALPRAZOLAM 0.5 MG PO TABS
0.5000 mg | ORAL_TABLET | Freq: Two times a day (BID) | ORAL | Status: DC
  Administered 2015-12-18 – 2015-12-21 (×6): 0.5 mg via ORAL
  Filled 2015-12-18 (×6): qty 1

## 2015-12-18 MED ORDER — OMEGA-3 FATTY ACIDS 1000 MG PO CAPS
1.0000 g | ORAL_CAPSULE | Freq: Every day | ORAL | Status: DC
  Filled 2015-12-18: qty 1

## 2015-12-18 MED ORDER — EPHEDRINE SULFATE-NACL 50-0.9 MG/10ML-% IV SOSY
PREFILLED_SYRINGE | INTRAVENOUS | Status: DC | PRN
  Administered 2015-12-18: 5 mg via INTRAVENOUS

## 2015-12-18 MED ORDER — ONDANSETRON HCL 4 MG/2ML IJ SOLN
INTRAMUSCULAR | Status: DC | PRN
  Administered 2015-12-18: 4 mg via INTRAVENOUS

## 2015-12-18 MED ORDER — 0.9 % SODIUM CHLORIDE (POUR BTL) OPTIME
TOPICAL | Status: DC | PRN
  Administered 2015-12-18: 1000 mL

## 2015-12-18 SURGICAL SUPPLY — 92 items
BAG DECANTER FOR FLEXI CONT (MISCELLANEOUS) ×8 IMPLANT
BENZOIN TINCTURE PRP APPL 2/3 (GAUZE/BANDAGES/DRESSINGS) ×8 IMPLANT
BIT DRILL 2.4X (BIT) ×2 IMPLANT
BIT DRILL NEURO 2X3.1 SFT TUCH (MISCELLANEOUS) ×2 IMPLANT
BIT DRL 2.4X (BIT) ×2
BLADE CLIPPER SURG (BLADE) ×4 IMPLANT
BLADE SURG 11 STRL SS (BLADE) ×4 IMPLANT
BLADE ULTRA TIP 2M (BLADE) IMPLANT
BUR MATCHSTICK NEURO 3.0 LAGG (BURR) ×12 IMPLANT
CAGE EXPANSE 8X6X6 (Cage) ×8 IMPLANT
CAGE PEEK 6X14X11 (Cage) ×4 IMPLANT
CAGE PEEK 7X14X11 (Cage) ×2 IMPLANT
CAGE SPNL 11X14X7XRADOPQ (Cage) ×2 IMPLANT
CANISTER SUCT 3000ML PPV (MISCELLANEOUS) ×8 IMPLANT
CLOSURE WOUND 1/2 X4 (GAUZE/BANDAGES/DRESSINGS)
DECANTER SPIKE VIAL GLASS SM (MISCELLANEOUS) ×8 IMPLANT
DERMABOND ADVANCED (GAUZE/BANDAGES/DRESSINGS) ×4
DERMABOND ADVANCED .7 DNX12 (GAUZE/BANDAGES/DRESSINGS) ×4 IMPLANT
DRAIN CHANNEL 10M FLAT 3/4 FLT (DRAIN) IMPLANT
DRAPE C-ARM 42X72 X-RAY (DRAPES) ×16 IMPLANT
DRAPE LAPAROTOMY 100X72 PEDS (DRAPES) ×8 IMPLANT
DRAPE MICROSCOPE LEICA (MISCELLANEOUS) ×4 IMPLANT
DRAPE POUCH INSTRU U-SHP 10X18 (DRAPES) ×8 IMPLANT
DRAPE PROXIMA HALF (DRAPES) IMPLANT
DRILL BIT (BIT) ×2
DRILL NEURO 2X3.1 SOFT TOUCH (MISCELLANEOUS) ×4
DRSG OPSITE POSTOP 3X4 (GAUZE/BANDAGES/DRESSINGS) ×4 IMPLANT
DRSG TEGADERM 4X4.75 (GAUZE/BANDAGES/DRESSINGS) ×16 IMPLANT
DURAPREP 6ML APPLICATOR 50/CS (WOUND CARE) ×8 IMPLANT
ELECT COATED BLADE 2.86 ST (ELECTRODE) ×4 IMPLANT
ELECT REM PT RETURN 9FT ADLT (ELECTROSURGICAL) ×8
ELECTRODE REM PT RTRN 9FT ADLT (ELECTROSURGICAL) ×4 IMPLANT
EVACUATOR SILICONE 100CC (DRAIN) IMPLANT
GAUZE SPONGE 4X4 12PLY STRL (GAUZE/BANDAGES/DRESSINGS) IMPLANT
GAUZE SPONGE 4X4 16PLY XRAY LF (GAUZE/BANDAGES/DRESSINGS) IMPLANT
GLOVE BIO SURGEON STRL SZ 6.5 (GLOVE) ×9 IMPLANT
GLOVE BIO SURGEONS STRL SZ 6.5 (GLOVE) ×3
GLOVE BIOGEL PI IND STRL 7.0 (GLOVE) ×4 IMPLANT
GLOVE BIOGEL PI IND STRL 7.5 (GLOVE) ×4 IMPLANT
GLOVE BIOGEL PI INDICATOR 7.0 (GLOVE) ×4
GLOVE BIOGEL PI INDICATOR 7.5 (GLOVE) ×4
GLOVE ECLIPSE 7.0 STRL STRAW (GLOVE) ×8 IMPLANT
GLOVE ECLIPSE 9.0 STRL (GLOVE) ×4 IMPLANT
GLOVE EXAM NITRILE LRG STRL (GLOVE) IMPLANT
GLOVE EXAM NITRILE MD LF STRL (GLOVE) IMPLANT
GLOVE EXAM NITRILE XL STR (GLOVE) IMPLANT
GLOVE EXAM NITRILE XS STR PU (GLOVE) IMPLANT
GLOVE SURG SS PI 6.5 STRL IVOR (GLOVE) ×8 IMPLANT
GOWN STRL REUS W/ TWL LRG LVL3 (GOWN DISPOSABLE) ×8 IMPLANT
GOWN STRL REUS W/ TWL XL LVL3 (GOWN DISPOSABLE) ×4 IMPLANT
GOWN STRL REUS W/TWL 2XL LVL3 (GOWN DISPOSABLE) IMPLANT
GOWN STRL REUS W/TWL LRG LVL3 (GOWN DISPOSABLE) ×8
GOWN STRL REUS W/TWL XL LVL3 (GOWN DISPOSABLE) ×4
HEMOSTAT POWDER KIT SURGIFOAM (HEMOSTASIS) IMPLANT
HEMOSTAT POWDER SURGIFOAM 1G (HEMOSTASIS) ×12 IMPLANT
KIT BASIN OR (CUSTOM PROCEDURE TRAY) ×8 IMPLANT
KIT INFUSE SMALL (Orthopedic Implant) ×4 IMPLANT
KIT ROOM TURNOVER OR (KITS) ×8 IMPLANT
LIQUID BAND (GAUZE/BANDAGES/DRESSINGS) ×4 IMPLANT
NEEDLE HYPO 25X1 1.5 SAFETY (NEEDLE) ×8 IMPLANT
NEEDLE SPNL 22GX3.5 QUINCKE BK (NEEDLE) ×4 IMPLANT
NS IRRIG 1000ML POUR BTL (IV SOLUTION) ×8 IMPLANT
PACK LAMINECTOMY NEURO (CUSTOM PROCEDURE TRAY) ×8 IMPLANT
PAD ARMBOARD 7.5X6 YLW CONV (MISCELLANEOUS) ×24 IMPLANT
PIN MAYFIELD SKULL DISP (PIN) ×4 IMPLANT
PLATE 2 42.5XLCK NS SPNE CVD (Plate) ×2 IMPLANT
PLATE 2 ATLANTIS TRANS (Plate) ×2 IMPLANT
PUTTY BONE DBX 2.5 MIS (Bone Implant) ×4 IMPLANT
ROD VERTEX 60MM (Rod) ×8 IMPLANT
RUBBERBAND STERILE (MISCELLANEOUS) ×8 IMPLANT
SCREW 3.5X12 (Screw) ×12 IMPLANT
SCREW 4.0X13 (Screw) ×12 IMPLANT
SCREW BN 12X3.5XMA SPNE (Screw) ×12 IMPLANT
SCREW BN 13X4XSLF DRL FXANG (Screw) ×12 IMPLANT
SCREW CANCELLOUS 3.5X14MM (Screw) ×8 IMPLANT
SCREW SET M6 (Screw) ×32 IMPLANT
SPONGE INTESTINAL PEANUT (DISPOSABLE) ×4 IMPLANT
SPONGE LAP 4X18 X RAY DECT (DISPOSABLE) IMPLANT
SPONGE SURGIFOAM ABS GEL 100 (HEMOSTASIS) ×8 IMPLANT
STAPLER SKIN PROX WIDE 3.9 (STAPLE) ×4 IMPLANT
STRIP CLOSURE SKIN 1/2X4 (GAUZE/BANDAGES/DRESSINGS) IMPLANT
SUT ETHILON 3 0 FSL (SUTURE) IMPLANT
SUT VIC AB 0 CT1 18XCR BRD8 (SUTURE) ×6 IMPLANT
SUT VIC AB 0 CT1 8-18 (SUTURE) ×6
SUT VIC AB 2-0 CT1 18 (SUTURE) ×4 IMPLANT
SUT VIC AB 3-0 SH 8-18 (SUTURE) ×8 IMPLANT
SUT VICRYL 3-0 RB1 18 ABS (SUTURE) ×16 IMPLANT
TAPE CLOTH 2X10 TAN LF (GAUZE/BANDAGES/DRESSINGS) ×4 IMPLANT
TOWEL OR 17X24 6PK STRL BLUE (TOWEL DISPOSABLE) ×8 IMPLANT
TOWEL OR 17X26 10 PK STRL BLUE (TOWEL DISPOSABLE) ×8 IMPLANT
UNDERPAD 30X30 INCONTINENT (UNDERPADS AND DIAPERS) IMPLANT
WATER STERILE IRR 1000ML POUR (IV SOLUTION) ×8 IMPLANT

## 2015-12-18 NOTE — Progress Notes (Signed)
Utilization review completed.  
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
## 2015-12-18 NOTE — Anesthesia Preprocedure Evaluation (Addendum)
Anesthesia Evaluation  Patient identified by MRN, date of birth, ID band Patient awake    Reviewed: Allergy & Precautions, NPO status , Patient's Chart, lab work & pertinent test results  Airway Mallampati: II  TM Distance: >3 FB Neck ROM: Full    Dental  (+) Edentulous Upper, Edentulous Lower   Pulmonary pneumonia, former smoker,    breath sounds clear to auscultation       Cardiovascular hypertension, Pt. on medications  Rhythm:Regular Rate:Normal     Neuro/Psych Anxiety Depression    GI/Hepatic Neg liver ROS, GERD  Medicated and Controlled,  Endo/Other  negative endocrine ROS  Renal/GU negative Renal ROS     Musculoskeletal  (+) Arthritis , Osteoarthritis,  Fibromyalgia -  Abdominal   Peds  Hematology   Anesthesia Other Findings   Reproductive/Obstetrics                           Anesthesia Physical Anesthesia Plan  ASA: III  Anesthesia Plan: General   Post-op Pain Management:    Induction: Intravenous  Airway Management Planned: Oral ETT  Additional Equipment:   Intra-op Plan:   Post-operative Plan: Extubation in OR  Informed Consent: I have reviewed the patients History and Physical, chart, labs and discussed the procedure including the risks, benefits and alternatives for the proposed anesthesia with the patient or authorized representative who has indicated his/her understanding and acceptance.   Dental advisory given  Plan Discussed with: CRNA and Anesthesiologist  Anesthesia Plan Comments:         Anesthesia Quick Evaluation

## 2015-12-18 NOTE — Anesthesia Postprocedure Evaluation (Signed)
Anesthesia Post Note  Patient: [name]  Procedure(s) Performed: Procedure(s) (LRB): Cervical Three-Four/Four-Four-Five Anterior cervical decompression/diskectomy/fusion (N/A) Cervical Three-Six  laminectomy with lateral mass screws (N/A)  Patient location during evaluation: PACU Anesthesia Type: General Level of consciousness: awake Pain management: pain level controlled Vital Signs Assessment: post-procedure vital signs reviewed and stable Cardiovascular status: stable Anesthetic complications: no    Last Vitals:  Filed Vitals:   12/18/15 1611 12/18/15 1615  BP:  156/86  Pulse: 81 80  Temp: 36.1 C   Resp:  20    Last Pain:  Filed Vitals:   12/18/15 1621  PainSc: 9                  EDWARDS,Alissa Pharr

## 2015-12-18 NOTE — Progress Notes (Signed)
PT TO 5C PER rOBIN rn

## 2015-12-18 NOTE — Op Note (Signed)
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 Discectomy at C3-4, C4-5 for decompression of spinal cord and exiting nerve roots  2. Placement of intervertebral biomechanical device, Medtronic 38m lordotic @ C3-4, 728mlordotic @ C4-5 3. Placement of anterior instrumentation consisting of interbody plate and screws spanning C3-C5 4. Use of morselized bone allograft  5. Arthrodesis C3-4, C4-5, anterior interbody technique  6. Use of intraoperative microscope  STAGE 2: 1. C3-C6 laminectomy for decompression of spinal cord 2. Posterior segmental instrumentation, C3-C6 lateral mass screws  3. Posterolateral arthrodesis, C3-4, C4-5, C5-6 4. Use of morcellized bone autograft 5. Use of non-structural bone allograft - DBX putty 6. Use of BMP  SURGEON: Dr. NeConsuella LoseMD  ASSISTANT: Dr. H.Doreatha LewPool, MD  ANESTHESIA: General Endotracheal  EBL: 250cc  SPECIMENS: None  DRAINS: None  COMPLICATIONS: None immediate  CONDITION: Hemodynamically stable to PACU  HISTORY: Cindy MARCELLUSs a [age].o. woman initially presenting to the clinic with several months of progressively worsening quadriparesis. She was unable to walk unassisted prior to surgery. MRI demonstrated severe spinal cord compression and stenosis from C3-6. Surgical decompression and fusion was therefore indicated. Risks and benefits were reviewed with the patient and her family. After all questions were answered, consent was obtained.  PROCEDURE IN DETAIL: The patient was brought to the operating room and transferred to the operative table. After induction of general anesthesia, the patient was positioned on the operative table in the supine position with all pressure points meticulously padded. The skin of the neck was then prepped and draped in the usual sterile fashion.  After timeout was conducted, the skin was infiltrated with local anesthetic. Skin  incision was then made sharply and Bovie electrocautery was used to dissect the subcutaneous tissue until the platysma was identified. The platysma was then divided and undermined. The sternocleidomastoid muscle was then identified and, utilizing natural fascial planes in the neck, the prevertebral fascia was identified and the carotid sheath was retracted laterally and the trachea and esophagus retracted medially. Again using fluoroscopy, the correct disc spaces were identified. Bovie electrocautery was used to dissect in the subperiosteal plane and elevate the bilateral longus coli muscles. Self-retaining retractors were then placed. At this point, the microscope was draped and brought into the field, and the remainder of the case was done under the microscope using microdissecting technique.  The C4-5 disc space was incised sharply and rongeurs were use to initially complete a discectomy. The high-speed drill was then used to complete discectomy until the posterior annulus was identified and removed and the posterior longitudinal ligament was identified. Using a nerve hook, the PLL was elevated, and Kerrison rongeurs were used to remove the posterior longitudinal ligament and the ventral thecal sac was identified. Using a combination of curettes and rongeurs, complete decompression of the thecal sac and exiting nerve roots at this level was completed, and verified using micro-nerve hook. Note was made of thickened PLL and annulus.  At this point, a 72m41mordotic interbody cage was sized and packed with morcellized bone allograft. This was then inserted and tapped into place.  Attention was then turned to the C3-4 level. In a similar fashion, discectomy was completed initially with curettes and rongeurs, and completed with the drill. The PLL was again identified, elevated and incised. Using Kerrison rongeurs, decompression of the spinal cord and exiting roots at the C3-4 level was completed and confirmed with  a dissector. There was significant amount  of soft disc fragments which were compressing the thecal sac.  A 21m lordotic interbody cage was then sized and filled with bone allograft, and tapped into place. Position of the interbody devices was then confirmed with fluoroscopy.  After placement of the intervertebral devices, the anterior cervical plate was selected, and placed across the interspaces. Using a high-speed drill, the cortex of the cervical vertebral bodies was punctured, and screws inserted in the C3, C4, C5 levels. Final fluoroscopic images in AP and lateral projections were taken to confirm good hardware placement.  At this point, after all counts were verified to be correct, meticulous hemostasis was secured using a combination of bipolar electrocautery and passive hemostatics. The platysma muscle was then closed using interrupted 3-0 Vicryl sutures, and the skin was closed with an interrupted subcuticular stitch. Sterile dressings were then applied and the drapes removed.  The patient was then transferred to the bed, the [place] head holder was applied, and she was transferred back to the operative table in the prone position. All pressure points were meticulously padded. Care was taken to maintain neutral alignment of the cervical spine in the prone position. The skin of the posterior neck was then prepped and draped in the usual sterile fashion.  After a second timeout was conducted, midline skin incision was made sharply, and Bovie electrocautery was used to dissect through subcutaneous days tissue until the nuchal fascia was identified, and incised in the midline. Dissection was carried out in the midline in the avascular plane, until the spinous process of C3, C4, C5, and C6 were identified. Location was confirmed with intraoperative fluoroscopy. Subperiosteal dissection was then carried out along the lamina, lateral mass at C3, C4, C5, and C6. Self-retaining retractors were then  placed.  Pilot holes for lateral mass screws were then drilled and tapped at C3, C4, C5, and C6.  At this point, complete laminectomy extending from C3-C6 was completed using a combination of high-speed drill and Kerrison rongeurs. Bone collected during the decompression was saved for later use during fusion. There did appear to be several areas of calcified ligamentum flavum indenting the thecal sac posteriorly. Good decompression of the thecal sac was confirmed after laminectomy.  At this point, exposed bone surfaces and the facet joint complexes were decorticated using the high-speed drill at C3-4, C4-5, and C5-C6. Lateral mass screws were then placed at C3, C4, C5, and C6. A 60 mm rod was then selected with a preshaped lordosis. This was secured with set screws which were then final tightened.  Posterior lateral arthrodesis was then completed initially with placement of a small BMP kit placed in the posterior lateral recess, and was then covered with the morcellized bone autograft collected during the decompression.  Prior to placement of the graft, the wound was irrigated with copious amounts of normal saline irrigation. The wound was then closed in multiple layers using a combination of 0 and 3-0 Vicryl stitches. Skin was closed with Dermabond. Sterile dressing was then applied.  At the end of the case all sponge, needle, instrument, and cottonoid counts were correct. The patient tolerated the procedure well and was extubated in the room and taken to the postanesthesia care unit in stable condition.

## 2015-12-18 NOTE — Anesthesia Procedure Notes (Signed)
Procedure Name: Intubation Date/Time: 12/18/2015 10:33 AM Performed by: Carney Living Pre-anesthesia Checklist: Patient identified, Emergency Drugs available, Suction available, Patient being monitored and Timeout performed Patient Re-evaluated:Patient Re-evaluated prior to inductionOxygen Delivery Method: Circle system utilized Preoxygenation: Pre-oxygenation with 100% oxygen Intubation Type: IV induction Ventilation: Mask ventilation without difficulty Laryngoscope Size: Glidescope and 3 Tube type: Oral Tube size: 7.0 mm Number of attempts: 1 Airway Equipment and Method: Stylet Placement Confirmation: ETT inserted through vocal cords under direct vision,  positive ETCO2 and breath sounds checked- equal and bilateral Secured at: 22 cm Tube secured with: Tape Dental Injury: Teeth and Oropharynx as per pre-operative assessment  Comments: Glidescope utilized to facilitated intubation without neck extension.  DL X1 with glidescope, no neck extension, 7.0 ETT easily placed, VSS

## 2015-12-18 NOTE — Transfer of Care (Signed)
Immediate Anesthesia Transfer of Care Note  Patient: [name]  Procedure(s) Performed: Procedure(s) with comments: Cervical Three-Four/Four-Four-Five Anterior cervical decompression/diskectomy/fusion (N/A) - Cervical Three-Four/Four-Five Anterior cervical decompression/diskectomy/fusion Cervical Three-Six  laminectomy with lateral mass screws (N/A) - Cervical Three-Six  laminectomy with lateral mass screws  Patient Location: PACU  Anesthesia Type:General  Level of Consciousness: awake, alert , sedated and patient cooperative  Airway & Oxygen Therapy: Patient Spontanous Breathing and Patient connected to nasal cannula oxygen  Post-op Assessment: Report given to RN, Post -op Vital signs reviewed and stable, Patient moving all extremities and extremely weak l>r, upper and lower Dr. Kathyrn Sheriff aware and at bedside to assess   Post vital signs: Reviewed and stable  Last Vitals:  Filed Vitals:   12/18/15 0751  BP: 192/85  Pulse: 76  Resp: 18    Last Pain:  Filed Vitals:   12/18/15 0753  PainSc: 9          Complications: No apparent anesthesia complications

## 2015-12-19 ENCOUNTER — Encounter (HOSPITAL_COMMUNITY): Payer: Self-pay | Admitting: General Practice

## 2015-12-19 MED ORDER — OMEGA-3-ACID ETHYL ESTERS 1 G PO CAPS
1.0000 g | ORAL_CAPSULE | Freq: Every day | ORAL | Status: DC
Start: 2015-12-19 — End: 2015-12-21
  Administered 2015-12-20: 1 g via ORAL
  Filled 2015-12-19 (×2): qty 1

## 2015-12-19 NOTE — Progress Notes (Signed)
Patient arrived prior to stat of my shift about 1800, when I assessed her she was under the influence of anesthesia, not very cooperative. Since then she has a very sore throat and is not willing to use the Cepacol. She has also refused to ambulate this evening but i will try again around 0600.

## 2015-12-19 NOTE — NC FL2 (Signed)
[place] MEDICAID FL2 LEVEL OF CARE SCREENING TOOL     IDENTIFICATION  Patient Name: [name] Birthdate: [birth year] Sex: female Admission Date (Current Location): 12/18/2015  [facility] and [place] Number:  Herbalist and Address:  [facility], [place] [address] [place], [place] [address]      Provider Number: [id number]  Attending Physician Name and Address:  Consuella Lose, MD  Relative Name and Phone Number:       Current Level of Care: Hospital Recommended Level of Care: [place] Prior Approval Number:    Date Approved/Denied:   PASRR Number: [id number]  Discharge Plan: SNF    Current Diagnoses: Patient Active Problem List   Diagnosis Date Noted  . Cervical spondylosis with myelopathy 12/18/2015  . Chest pain 03/27/2014  . Unspecified constipation 12/02/2013  . Hypokalemia 05/31/2013  . Insomnia 05/29/2011  . Lipoma 02/05/2011  . HYPERLIPIDEMIA 09/24/2006  . Depression 04/29/2006  . Essential hypertension 04/29/2006  . Myalgia and myositis 04/29/2006  . GERD 03/15/1998    Orientation RESPIRATION BLADDER Height & Weight     Self, Place, Situation  Normal Continent Weight:   Height:     BEHAVIORAL SYMPTOMS/MOOD NEUROLOGICAL BOWEL NUTRITION STATUS      Continent Diet  AMBULATORY STATUS COMMUNICATION OF NEEDS Skin   Extensive Assist Verbally Surgical wounds                       Personal Care Assistance Level of Assistance  Bathing, Dressing Bathing Assistance: Maximum assistance   Dressing Assistance: Maximum assistance     Functional Limitations Info             SPECIAL CARE FACTORS FREQUENCY  PT (By licensed PT), OT (By licensed OT)     PT Frequency: 5/wk OT Frequency: 5/wk            Contractures      Additional Factors Info  Code Status, Allergies Code Status Info: FULL Allergies Info: Ciprofloxacin, Codeine, Escitalopram Oxalate, Gabapentin, Hydrocodone,  Oxybutynin Chloride           Current Medications (12/19/2015):  This is the current hospital active medication list Current Facility-Administered Medications  Medication Dose Route Frequency Provider Last Rate Last Dose  . 0.9 %  sodium chloride infusion   Intravenous Continuous Consuella Lose, MD 75 mL/hr at 12/19/15 0415 1,000 mL at 12/19/15 0415  . 0.9 %  sodium chloride infusion  250 mL Intravenous Continuous Consuella Lose, MD   Stopped at 12/18/15 1957  . acetaminophen (TYLENOL) tablet 650 mg  650 mg Oral Q4H PRN Consuella Lose, MD       Or  . acetaminophen (TYLENOL) suppository 650 mg  650 mg Rectal Q4H PRN Consuella Lose, MD      . ALPRAZolam Duanne Moron) tablet 0.5 mg  0.5 mg Oral BID Consuella Lose, MD   0.5 mg at 12/19/15 1003  . bisacodyl (DULCOLAX) suppository 10 mg  10 mg Rectal Daily PRN Consuella Lose, MD      . docusate sodium (COLACE) capsule 100 mg  100 mg Oral BID Consuella Lose, MD   100 mg at 12/19/15 1003  . famotidine (PEPCID) tablet 20 mg  20 mg Oral Daily Consuella Lose, MD   20 mg at 12/19/15 1003  . hydrochlorothiazide (HYDRODIURIL) tablet 25 mg  25 mg Oral Daily Consuella Lose, MD   25 mg at 12/19/15 1003  . HYDROmorphone (DILAUDID) injection 0.5-1 mg  0.5-1 mg  Intravenous Q2H PRN Consuella Lose, MD   1 mg at 12/19/15 0841  . lisinopril (PRINIVIL,ZESTRIL) tablet 20 mg  20 mg Oral Daily Consuella Lose, MD   20 mg at 12/19/15 1003  . magnesium oxide (MAG-OX) tablet 400 mg  400 mg Oral Daily Consuella Lose, MD   400 mg at 12/19/15 1003  . menthol-cetylpyridinium (CEPACOL) lozenge 3 mg  1 lozenge Oral PRN Consuella Lose, MD       Or  . phenol (CHLORASEPTIC) mouth spray 1 spray  1 spray Mouth/Throat PRN Consuella Lose, MD      . methocarbamol (ROBAXIN) tablet 500 mg  500 mg Oral Q6H PRN Consuella Lose, MD   500 mg at 12/19/15 0841   Or  . methocarbamol (ROBAXIN) 500 mg in dextrose 5 % 50 mL IVPB  500 mg Intravenous Q6H PRN  Consuella Lose, MD      . omega-3 acid ethyl esters (LOVAZA) capsule 1 g  1 g Oral Daily Consuella Lose, MD   1 g at 12/19/15 1005  . ondansetron (ZOFRAN) injection 4 mg  4 mg Intravenous Q4H PRN Consuella Lose, MD   4 mg at 12/19/15 [id number]  . oxyCODONE-acetaminophen (PERCOCET/ROXICET) 5-325 MG per tablet 1-2 tablet  1-2 tablet Oral Q4H PRN Consuella Lose, MD      . pantoprazole (PROTONIX) injection 40 mg  40 mg Intravenous QHS Consuella Lose, MD   40 mg at 12/18/15 2251  . polyethylene glycol (MIRALAX / GLYCOLAX) packet 17 g  17 g Oral Daily PRN Consuella Lose, MD      . predniSONE (DELTASONE) tablet 10 mg  10 mg Oral BID WC Consuella Lose, MD   10 mg at 12/19/15 0840  . senna (SENOKOT) tablet 8.6 mg  1 tablet Oral BID Consuella Lose, MD   8.6 mg at 12/19/15 1003  . sodium chloride flush (NS) 0.9 % injection 3 mL  3 mL Intravenous Q12H Consuella Lose, MD   3 mL at 12/18/15 2253  . sodium chloride flush (NS) 0.9 % injection 3 mL  3 mL Intravenous PRN Consuella Lose, MD      . sodium phosphate (FLEET) 7-19 GM/118ML enema 1 enema  1 enema Rectal Once PRN Consuella Lose, MD      . tiZANidine (ZANAFLEX) tablet 2 mg  2 mg Oral QHS PRN Consuella Lose, MD      . vitamin B-12 (CYANOCOBALAMIN) tablet 1,000 mcg  1,000 mcg Oral Daily Consuella Lose, MD   1,000 mcg at 12/19/15 1003     Discharge Medications: Please see discharge summary for a list of discharge medications.  Relevant Imaging Results:  Relevant Lab Results:   Additional Information SS#: [id number]  [name], [place]

## 2015-12-19 NOTE — Care Management Note (Addendum)
Case Management Note  Patient Details  Name: LYBERTI AFSHAR MRN: [id number] Date of Birth: [birth year]  Subjective/Objective:        Pt admitted and underwent:Cervical Three-Four/Four-Four-Five Anterior cervical decompression/diskectomy/fusion. Cervical Three-Six laminectomy with lateral mass screws.  She is from home with family.             Action/Plan: Awaiting PT/OT recs. CM following for d/c needs.   Expected Discharge Date:                  Expected Discharge Plan:     In-House Referral:     Discharge planning Services     Post Acute Care Choice:    Choice offered to:     DME Arranged:    DME Agency:     HH Arranged:    HH Agency:     Status of Service:  In process, will continue to follow  Medicare Important Message Given:    Date Medicare IM Given:    Medicare IM give by:    Date Additional Medicare IM Given:    Additional Medicare Important Message give by:     If discussed at [place] of Stay Meetings, dates discussed:    Additional Comments:  Pollie Friar, RN 12/19/2015, 2:19 PM

## 2015-12-19 NOTE — Progress Notes (Signed)
OT Cancellation Note  Patient Details Name: KERSTIN HASELEY MRN: [id number] DOB: 1946/02/19   Cancelled Treatment:    Reason Eval/Treat Not Completed: Pain limiting ability to participate (and pt reports getting sick)  Benito Mccreedy OTR/L [id number] 12/19/2015, 11:14 AM

## 2015-12-19 NOTE — Evaluation (Signed)
PPhysical Therapy Evaluation Patient Details Name: [name] MRN: [id number] DOB: Aug 08, 1945 Today's Date: 12/19/2015   History of Present Illness  Patient is a 70 y/o female with hx of fibromyalgia, HTN, HLD and depression presents with C 3-4, 5-6 ACDF and C3-6 laminectomy with lateral mass screws.  Clinical Impression  Patient presents with lethargy, pain, weakness throughout BUEs/LEs, decreased balance and impaired mobility s/p above. Mobility assessment limited today due to pain/dizziness as pt not able to tolerate sitting EOB. Adjusted cervical collar and reviewed precautions. Anticipate pt will need ST SNF to maximize independence and mobility prior to return home with sister. Will follow acutely.     Follow Up Recommendations SNF    Equipment Recommendations  None recommended by PT    Recommendations for Other Services       Precautions / Restrictions Precautions Precautions: Fall;Cervical Required Braces or Orthoses: Cervical Brace Cervical Brace: Hard collar Restrictions Weight Bearing Restrictions: No      Mobility  Bed Mobility Overal bed mobility: Needs Assistance Bed Mobility: Rolling;Sidelying to Sit;Sit to Sidelying Rolling: Min assist Sidelying to sit: HOB elevated;Mod assist     Sit to sidelying: Mod assist;HOB elevated General bed mobility comments: Step by step cues for log roll technique. Very slow. Assist to elevate trunk. + dizziness. Performed sidelying to sit x2 due to needing to lay down per pt report cause of pain. Assist to bring BLEs into bed.   Transfers                 General transfer comment: Deferred, "I can't do this right now," forcing self into sidelying  Ambulation/Gait                Stairs            Wheelchair Mobility    Modified Rankin (Stroke Patients Only)       Balance Overall balance assessment: Needs assistance Sitting-balance support: Feet unsupported;No upper extremity  supported Sitting balance-Leahy Scale: Poor Sitting balance - Comments: posterior bias and LOB without support. Min A when performing AROM BLEs.  Postural control: Posterior lean                                   Pertinent Vitals/Pain Pain Assessment: Faces Faces Pain Scale: Hurts worst Pain Location: neck Pain Descriptors / Indicators: Sore;Sharp;Guarding;Grimacing Pain Intervention(s): Monitored during session;Premedicated before session;Repositioned;Limited activity within patient's tolerance    Home Living Family/patient expects to be discharged to:: Skilled nursing facility Living Arrangements: Other relatives (sister) Available Help at Discharge: Family Type of Home: House Home Access: Stairs to enter Entrance Stairs-Rails: Right;Left;Can reach both Entrance Stairs-Number of Steps: 3 Home Layout: One level Home Equipment: Walker - 2 wheels;Bedside commode;Shower seat;Cane - single point      Prior Function Level of Independence: Independent with assistive device(s)         Comments: Using RW PTA. Supervision for ADLs and getting in/out of the shower. Downhill in mobility for the last few months per sister.     Hand Dominance        Extremity/Trunk Assessment   Upper Extremity Assessment: Defer to OT evaluation;Generalized weakness           Lower Extremity Assessment:  (Reports numbness in BLEs. )      Cervical / Trunk Assessment: Other exceptions  Communication   Communication: No difficulties  Cognition Arousal/Alertness: Lethargic Behavior During Therapy: Flat affect Overall  Cognitive Status: Difficult to assess (Seems WFL.)                      General Comments General comments (skin integrity, edema, etc.): Sister present in room during session.    Exercises        Assessment/Plan    PT Assessment Patient needs continued PT services  PT Diagnosis Acute pain;Generalized weakness   PT Problem List Decreased  strength;Pain;Impaired sensation;Decreased activity tolerance;Decreased balance;Decreased mobility;Decreased knowledge of precautions;Decreased safety awareness  PT Treatment Interventions Balance training;Gait training;Functional mobility training;Therapeutic activities;Therapeutic exercise;Patient/family education   PT Goals (Current goals can be found in the Care Plan section) Acute Rehab PT Goals Patient Stated Goal: make this pain better PT Goal Formulation: With patient Time For Goal Achievement: 01/02/16 Potential to Achieve Goals: Fair    Frequency Min 5X/week   Barriers to discharge Inaccessible home environment stairs to enter home    Co-evaluation               End of Session Equipment Utilized During Treatment: Cervical collar Activity Tolerance: Patient limited by lethargy;Patient limited by pain Patient left: in bed;with call bell/phone within reach;with bed alarm set;with family/visitor present;with SCD's reapplied Nurse Communication: Mobility status         Time: [id number] PT Time Calculation (min) (ACUTE ONLY): 18 min   Charges:   PT Evaluation $PT Eval Moderate Complexity: 1 Procedure     PT G Codes:        Jashira Cotugno A Aliahna Statzer 12/19/2015, 9:41 AM Wray Kearns, PT, DPT [phone]

## 2015-12-19 NOTE — Progress Notes (Signed)
No issues overnight. Pt reports some HA and nausea. Reports strength in arms and legs is unchanged from preop. Able to tolerate diet with mild dysphagia.  EXAM:  BP 138/75 mmHg  Pulse 84  Temp(Src) 97.6 F (36.4 C) (Oral)  Resp 20  SpO2 99%  LMP 03/31/2004  Awake, alert, oriented  Speech fluent, appropriate  CN grossly intact  4-/5 proximal BUE, 2/5 grip 4/5 proximal BLE (R stronger than L), 2-3/5 distal Wounds c/d/i  IMPRESSION:  70 y.o. female POD#1 s/p circumfrential C3-6 decompression/stabilization. At neurologic baseline with moderate quadriparesis.  PLAN: - Will cont inpatient PT/OT - d/c robaxin, can use tizanidine for spasms PRN

## 2015-12-19 NOTE — Clinical Social Work Note (Signed)
Clinical Social Work Assessment  Patient Details  Name: [name] MRN: [id number] Date of Birth: [birth year]  Date of referral:  12/19/15               Reason for consult:  Facility Placement                Permission sought to share information with:  Family Supports, Chartered certified accountant granted to share information::  Yes, Verbal Permission Granted  Name::     [employer]  Agency::  [facility]  Relationship::  sister  Contact Information:     Housing/Transportation Living arrangements for the past 2 months:  Single Family Home Source of Information:  Patient (siblings) Patient Interpreter Needed:  None Criminal Activity/Legal Involvement Pertinent to Current Situation/Hospitalization:  No - Comment as needed Significant Relationships:  Siblings Lives with:  Siblings Do you feel safe going back to the place where you live?  No Need for family participation in patient care:  Yes (Comment)  Care giving concerns:  Pt lives at home with sibling- no enough physical support available given pt current impairment following surgery   Social Worker assessment / plan:  CSW spoke with pt and pt sister concerning PT recommendation for SNF.  Pt was quiet during interview but alert.    Pt sister answered most questions- stated that pt normally lives with her but that it would be too much to handle right after pt surgery.  CSW discussed SNF and explained referral process.  Employment status:  Retired Forensic scientist:  Medicare PT Recommendations:  [place] / Referral to community resources:  [place]  Patient/Family's Response to care:  Pt and sister agreeable to SNF placement and are hopeful for placement in [place].  Patient/Family's Understanding of and Emotional Response to Diagnosis, Current Treatment, and Prognosis:  No questions or concerns hopeful pt will recover quickly.  Emotional  Assessment Appearance:  Appears stated age Attitude/Demeanor/Rapport:  Lethargic Affect (typically observed):  Quiet, Appropriate Orientation:  Oriented to Situation, Oriented to Place, Oriented to Self Alcohol / Substance use:  Not Applicable Psych involvement (Current and /or in the community):  No (Comment)  Discharge Needs  Concerns to be addressed:  Care Coordination Readmission within the last 30 days:  No Current discharge risk:  Physical Impairment Barriers to Discharge:  Continued Medical Work up   [employer], LCSW 12/19/2015, 11:22 AM

## 2015-12-19 NOTE — Progress Notes (Signed)
Have started search for SNF placement  Would need to remain inpatient until Friday to qualify for SNF (3 inpatient night Medicare rule)- if stable before then must return home or privately pay for SNF stay (approx $450/day)  CSW will continue to follow  Domenica Reamer, [place] Worker [phone]

## 2015-12-20 DIAGNOSIS — M4712 Other spondylosis with myelopathy, cervical region: Principal | ICD-10-CM

## 2015-12-20 DIAGNOSIS — G8250 Quadriplegia, unspecified: Secondary | ICD-10-CM

## 2015-12-20 MED ORDER — HEPARIN SODIUM (PORCINE) 5000 UNIT/ML IJ SOLN
5000.0000 [IU] | Freq: Three times a day (TID) | INTRAMUSCULAR | Status: DC
  Administered 2015-12-20 – 2015-12-21 (×3): 5000 [IU] via SUBCUTANEOUS
  Filled 2015-12-20 (×3): qty 1

## 2015-12-20 NOTE — Evaluation (Signed)
Occupational Therapy Evaluation Patient Details Name: [name] MRN: [id number] DOB: [birth year] Today's Date: 12/20/2015    History of Present Illness Patient is a 70 y/o female with hx of fibromyalgia, HTN, HLD and depression presents with C 3-4, 5-6 ACDF and C3-6 laminectomy with lateral mass screws.   Clinical Impression   Pt admitted for neck surgery per MD.  Pt with moderate quadriparesis per MD note  . Pt currently with functional limitations due to the deficits listed below (see OT Problem List).  Pt will benefit from skilled OT to increase their safety and independence with ADL and functional mobility for ADL to facilitate discharge to venue listed below.      Follow Up Recommendations  CIR    Equipment Recommendations  None recommended by OT    Recommendations for Other Services Rehab consult     Precautions / Restrictions Precautions Precautions: Fall;Cervical Required Braces or Orthoses: Cervical Brace Cervical Brace: Hard collar Restrictions Weight Bearing Restrictions: No      Mobility Bed Mobility Overal bed mobility: Needs Assistance Bed Mobility: Rolling;Sidelying to Sit Rolling: Min guard Sidelying to sit: Mod assist;HOB elevated       General bed mobility comments: Pt had just gotten back to bed and was on bed pan.  OT eval focused on BUE  Transfers Overall transfer level: Needs assistance Equipment used: Rolling walker (2 wheeled) Transfers: Sit to/from Stand Sit to Stand: Mod assist;+2 physical assistance         General transfer comment: NT    Balance Overall balance assessment: Needs assistance Sitting-balance support: Feet supported;No upper extremity supported Sitting balance-Leahy Scale: Fair Sitting balance - Comments: Posterior bias and LOB during scooting bottom to EOB and BLE AROM. Min A for balance at times esp when donning gown.   Standing balance support: During functional activity;Bilateral upper extremity  supported Standing balance-Leahy Scale: Zero Standing balance comment: Requires BUE on RW for support and Mod -mAx A for dynamic standing balance. Performed mini marches with emphasis on weight shifting, difficulty clearing LLE.                            ADL Overall ADL's : Needs assistance/impaired Eating/Feeding: Maximal assistance;Bed level Eating/Feeding Details (indicate cue type and reason): pt currently unable to hold fork to feed self - will need some adaptive equipment Grooming: Moderate assistance;Bed level Grooming Details (indicate cue type and reason): pts hands limit  pts abilty to hold needed items                               General ADL Comments: pt just had gotten back to bed and was on bed pan.                 Pertinent Vitals/Pain Pain Assessment: 0-10 Pain Score: 6  Faces Pain Scale: Hurts worst Pain Location: neck Pain Descriptors / Indicators: Sore Pain Intervention(s): Monitored during session     Hand Dominance     Extremity/Trunk Assessment Upper Extremity Assessment Upper Extremity Assessment: RUE deficits/detail;LUE deficits/detail RUE Deficits / Details: Pt with numbness in bilateral hands. Pt with decreased wrist extension as well as opposition.  Pt states this has been getting worse for monthes.  Pt may need splints for BUE (wrist) RUE Sensation: decreased light touch RUE Coordination: decreased fine motor LUE Sensation: decreased light touch LUE Coordination: decreased fine motor  Cervical / Trunk Assessment Cervical / Trunk Exceptions: s/p cervical surgery   Communication Communication Communication: No difficulties   Cognition Arousal/Alertness: Awake/alert Behavior During Therapy: WFL for tasks assessed/performed Overall Cognitive Status: Within Functional Limits for tasks assessed                     General Comments   explained importance of wrist extension in performing functional  activity such as self feeding and grooming.    Exercises    Focused on quality wrist extension as well as positioning of BUE to support wrist in neutral position.        Home Living Family/patient expects to be discharged to:: Skilled nursing facility Living Arrangements: Other relatives (sister) Available Help at Discharge: Family Type of Home: House Home Access: Stairs to enter [employer] of Steps: 3 Entrance Stairs-Rails: Right;Left;Can reach both Home Layout: One level               Home Equipment: [place] - 2 wheels;Bedside commode;Shower seat;Cane - single point          Prior Functioning/Environment Level of Independence: Independent with assistive device(s)        Comments: Using RW PTA. Supervision for ADLs and getting in/out of the shower. Downhill in mobility for the last few months per sister.    OT Diagnosis: Generalized weakness;Acute pain   OT Problem List: Decreased strength;Decreased range of motion;Impaired balance (sitting and/or standing);Impaired sensation;Pain;Decreased knowledge of precautions;Decreased knowledge of use of DME or AE;Impaired UE functional use   OT Treatment/Interventions:      OT Goals(Current goals can be found in the care plan section) Acute Rehab OT Goals Patient Stated Goal: feed myself better and be able to use my hands OT Goal Formulation: With patient/family Time For Goal Achievement: 01/03/16 Potential to Achieve Goals: Good  OT Frequency:                End of Session Nurse Communication: Mobility status  Activity Tolerance: Patient limited by pain Patient left: in bed;with call bell/phone within reach;with family/visitor present   Time: 1210-1231 OT Time Calculation (min): 21 min Charges:  OT General Charges $OT Visit: 1 Procedure OT Evaluation $OT Eval Moderate Complexity: 1 Procedure G-Codes:    Betsy Pries January 12, 2016, 12:43 PM

## 2015-12-20 NOTE — Progress Notes (Signed)
Rehab Admissions Coordinator Note:  Patient was screened by Cleatrice Burke for appropriateness for an Inpatient Acute Rehab Consult per OT change in recommendation.  At this time, we are recommending Inpatient Rehab consult. I pt and sister would like to consider an inpt rehab admission prior to return home with sister. Please place order. Cleatrice Burke 12/20/2015, 12:46 PM  I can be reached at [phone].

## 2015-12-20 NOTE — Progress Notes (Signed)
Physical Therapy Treatment Patient Details Name: [name] MRN: [id number] DOB: 05/19/1946 Today's Date: 12/20/2015    History of Present Illness Patient is a 70 y/o female with hx of fibromyalgia, HTN, HLD and depression presents with C 3-4, 5-6 ACDF and C3-6 laminectomy with lateral mass screws.    PT Comments    Patient progressing slowly towards PT goals. Requires assist of 2 for dynamic standing and taking a few steps to chair due to ataxia and BLE weakness. Difficulty progressing LLE due to weakness despite manual cues for weight shift. Continues to report pain in head is limiting mobility. Will continue to follow.   Follow Up Recommendations  SNF     Equipment Recommendations  None recommended by PT    Recommendations for Other Services       Precautions / Restrictions Precautions Precautions: Fall;Cervical Required Braces or Orthoses: Cervical Brace Cervical Brace: Hard collar Restrictions Weight Bearing Restrictions: No    Mobility  Bed Mobility Overal bed mobility: Needs Assistance Bed Mobility: Rolling;Sidelying to Sit Rolling: Min guard Sidelying to sit: Mod assist;HOB elevated       General bed mobility comments: Step by step cues for log roll technique. Very slow. Assist to elevate trunk. + dizziness.   Transfers Overall transfer level: Needs assistance Equipment used: Rolling walker (2 wheeled) Transfers: Sit to/from Stand Sit to Stand: Mod assist;+2 physical assistance         General transfer comment: Assist of 2 to stand from EOB with cues for hand placement and technique. Cues for hip extension and knee extension. Stood from [employer].  transferred to chair post ambulation bout.  Ambulation/Gait Ambulation/Gait assistance: Mod assist Ambulation Distance (Feet): 4 Feet Assistive device: Rolling walker (2 wheeled) Gait Pattern/deviations: Step-to pattern;Ataxic;Trunk flexed;Shuffle Gait velocity: decreased Gait velocity  interpretation: Below normal speed for age/gender General Gait Details: Able to take a few steps to chair with assist with weight shifting and for upright posture as pt with increased hip/knee flexion; Bil knee instability/weakness.    Stairs            Wheelchair Mobility    Modified Rankin (Stroke Patients Only)       Balance Overall balance assessment: Needs assistance Sitting-balance support: Feet supported;No upper extremity supported Sitting balance-Leahy Scale: Fair Sitting balance - Comments: Posterior bias and LOB during scooting bottom to EOB and BLE AROM. Min A for balance at times esp when donning gown.   Standing balance support: During functional activity;Bilateral upper extremity supported Standing balance-Leahy Scale: Zero Standing balance comment: Requires BUE on RW for support and Mod -mAx A for dynamic standing balance. Performed mini marches with emphasis on weight shifting, difficulty clearing LLE.                    Cognition Arousal/Alertness: Awake/alert Behavior During Therapy: WFL for tasks assessed/performed Overall Cognitive Status: Within Functional Limits for tasks assessed                      Exercises General Exercises - Lower Extremity Long Arc Quad: Both;10 reps;Seated    General Comments General comments (skin integrity, edema, etc.): Sister present in room during session.      Pertinent Vitals/Pain Pain Assessment: 0-10 Faces Pain Scale: Hurts worst Pain Location: neck Pain Descriptors / Indicators: Sore;Grimacing;Guarding;Sharp Pain Intervention(s): Monitored during session;Patient requesting pain meds-RN notified;Limited activity within patient's tolerance;Repositioned    Home Living  Prior Function            PT Goals (current goals can now be found in the care plan section) Progress towards PT goals: Progressing toward goals    Frequency  Min 5X/week    PT Plan Current  plan remains appropriate    Co-evaluation             End of Session Equipment Utilized During Treatment: Cervical collar;Gait belt Activity Tolerance: Patient limited by pain Patient left: in chair;with call bell/phone within reach;with chair alarm set;with family/visitor present     Time: [id number] PT Time Calculation (min) (ACUTE ONLY): 20 min  Charges:  $Therapeutic Activity: 8-22 mins                    G Codes:      Aryaan Persichetti A Alicia Seib 12/20/2015, 8:55 AM Wray Kearns, PT, DPT [phone]

## 2015-12-20 NOTE — Progress Notes (Signed)
I will follow up in the morning to assist with plans for possible inpt rehab admission when mediclaly ready and if pt and her sister are in agreement. [id number]

## 2015-12-20 NOTE — Progress Notes (Addendum)
No issues overnight. Pt reports resolution of nausea and no further HA. Unchanged strength. Able to sit in chair today.  EXAM:  BP 100/58 mmHg  Pulse 86  Temp(Src) 98.4 F (36.9 C) (Oral)  Resp 20  SpO2 97%  LMP 03/31/2004  Awake, alert, oriented  Speech fluent, appropriate  CN grossly intact  Distal > proximal quadriparesis, LLE weaker than RLE  Wounds c/d/i  IMPRESSION:  70 y.o. female POD#2 s/p circumferential decompression C3-6, recovering slowly. Baseline quadriparesis  PLAN: - Cont PT/OT - Will consult PMR for possible candidacy for CIR v SNF - will start heparin SQ for DVT prophylaxis

## 2015-12-20 NOTE — Care Management Important Message (Signed)
Important Message  Patient Details  Name: [name] MRN: [id number] Date of Birth: Mar 10, 1946   Medicare Important Message Given:  Yes    Loann Quill 12/20/2015, 12:26 PM
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
## 2015-12-20 NOTE — Clinical Social Work Note (Addendum)
CSW spoke to patient and her family and they would like to go to [facility] if inpatient rehab does not accept patient.  CSW notified [facility] who said they can take her once she is medically ready for discharge and orders have been received if patient is not accepted into inpatient rehab.  Jones Broom. [place], MSW, [place] 12/20/2015 5:06 PM

## 2015-12-21 ENCOUNTER — Inpatient Hospital Stay (HOSPITAL_COMMUNITY)
Admission: RE | Admit: 2015-12-21 | Discharge: 2016-01-15 | DRG: 947 | Disposition: A | Discharge status: Skilled Nursing Facility | Payer: Medicare Other | Source: Intra-hospital | Attending: Physical Medicine & Rehabilitation | Admitting: Physical Medicine & Rehabilitation

## 2015-12-21 DIAGNOSIS — G8250 Quadriplegia, unspecified: Secondary | ICD-10-CM | POA: Diagnosis present

## 2015-12-21 DIAGNOSIS — R269 Unspecified abnormalities of gait and mobility: Secondary | ICD-10-CM | POA: Insufficient documentation

## 2015-12-21 DIAGNOSIS — R609 Edema, unspecified: Secondary | ICD-10-CM

## 2015-12-21 DIAGNOSIS — E785 Hyperlipidemia, unspecified: Secondary | ICD-10-CM | POA: Diagnosis present

## 2015-12-21 DIAGNOSIS — I10 Essential (primary) hypertension: Secondary | ICD-10-CM | POA: Diagnosis present

## 2015-12-21 DIAGNOSIS — N319 Neuromuscular dysfunction of bladder, unspecified: Secondary | ICD-10-CM | POA: Diagnosis present

## 2015-12-21 DIAGNOSIS — R531 Weakness: Secondary | ICD-10-CM | POA: Diagnosis present

## 2015-12-21 DIAGNOSIS — R059 Cough, unspecified: Secondary | ICD-10-CM

## 2015-12-21 DIAGNOSIS — G8929 Other chronic pain: Secondary | ICD-10-CM | POA: Diagnosis present

## 2015-12-21 DIAGNOSIS — B9620 Unspecified Escherichia coli [E. coli] as the cause of diseases classified elsewhere: Secondary | ICD-10-CM | POA: Diagnosis not present

## 2015-12-21 DIAGNOSIS — M549 Dorsalgia, unspecified: Secondary | ICD-10-CM

## 2015-12-21 DIAGNOSIS — K5901 Slow transit constipation: Secondary | ICD-10-CM | POA: Diagnosis not present

## 2015-12-21 DIAGNOSIS — N390 Urinary tract infection, site not specified: Secondary | ICD-10-CM | POA: Diagnosis not present

## 2015-12-21 DIAGNOSIS — G8254 Quadriplegia, C5-C7 incomplete: Secondary | ICD-10-CM

## 2015-12-21 DIAGNOSIS — E8809 Other disorders of plasma-protein metabolism, not elsewhere classified: Secondary | ICD-10-CM | POA: Diagnosis present

## 2015-12-21 DIAGNOSIS — G959 Disease of spinal cord, unspecified: Secondary | ICD-10-CM | POA: Diagnosis present

## 2015-12-21 DIAGNOSIS — K5900 Constipation, unspecified: Secondary | ICD-10-CM | POA: Diagnosis present

## 2015-12-21 DIAGNOSIS — F411 Generalized anxiety disorder: Secondary | ICD-10-CM | POA: Diagnosis not present

## 2015-12-21 DIAGNOSIS — R05 Cough: Secondary | ICD-10-CM

## 2015-12-21 DIAGNOSIS — R350 Frequency of micturition: Secondary | ICD-10-CM | POA: Diagnosis present

## 2015-12-21 DIAGNOSIS — G8252 Quadriplegia, C1-C4 incomplete: Secondary | ICD-10-CM

## 2015-12-21 DIAGNOSIS — M797 Fibromyalgia: Secondary | ICD-10-CM | POA: Diagnosis present

## 2015-12-21 LAB — CREATININE, SERUM
CREATININE: 1.25 mg/dL — AB (ref 0.44–1.00)
GFR, EST AFRICAN AMERICAN: 50 mL/min — AB (ref >60)
GFR, EST NON AFRICAN AMERICAN: 43 mL/min — AB (ref >60)

## 2015-12-21 LAB — CBC
HCT: 29.1 % — ABNORMAL LOW (ref 36.0–46.0)
Hemoglobin: 9.6 g/dL — ABNORMAL LOW (ref 12.0–15.0)
MCH: 30.8 pg (ref 26.0–34.0)
MCHC: 33 g/dL (ref 30.0–36.0)
MCV: 93.3 fL (ref 78.0–100.0)
Platelets: 126 10*3/uL — ABNORMAL LOW (ref 150–400)
RBC: 3.12 MIL/uL — ABNORMAL LOW (ref 3.87–5.11)
RDW: 12.6 % (ref 11.5–15.5)
WBC: 10.4 10*3/uL (ref 4.0–10.5)

## 2015-12-21 MED ORDER — PANTOPRAZOLE SODIUM 40 MG IV SOLR
40.0000 mg | Freq: Every day | INTRAVENOUS | Status: DC
  Administered 2015-12-21: 40 mg via INTRAVENOUS
  Filled 2015-12-21: qty 40

## 2015-12-21 MED ORDER — ACETAMINOPHEN 650 MG RE SUPP
650.0000 mg | RECTAL | Status: DC | PRN
Start: 2015-12-21 — End: 2016-01-07

## 2015-12-21 MED ORDER — SORBITOL 70 % SOLN: 30.0000 mL | Freq: Every day | Status: DC | PRN

## 2015-12-21 MED ORDER — HEPARIN SODIUM (PORCINE) 5000 UNIT/ML IJ SOLN
5000.0000 [IU] | Freq: Three times a day (TID) | INTRAMUSCULAR | Status: DC
Start: 2015-12-21 — End: 2015-12-21

## 2015-12-21 MED ORDER — ALPRAZOLAM 0.5 MG PO TABS
0.5000 mg | ORAL_TABLET | Freq: Two times a day (BID) | ORAL | Status: DC
  Administered 2015-12-21 – 2016-01-15 (×50): 0.5 mg via ORAL
  Filled 2015-12-21 (×50): qty 1

## 2015-12-21 MED ORDER — DOCUSATE SODIUM 100 MG PO CAPS
100.0000 mg | ORAL_CAPSULE | Freq: Two times a day (BID) | ORAL | Status: DC
  Administered 2015-12-21 – 2015-12-24 (×5): 100 mg via ORAL
  Filled 2015-12-21 (×8): qty 1

## 2015-12-21 MED ORDER — OXYCODONE-ACETAMINOPHEN 5-325 MG PO TABS: 1.0000 | ORAL_TABLET | ORAL | Status: DC | PRN

## 2015-12-21 MED ORDER — POLYETHYLENE GLYCOL 3350 17 G PO PACK
17.0000 g | PACK | Freq: Every day | ORAL | Status: DC | PRN
  Administered 2015-12-30: 17 g via ORAL
  Filled 2015-12-21: qty 1

## 2015-12-21 MED ORDER — OXYCODONE-ACETAMINOPHEN 5-325 MG PO TABS
1.0000 | ORAL_TABLET | ORAL | Status: DC | PRN
  Administered 2015-12-22 – 2015-12-23 (×4): 2 via ORAL
  Administered 2015-12-23 (×3): 1 via ORAL
  Administered 2015-12-24 – 2016-01-01 (×24): 2 via ORAL
  Filled 2015-12-21 (×6): qty 2
  Filled 2015-12-21: qty 1
  Filled 2015-12-21: qty 2
  Filled 2015-12-21: qty 1
  Filled 2015-12-21 (×5): qty 2
  Filled 2015-12-21: qty 1
  Filled 2015-12-21 (×2): qty 2
  Filled 2015-12-21: qty 1
  Filled 2015-12-21 (×12): qty 2
  Filled 2015-12-21: qty 1
  Filled 2015-12-21 (×3): qty 2

## 2015-12-21 MED ORDER — VITAMIN B-12 1000 MCG PO TABS
1000.0000 ug | ORAL_TABLET | Freq: Every day | ORAL | Status: DC
  Administered 2015-12-22 – 2016-01-15 (×25): 1000 ug via ORAL
  Filled 2015-12-21 (×26): qty 1

## 2015-12-21 MED ORDER — ONDANSETRON HCL 4 MG PO TABS: 4.0000 mg | ORAL_TABLET | Freq: Four times a day (QID) | ORAL | Status: DC | PRN

## 2015-12-21 MED ORDER — ACETAMINOPHEN 325 MG PO TABS
650.0000 mg | ORAL_TABLET | ORAL | Status: DC | PRN
  Filled 2015-12-21: qty 2

## 2015-12-21 MED ORDER — LISINOPRIL 20 MG PO TABS
20.0000 mg | ORAL_TABLET | Freq: Every day | ORAL | Status: DC
  Administered 2015-12-23 – 2016-01-15 (×22): 20 mg via ORAL
  Filled 2015-12-21 (×25): qty 1

## 2015-12-21 MED ORDER — PREDNISONE 5 MG PO TABS
10.0000 mg | ORAL_TABLET | Freq: Two times a day (BID) | ORAL | Status: DC
  Administered 2015-12-21 – 2016-01-15 (×50): 10 mg via ORAL
  Filled 2015-12-21 (×52): qty 2

## 2015-12-21 MED ORDER — MAGNESIUM OXIDE 400 (241.3 MG) MG PO TABS
400.0000 mg | ORAL_TABLET | Freq: Every day | ORAL | Status: DC
  Administered 2015-12-22 – 2016-01-15 (×25): 400 mg via ORAL
  Filled 2015-12-21 (×25): qty 1

## 2015-12-21 MED ORDER — BISACODYL 10 MG RE SUPP
10.0000 mg | Freq: Every day | RECTAL | Status: DC | PRN
  Administered 2015-12-27 – 2016-01-02 (×3): 10 mg via RECTAL
  Filled 2015-12-21 (×3): qty 1

## 2015-12-21 MED ORDER — ONDANSETRON HCL 4 MG/2ML IJ SOLN: 4.0000 mg | Freq: Four times a day (QID) | INTRAMUSCULAR | Status: DC | PRN

## 2015-12-21 MED ORDER — HEPARIN SODIUM (PORCINE) 5000 UNIT/ML IJ SOLN
5000.0000 [IU] | Freq: Three times a day (TID) | INTRAMUSCULAR | Status: DC
  Administered 2015-12-21 – 2015-12-26 (×15): 5000 [IU] via SUBCUTANEOUS
  Filled 2015-12-21 (×15): qty 1

## 2015-12-21 MED ORDER — OMEGA-3-ACID ETHYL ESTERS 1 G PO CAPS
1.0000 g | ORAL_CAPSULE | Freq: Every day | ORAL | Status: DC
  Administered 2015-12-23 – 2016-01-15 (×23): 1 g via ORAL
  Filled 2015-12-21 (×24): qty 1

## 2015-12-21 MED ORDER — TIZANIDINE HCL 2 MG PO TABS: 2.0000 mg | ORAL_TABLET | Freq: Every evening | ORAL | Status: DC | PRN

## 2015-12-21 MED ORDER — HYDROCHLOROTHIAZIDE 25 MG PO TABS
25.0000 mg | ORAL_TABLET | Freq: Every day | ORAL | Status: DC
  Administered 2015-12-23 – 2016-01-01 (×10): 25 mg via ORAL
  Filled 2015-12-21 (×11): qty 1

## 2015-12-21 MED ORDER — FAMOTIDINE 20 MG PO TABS
20.0000 mg | ORAL_TABLET | Freq: Every day | ORAL | Status: DC
  Administered 2015-12-22 – 2016-01-15 (×25): 20 mg via ORAL
  Filled 2015-12-21 (×25): qty 1

## 2015-12-21 MED ORDER — ASPIRIN 81 MG PO CHEW
81.0000 mg | CHEWABLE_TABLET | Freq: Every day | ORAL | Status: DC
  Administered 2015-12-21 – 2016-01-15 (×26): 81 mg via ORAL
  Filled 2015-12-21 (×26): qty 1

## 2015-12-21 NOTE — Progress Notes (Signed)
Occupational Therapy Treatment Patient Details Name: [name] MRN: [id number] DOB: [birth year] Today's Date: 12/21/2015    History of present illness Patient is a 70 y/o female with hx of fibromyalgia, HTN, HLD and depression presents with C 3-4, 5-6 ACDF and C3-6 laminectomy with lateral mass screws.   OT comments  Pt is going to CIR this day!   Follow Up Recommendations  CIR    Equipment Recommendations  None recommended by OT    Recommendations for Other Services      Precautions / Restrictions Precautions Precautions: Fall;Cervical Required Braces or Orthoses: Cervical Brace Cervical Brace: Hard collar Restrictions Weight Bearing Restrictions: No       Mobility Bed Mobility               General bed mobility comments: pt had just gotten in chair  Transfers                 General transfer comment: NT        ADL Overall ADL's : Needs assistance/impaired                                       General ADL Comments: Pt in chair.  OT session focused on BUE wrist and hand exercises in sitting.  Pt performed wrsit extension, as well as finger flexion and extension. Pt with increased movement on R than L.  Pts has movement in planes of motion but all movements weak and need increased time.                  Cognition   Behavior During Therapy: WFL for tasks assessed/performed Overall Cognitive Status: Within Functional Limits for tasks assessed                         Exercises General Exercises - Upper Extremity Elbow Flexion: AROM;Both;10 reps Elbow Extension: AROM;10 reps;Both Wrist Extension: AROM;Both;10 reps Digit Composite Flexion: AROM;Both;10 reps Composite Extension: AROM;Both;10 reps      General Comments  Pt needed encouragement for all movements but did well and had good participation!  All movements very weak but pt able to perform with rest breaks and encouragement    Pertinent Vitals/  Pain       Pain Score: 4  Pain Location: neck Pain Descriptors / Indicators: Sore;Aching Pain Intervention(s): Monitored during session  Home Living   Living Arrangements: Other relatives (has lived with her sister, [name] and sister's husband since ) Available Help at Discharge: Family;Available 24 hours/day (sister does part time work Architect houses but will make hers)               Bathroom Shower/Tub: Corporate investment banker: Programmer, systems: Yes How Accessible: Accessible via walker        Lives With: Family    Prior Functioning/Environment              Frequency Min 3X/week     Progress Toward Goals  OT Goals(current goals can now be found in the care plan section)  Progress towards OT goals: Progressing toward goals     Plan Discharge plan remains appropriate    Co-evaluation                 End of Session     Activity Tolerance Patient tolerated treatment well   Patient Left in  chair;with call bell/phone within reach (OT hooked up touch pad call bell as pt not able to use regualr call bell)   Nurse Communication Other (comment) (call bell situation)        Time: [id number] OT Time Calculation (min): 11 min  Charges: OT General Charges $OT Visit: 1 Procedure OT Treatments $Therapeutic Exercise: 8-22 mins  [name] D 12/21/2015, 10:40 AM

## 2015-12-21 NOTE — Progress Notes (Signed)
I met with pt at bedside and then contacted her sister by phone to discuss an inpt rehab admission today. They are both in agreement and prefer CIR rather than SNF. I contacted Dr. Kathyrn Sheriff and he will prepare pt for d/c. I have alerted RN CM and SW and will make the arrangements to admit today. [phone]
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
## 2015-12-21 NOTE — Clinical Social Work Note (Signed)
CSW received referral for SNF.  CSW was informed by inpatient rehab admissions that they will accept patient today.  CSW to sign off please re-consult if social work needs arise.  Jones Broom. [place], MSW, [place]

## 2015-12-21 NOTE — Care Management Note (Signed)
Case Management Note  Patient Details  Name: LIRIO YEAGLE MRN: [id number] Date of Birth: [birth year]  Subjective/Objective:                    Action/Plan: Plan is to discharge to [facility]. No further needs per CM.   Expected Discharge Date:                  Expected Discharge Plan:  [place]  In-House Referral:     Discharge planning Services  CM Consult  Post Acute Care Choice:    Choice offered to:     DME Arranged:    DME Agency:     HH Arranged:    [place] Agency:     Status of Service:  Completed, signed off  Medicare Important Message Given:  Yes Date Medicare IM Given:    Medicare IM give by:    Date Additional Medicare IM Given:    Additional Medicare Important Message give by:     If discussed at [place] of Stay Meetings, dates discussed:    Additional Comments:  Pollie Friar, RN 12/21/2015, 10:24 AM
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
## 2015-12-21 NOTE — Discharge Summary (Signed)
  Physician Discharge Summary  Patient ID: [name] MRN: [id number] DOB/AGE: July 29, 1945 70 y.o.  Admit date: 12/18/2015 Discharge date: 12/21/2015  Admission Diagnoses: Cervical spondylosis with myelopathy  Discharge Diagnoses: Same Active Problems:   Cervical spondylosis with myelopathy   Discharged Condition: Stable  Hospital Course:  Mrs. [name] is a 70 y.o. female seen recently with moderate quadriparesis. She had multilevel cervical spondylosis with cord compression on MRI. She was electively admitted for circumferential decompression/fusion. She was at baseline postop with stable quadriparesis. She was tolerating diet well, was evaluated by PT/OT and PMR and found to be a candidate for CIR. She was therefore discharged in stable condition.  Treatments: Surgery - C3-4, C4-5 ACDF, C3-6 laminectomy, lateral mass fusion  Discharge Exam: Blood pressure 100/51, pulse 89, temperature 98.6 F (37 C), temperature source Oral, resp. rate 20, last menstrual period 03/31/2004, SpO2 95 %. Awake, alert, oriented Speech fluent, appropriate CN grossly intact 4-/5 proximal BUE, 2/5 distal 4-/5 proximal RLE, 3/5 proximal LLE, 2/5 distal Wounds c/d/i  Disposition: CIR     Medication List    TAKE these medications        ALPRAZolam 0.5 MG tablet  Commonly known as:  XANAX  Take 1 tablet (0.5 mg total) by mouth 3 (three) times daily as needed for anxiety.     aspirin EC 81 MG tablet  Take 1 tablet (81 mg total) by mouth daily.     fish oil-omega-3 fatty acids 1000 MG capsule  Take 1 g by mouth daily.     gabapentin 100 MG capsule  Commonly known as:  NEURONTIN  Take 1 capsule (100 mg total) by mouth 3 (three) times daily.     hydrochlorothiazide 25 MG tablet  Commonly known as:  HYDRODIURIL  Take 1 tablet (25 mg total) by mouth daily.     ibuprofen 200 MG tablet  Commonly known as:  ADVIL,MOTRIN  Take 400 mg by mouth every 6 (six) hours as  needed for moderate pain.     lisinopril 20 MG tablet  Commonly known as:  PRINIVIL,ZESTRIL  Take 1 tablet (20 mg total) by mouth daily.     magnesium oxide 400 MG tablet  Commonly known as:  MAG-OX  Take 400 mg by mouth daily.     oxyCODONE-acetaminophen 5-325 MG tablet  Commonly known as:  PERCOCET/ROXICET  Take 1-2 tablets by mouth every 4 (four) hours as needed for moderate pain.     predniSONE 10 MG tablet  Commonly known as:  DELTASONE  Take 1 tablet (10 mg total) by mouth 2 (two) times daily with a meal.     ranitidine 150 MG tablet  Commonly known as:  ZANTAC  Take 150 mg by mouth daily.     tizanidine 2 MG capsule  Commonly known as:  ZANAFLEX  Take 1 capsule (2 mg total) by mouth at bedtime as needed for muscle spasms.     VITAMIN B-12 PO  Take 1,200 mg by mouth daily.           Follow-up Information    Follow up with [facility], Danae Oland, C, MD In 3 weeks.   Specialty:  Neurosurgery   Contact information:   [address] [place] [address] [phone]       Signed: Consuella Lose, Loletha Grayer 12/21/2015, 10:56 AM
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
## 2015-12-21 NOTE — Progress Notes (Addendum)
Physical Therapy Treatment Patient Details Name: [name] MRN: [id number] DOB: 1946-05-03 Today's Date: 12/21/2015    History of Present Illness Patient is a 70 y/o female with hx of fibromyalgia, HTN, HLD and depression presents with C 3-4, 5-6 ACDF and C3-6 laminectomy with lateral mass screws.    PT Comments    Patient currently mod/max A +2 for OOB mobility. Pt unable to take steps this session and with decreased ability to grasp RW. Continue to progress as tolerated.   Follow Up Recommendations  SNF     Equipment Recommendations  None recommended by PT    Recommendations for Other Services       Precautions / Restrictions Precautions Precautions: Fall;Cervical Required Braces or Orthoses: Cervical Brace Cervical Brace: Hard collar Restrictions Weight Bearing Restrictions: No    Mobility  Bed Mobility Overal bed mobility: Needs Assistance Bed Mobility: Rolling;Sidelying to Sit Rolling: Min guard Sidelying to sit: Mod assist;HOB elevated       General bed mobility comments: cues for sequencing and technique and assist to elevate trunk into sitting with use of bed rail  Transfers Overall transfer level: Needs assistance Equipment used: Rolling walker (2 wheeled) Transfers: Sit to/from [employer] Sit to Stand: Mod assist;+2 physical assistance   Squat pivot transfers: Max assist;+2 safety/equipment;+2 physical assistance     General transfer comment: assist to power up into standing from EOB and to maintain balance with use of RW; pt able to stand for < 30 secs and began sitting down on bed without warning; second attempt with no RW and 2 person assist with knees blocked for squat pivot to chair as pt was unable to achieve erect posture or take steps  Ambulation/Gait                 Stairs            Wheelchair Mobility    Modified Rankin (Stroke Patients Only)       Balance Overall balance assessment: Needs  assistance Sitting-balance support: Feet supported;Single extremity supported Sitting balance-Leahy Scale: Poor Sitting balance - Comments: needed assist to maintain midline sitting for brief periods of time with pt leaning laterally to both sides and posteriorly                            Cognition Arousal/Alertness: Awake/alert Behavior During Therapy: WFL for tasks assessed/performed Overall Cognitive Status: Within Functional Limits for tasks assessed                      Exercises General Exercises - Upper Extremity Elbow Flexion: AROM;Both;10 reps Elbow Extension: AROM;10 reps;Both Wrist Extension: AROM;Both;10 reps Digit Composite Flexion: AROM;Both;10 reps Composite Extension: AROM;Both;10 reps    General Comments        Pertinent Vitals/Pain Pain Assessment: Faces Pain Score: 4  Faces Pain Scale: Hurts even more Pain Location: neck with transitional movements Pain Descriptors / Indicators: Aching;Grimacing;Guarding;Sore Pain Intervention(s): Limited activity within patient's tolerance;Monitored during session;Repositioned    Home Living   Living Arrangements: Other relatives (has lived with her sister, [name] and sister's husband since ) Available Help at Discharge: Family;Available 24 hours/day (sister does part time work [employer] but will make hers)                Prior Function            PT Goals (current goals can now be found in the care  plan section) Acute Rehab PT Goals Patient Stated Goal: be more independent Progress towards PT goals: Progressing toward goals    Frequency  Min 5X/week    PT Plan Current plan remains appropriate    Co-evaluation             End of Session Equipment Utilized During Treatment: Cervical collar;Gait belt Activity Tolerance: Patient limited by fatigue Patient left: in chair;with call bell/phone within reach;with chair alarm set     Time: [phone] PT Time Calculation (min)  (ACUTE ONLY): 17 min  Charges:  $Therapeutic Activity: 8-22 mins                    G Codes:      Salina April, PTA Pager: [phone]   12/21/2015, 11:38 AM

## 2015-12-21 NOTE — Progress Notes (Signed)
Patient ID: [name], female   DOB: 17-May-1946, 70 y.o.   MRN: [id number] Patient admitted to 4W07 via bed, escorted by nursing staff.  Patient verbalized understanding of rehab process, signed fall safety agreement.  Appears to be in no immediate distress at this time aside from bladder being distended.  Bladder scanned for 874 ml.  Will straight catheterize patient to empty bladder.  Brita Romp, RN

## 2015-12-21 NOTE — Interval H&P Note (Signed)
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  The patient's history has been reviewed, patient examined, and there is no change in status.  Patient continues to be appropriate for intensive inpatient rehabilitation.  I have reviewed the patient's chart and labs.  Questions were answered to the patient's satisfaction. The PAPE has been reviewed and assessment remains appropriate.  [name] 12/21/2015, 4:39 PM
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
## 2015-12-22 ENCOUNTER — Inpatient Hospital Stay (HOSPITAL_COMMUNITY): Payer: Medicare Other | Admitting: Speech Pathology

## 2015-12-22 ENCOUNTER — Inpatient Hospital Stay (HOSPITAL_COMMUNITY): Payer: Medicare Other | Admitting: Physical Therapy

## 2015-12-22 ENCOUNTER — Inpatient Hospital Stay (HOSPITAL_COMMUNITY): Payer: Medicare Other | Admitting: Occupational Therapy

## 2015-12-22 DIAGNOSIS — N319 Neuromuscular dysfunction of bladder, unspecified: Secondary | ICD-10-CM

## 2015-12-22 MED ORDER — PANTOPRAZOLE SODIUM 40 MG PO TBEC
40.0000 mg | DELAYED_RELEASE_TABLET | Freq: Every day | ORAL | Status: DC
  Administered 2015-12-23 – 2016-01-14 (×23): 40 mg via ORAL
  Filled 2015-12-22 (×23): qty 1

## 2015-12-22 MED ORDER — PREGABALIN 25 MG PO CAPS
25.0000 mg | ORAL_CAPSULE | Freq: Two times a day (BID) | ORAL | Status: DC
  Administered 2015-12-22 – 2015-12-24 (×5): 25 mg via ORAL
  Filled 2015-12-22 (×5): qty 1
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
## 2015-12-22 NOTE — Progress Notes (Signed)
70 y.o. right handed female with history of fibromyalgia, hypertension. Patient lives with sister. One level home with 3 steps to entry. Presented 12/18/2015 for numbness and weakness involving both hands and lower extremities that has progressed over the past 6-8 months. She had also been experiencing increased neck pain. Denied any bowel or bladder disturbances. She had been using a cane and later in need of a walker. X-rays and imaging revealed cervical spondylosis with follow-up with a C3-C6. Underwent two-stage approach discectomy at C3-4, C4-5 with decompression, placement of intravertebral biomechanical device, [employer] with C3-C6 laminectomy for decompression of spinal cord posterior segmental instrumentation 12/18/2015 per Dr. Kathyrn Sheriff. Hospital course pain management. Hard cervical collar at all times  Subjective/Complaints: Neck pain as well as upper and lower extremity limb pain. The limb pain is more of a burning discomfort.the neck pain is mainly posterior. She received some pain medications this morning.  rreviewed allergies, allergic to gabapentin  Review of systems denies any breathing problems no chest pain no shortness of breath, positive nausea,, negative vomiting negative abdominal pain.  Objective: Vital Signs: Blood pressure 134/70, pulse 100, temperature 97.8 F (36.6 C), temperature source Oral, resp. rate 18, height 5' 9" (1.753 m), last menstrual period 03/31/2004, SpO2 97 %. No results found. Results for orders placed or performed during the hospital encounter of 12/21/15 (from the past 72 hour(s))  CBC     Status: Abnormal   Collection Time: 12/21/15  2:44 PM  Result Value Ref Range   WBC 10.4 4.0 - 10.5 K/uL   RBC 3.12 (L) 3.87 - 5.11 MIL/uL   Hemoglobin 9.6 (L) 12.0 - 15.0 g/dL   HCT 29.1 (L) 36.0 - 46.0 %   MCV 93.3 78.0 - 100.0 fL   MCH 30.8 26.0 - 34.0 pg   MCHC 33.0 30.0 - 36.0 g/dL   RDW 12.6 11.5 - 15.5 %   Platelets 126 (L) 150 - 400 K/uL   Creatinine, serum     Status: Abnormal   Collection Time: 12/21/15  2:44 PM  Result Value Ref Range   Creatinine, Ser 1.25 (H) 0.44 - 1.00 mg/dL   GFR calc non Af Amer 43 (L) >60 mL/min   GFR calc Af Amer 50 (L) >60 mL/min    Comment: (NOTE) The eGFR has been calculated using the CKD EPI equation. This calculation has not been validated in all clinical situations. eGFR's persistently <60 mL/min signify possible Chronic Kidney Disease.      General: No acute distress Mood and affect are flat Heart: Regular rate and rhythm no rubs murmurs or extra sounds Lungs: Clear to auscultation, breathing unlabored, no rales or wheezes Abdomen: Positive bowel sounds, soft nontender to palpation, nondistended Extremities: No clubbing, cyanosis, or edema Skin: No evidence of breakdown, no evidence of rash Neurologic: Cranial nerves II through XII intact, motor strength is 3-/5 in bilateral deltoid, bicep,trace  tricep,  2- grip, 0/5 hip flexor, knee extensors, ankle dorsiflexor and plantar flexor, trace right toe flexion Sensory exam absent in bilateral lower extremities to light touch  Musculoskeletal: Full passive  range of motion in all 4 extremities. No joint swelling   Assessment/Plan: 1. 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 Physiatrist is providing close team supervision and 24 hour management of active medical problems listed below. Physiatrist and rehab team continue to assess barriers to discharge/monitor patient progress toward functional and medical goals. FIM:  Function - Comprehension Comprehension: Auditory Comprehension assist level: Understands basic 90% of the time/cues < 10% of the time  Function - Expression Expression: Verbal Expression assist level: Expresses basic 90% of the time/requires cueing < 10% of  the time.  Function - Social Interaction Social Interaction assist level: Interacts appropriately 90% of the time - Needs monitoring or encouragement for participation or interaction.  Function - Problem Solving Problem solving assist level: Solves basic 90% of the time/requires cueing < 10% of the time  Function - Memory Memory assist level: Recognizes or recalls 90% of the time/requires cueing < 10% of the time Patient normally able to recall (first 3 days only): Current season, Location of own room, Staff names and faces, That he or she is in a hospital  Medical Problem List and Plan: 1.  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 Cervical collar at all times Initiate CIR PT and OT 2.  DVT Prophylaxis/Anticoagulation: Subcutaneous heparin. Monitor platelet counts of any signs of bleeding 3. Pain Management/fibromyalgia/chronic back pain: Chronic prednisone 10 mg twice a day,  Oxycodone and Zanaflex as needed, has neurogenic pain will start Lyrica 4. Mood: Xanax 0.5 mg twice a day 5. Neuropsych: This patient is capable of making decisions on her own behalf. 6. Skin/Wound Care: Routine skin checks 7. Fluids/Electrolytes/Nutrition: Routine I&O's with follow-up chemistries 8. Hypertension. Hydrochlorothiazide 25 mg daily, lisinopril 20 mg daily. Monitor with increased mobility, may need to reduce to avoid orthostasis Filed Vitals:   12/22/15 0518 12/22/15 0935  BP: 134/70 92/52  Pulse: 100   Temp: 97.8 F (36.6 C)   Resp: 18    9. Constipation. Laxative assistance 10. Hyperlipidemia.Lovaza 11. Neurogenic bladder continue ICP program   LOS (Days) 1 A FACE TO FACE EVALUATION WAS PERFORMED  Oaklen Thiam E 12/22/2015, 8:41 AM

## 2015-12-23 ENCOUNTER — Inpatient Hospital Stay (HOSPITAL_COMMUNITY): Payer: Medicare Other | Admitting: Physical Therapy

## 2015-12-23 NOTE — Progress Notes (Signed)
Physical Therapy Session Note  Patient Details  Name: [name] MRN: [id number] Date of Birth: 1946-01-19  Today's Date: 12/23/2015 PT Individual Time: 1100-1200 PT Individual Time Calculation (min): 60 min   Short Term Goals: Week 1:  PT Short Term Goal 1 (Week 1): Pt will increase rolling with side rails to mod A.  PT Short Term Goal 2 (Week 1): Pt willl increase transfers supine to edge of bed, edge of bed to supine to max A.  PT Short Term Goal 3 (Week 1): Pt will increase transfers bed to chair to max A.  PT Short Term Goal 4 (Week 1): Pt will increase ambulation with appropriate assistive device to max A about 10 feet.  PT Short Term Goal 5 (Week 1): Pt will propel w/c about 50 feet with min A.   Skilled Therapeutic Interventions/Progress Updates:  Pt was transported to rehab gym. Pt transferred sit to stand in parallel bars with total A. Pt transferred w/c to edge of mat with total A. Pt transferred edge of mat to supine with total A. Pt performed LE exercises on mat, including heel slides, hip abd/add, hip IR/ER, and SAQs, 3 sets x 5 reps each, AAROM R LE and PROM to AAROM L LE. Pt transferred supine to edge of mat with total A. Pt transferred edge of mat to w/c with squat pivot total A. Pt returned to room and left sitting with soft touch call bell within reach.   Therapy Documentation Precautions:  Precautions Precautions: Fall, Cervical Required Braces or Orthoses: Cervical Brace Cervical Brace: Hard collar, At all times Restrictions Weight Bearing Restrictions: No General:   Vital Signs:  Pain: Pt c/s +10 cervical/neck pain, medicated prior to treatment.  See Function Navigator for Current Functional Status.   Therapy/Group: Individual Therapy  Dub Amis 12/23/2015, 12:28 PM

## 2015-12-23 NOTE — Progress Notes (Signed)
Subjective/Complaints: Less pain, has questions about why she is weak  rreviewed allergies, allergic to gabapentin  Review of systems denies any breathing problems no chest pain no shortness of breath, positive nausea,, negative vomiting negative abdominal pain.  Objective: Vital Signs: Blood pressure 144/81, pulse 82, temperature 98.2 F (36.8 C), temperature source Oral, resp. rate 17, height '5\' 9"'  (1.753 m), last menstrual period 03/31/2004, SpO2 93 %. No results found. Results for orders placed or performed during the hospital encounter of 12/21/15 (from the past 72 hour(s))  CBC     Status: Abnormal   Collection Time: 12/21/15  2:44 PM  Result Value Ref Range   WBC 10.4 4.0 - 10.5 K/uL   RBC 3.12 (L) 3.87 - 5.11 MIL/uL   Hemoglobin 9.6 (L) 12.0 - 15.0 g/dL   HCT 29.1 (L) 36.0 - 46.0 %   MCV 93.3 78.0 - 100.0 fL   MCH 30.8 26.0 - 34.0 pg   MCHC 33.0 30.0 - 36.0 g/dL   RDW 12.6 11.5 - 15.5 %   Platelets 126 (L) 150 - 400 K/uL  Creatinine, serum     Status: Abnormal   Collection Time: 12/21/15  2:44 PM  Result Value Ref Range   Creatinine, Ser 1.25 (H) 0.44 - 1.00 mg/dL   GFR calc non Af Amer 43 (L) >60 mL/min   GFR calc Af Amer 50 (L) >60 mL/min    Comment: (NOTE) The eGFR has been calculated using the CKD EPI equation. This calculation has not been validated in all clinical situations. eGFR's persistently <60 mL/min signify possible Chronic Kidney Disease.      General: No acute distress Mood and affect are flat Heart: Regular rate and rhythm no rubs murmurs or extra sounds Lungs: Clear to auscultation, breathing unlabored, no rales or wheezes Abdomen: Positive bowel sounds, soft nontender to palpation, nondistended Extremities: No clubbing, cyanosis, or edema Skin: No evidence of breakdown, no evidence of rash Neurologic: Cranial nerves II through XII intact, motor strength is 3-/5 in bilateral deltoid, bicep,trace  tricep,  2- grip, 0/5 hip flexor, knee  extensors, ankle dorsiflexor and plantar flexor, trace right toe flexion Sensory exam absent in bilateral lower extremities to light touch  Musculoskeletal: Full passive  range of motion in all 4 extremities. No joint swelling   Assessment/Plan: 1. 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 Physiatrist is providing close team supervision and 24 hour management of active medical problems listed below. Physiatrist and rehab team continue to assess barriers to discharge/monitor patient progress toward functional and medical goals. FIM: Function - Bathing Position: Bed Body parts bathed by helper: Right arm, Left arm, Chest, Abdomen, Front perineal area, Buttocks, Right upper leg, Left upper leg, Right lower leg, Left lower leg, Back  Function- Upper Body Dressing/Undressing What is the patient wearing?: Pull over shirt/dress Pull over shirt/dress - Perfomed by helper: Thread/unthread right sleeve, Thread/unthread left sleeve, Put head through opening, Pull shirt over trunk Function - Lower Body Dressing/Undressing What is the patient wearing?: Pants, Ted Hose, Non-skid slipper socks Position: Bed Pants- Performed by helper: Thread/unthread right pants leg, Thread/unthread left pants leg, Pull pants up/down TED Hose - Performed by helper: Don/doff right TED hose, Don/doff left TED hose Assist for footwear: Dependant  Function - Toileting Toileting activity did not occur: No continent bowel/bladder event  Function - Air cabin crew transfer activity did not occur: Safety/medical concerns  Function - Chair/bed transfer Chair/bed transfer method: Lateral scoot Chair/bed transfer assist level: 2 helpers Chair/bed transfer assistive device: Sliding board Chair/bed transfer details: Manual facilitation for placement, Manual facilitation for weight  shifting, Manual facilitation for weight bearing, Verbal cues for safe use of DME/AE, Verbal cues for precautions/safety, Verbal cues for technique  Function - Locomotion: Wheelchair Will patient use wheelchair at discharge?: Yes Type: Manual Wheelchair activity did not occur: Safety/medical concerns Wheel 50 feet with 2 turns activity did not occur: Safety/medical concerns Wheel 150 feet activity did not occur: Safety/medical concerns Function - Locomotion: Ambulation Ambulation activity did not occur: Safety/medical concerns Walk 10 feet activity did not occur: Safety/medical concerns Walk 50 feet with 2 turns activity did not occur: Safety/medical concerns Walk 150 feet activity did not occur: Safety/medical concerns Walk 10 feet on uneven surfaces activity did not occur: Safety/medical concerns  Function - Comprehension Comprehension: Auditory Comprehension assist level: Understands basic 90% of the time/cues < 10% of the time  Function - Expression Expression: Verbal Expression assist level: Expresses basic 50 - 74% of the time/requires cueing 25 - 49% of the time. Needs to repeat parts of sentences.  Function - Social Interaction Social Interaction assist level: Interacts appropriately 90% of the time - Needs monitoring or encouragement for participation or interaction.  Function - Problem Solving Problem solving assist level: Solves basic 25 - 49% of the time - needs direction more than half the time to initiate, plan or complete simple activities  Function - Memory Memory assist level: Recognizes or recalls 25 - 49% of the time/requires cueing 50 - 75% of the time Patient normally able to recall (first 3 days only): Current season, Location of own room, Staff names and faces, That he or she is in a hospital  Medical Problem List and Plan: 1.  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 Cervical collar at all times Cont CIR PT and OT Pt with hyperactive reflexes but no severe spasticity at this point 2.  DVT Prophylaxis/Anticoagulation: Subcutaneous heparin. Monitor platelet counts of any signs of bleeding 3. Pain Management/fibromyalgia/chronic back pain: Chronic prednisone 10 mg twice a day,  Oxycodone and Zanaflex as needed, has neurogenic pain now on Lyrica 54m BID, Allergy to gabapentin 4. Mood: Xanax 0.5 mg twice a day 5. Neuropsych: This patient is capable of making decisions on her own behalf. 6. Skin/Wound Care: Routine skin checks 7. Fluids/Electrolytes/Nutrition: Routine I&O's with follow-up chemistries Meal intake 50-100% 8. Hypertension. Hydrochlorothiazide 25 mg daily, lisinopril 20 mg daily. Monitor with increased mobility,  Filed Vitals:   12/22/15 1504 12/23/15 0520  BP: 109/69 144/81  Pulse: 97 82  Temp: 97.4 F (36.3 C) 98.2 F (36.8 C)  Resp: 17 17   9. Constipation. Laxative assistance 10. Hyperlipidemia.Lovaza 11. Neurogenic bladder continue ICP program   LOS (Days) 2 A FACE TO FACE EVALUATION WAS PERFORMED  Garfield Coiner E 12/23/2015, 8:36 AM

## 2015-12-24 ENCOUNTER — Inpatient Hospital Stay (HOSPITAL_COMMUNITY): Payer: Medicare Other | Admitting: Occupational Therapy

## 2015-12-24 ENCOUNTER — Inpatient Hospital Stay (HOSPITAL_COMMUNITY): Payer: Medicare Other | Admitting: Physical Therapy

## 2015-12-24 ENCOUNTER — Inpatient Hospital Stay (HOSPITAL_COMMUNITY): Payer: Medicare Other | Admitting: Speech Pathology

## 2015-12-24 LAB — CBC WITH DIFFERENTIAL/PLATELET
BASOS ABS: 0 10*3/uL (ref 0.0–0.1)
Basophils Relative: 0 %
EOS PCT: 0 %
Eosinophils Absolute: 0 10*3/uL (ref 0.0–0.7)
HEMATOCRIT: 26.9 % — AB (ref 36.0–46.0)
Hemoglobin: 8.9 g/dL — ABNORMAL LOW (ref 12.0–15.0)
LYMPHS ABS: 0.7 10*3/uL (ref 0.7–4.0)
LYMPHS PCT: 10 %
MCH: 30.8 pg (ref 26.0–34.0)
MCHC: 33.1 g/dL (ref 30.0–36.0)
MCV: 93.1 fL (ref 78.0–100.0)
MONO ABS: 0.5 10*3/uL (ref 0.1–1.0)
MONOS PCT: 7 %
NEUTROS ABS: 6.2 10*3/uL (ref 1.7–7.7)
Neutrophils Relative %: 83 %
Platelets: 171 10*3/uL (ref 150–400)
RBC: 2.89 MIL/uL — ABNORMAL LOW (ref 3.87–5.11)
RDW: 12.2 % (ref 11.5–15.5)
WBC: 7.4 10*3/uL (ref 4.0–10.5)

## 2015-12-24 LAB — COMPREHENSIVE METABOLIC PANEL
ALT: 33 U/L (ref 14–54)
AST: 27 U/L (ref 15–41)
Albumin: 2.5 g/dL — ABNORMAL LOW (ref 3.5–5.0)
Alkaline Phosphatase: 162 U/L — ABNORMAL HIGH (ref 38–126)
Anion gap: 4 — ABNORMAL LOW (ref 5–15)
BILIRUBIN TOTAL: 0.8 mg/dL (ref 0.3–1.2)
BUN: 17 mg/dL (ref 6–20)
CO2: 32 mmol/L (ref 22–32)
CREATININE: 0.53 mg/dL (ref 0.44–1.00)
Calcium: 8.8 mg/dL — ABNORMAL LOW (ref 8.9–10.3)
Chloride: 99 mmol/L — ABNORMAL LOW (ref 101–111)
Glucose, Bld: 96 mg/dL (ref 65–99)
POTASSIUM: 4.3 mmol/L (ref 3.5–5.1)
Sodium: 135 mmol/L (ref 135–145)
TOTAL PROTEIN: 5.5 g/dL — AB (ref 6.5–8.1)

## 2015-12-24 MED ORDER — PREGABALIN 50 MG PO CAPS
50.0000 mg | ORAL_CAPSULE | Freq: Two times a day (BID) | ORAL | Status: DC
  Administered 2015-12-24 – 2015-12-25 (×3): 50 mg via ORAL
  Filled 2015-12-24 (×3): qty 1

## 2015-12-24 NOTE — Progress Notes (Signed)
Subjective/Complaints: Patient still has pain posterior neck as well as bilateral arms and legs. The arm and leg pain is described as burning. Her neck pain radiates toward her head.  rreviewed allergies, allergic to gabapentin  Review of systems denies any breathing problems no chest pain no shortness of breath, positive nausea,, negative vomiting negative abdominal pain.  Objective: Vital Signs: Blood pressure 189/95, pulse 78, temperature 97.9 F (36.6 C), temperature source Oral, resp. rate 17, height '5\' 9"'  (1.753 m), last menstrual period 03/31/2004, SpO2 97 %. No results found. Results for orders placed or performed during the hospital encounter of 12/21/15 (from the past 72 hour(s))  CBC     Status: Abnormal   Collection Time: 12/21/15  2:44 PM  Result Value Ref Range   WBC 10.4 4.0 - 10.5 K/uL   RBC 3.12 (L) 3.87 - 5.11 MIL/uL   Hemoglobin 9.6 (L) 12.0 - 15.0 g/dL   HCT 29.1 (L) 36.0 - 46.0 %   MCV 93.3 78.0 - 100.0 fL   MCH 30.8 26.0 - 34.0 pg   MCHC 33.0 30.0 - 36.0 g/dL   RDW 12.6 11.5 - 15.5 %   Platelets 126 (L) 150 - 400 K/uL  Creatinine, serum     Status: Abnormal   Collection Time: 12/21/15  2:44 PM  Result Value Ref Range   Creatinine, Ser 1.25 (H) 0.44 - 1.00 mg/dL   GFR calc non Af Amer 43 (L) >60 mL/min   GFR calc Af Amer 50 (L) >60 mL/min    Comment: (NOTE) The eGFR has been calculated using the CKD EPI equation. This calculation has not been validated in all clinical situations. eGFR's persistently <60 mL/min signify possible Chronic Kidney Disease.   CBC WITH DIFFERENTIAL     Status: Abnormal   Collection Time: 12/24/15  4:55 AM  Result Value Ref Range   WBC 7.4 4.0 - 10.5 K/uL   RBC 2.89 (L) 3.87 - 5.11 MIL/uL   Hemoglobin 8.9 (L) 12.0 - 15.0 g/dL   HCT 26.9 (L) 36.0 - 46.0 %   MCV 93.1 78.0 - 100.0 fL   MCH 30.8 26.0 - 34.0 pg   MCHC 33.1 30.0 - 36.0 g/dL   RDW 12.2 11.5 - 15.5 %   Platelets 171 150 - 400 K/uL   Neutrophils Relative % 83 %    Neutro Abs 6.2 1.7 - 7.7 K/uL   Lymphocytes Relative 10 %   Lymphs Abs 0.7 0.7 - 4.0 K/uL   Monocytes Relative 7 %   Monocytes Absolute 0.5 0.1 - 1.0 K/uL   Eosinophils Relative 0 %   Eosinophils Absolute 0.0 0.0 - 0.7 K/uL   Basophils Relative 0 %   Basophils Absolute 0.0 0.0 - 0.1 K/uL  Comprehensive metabolic panel     Status: Abnormal   Collection Time: 12/24/15  4:55 AM  Result Value Ref Range   Sodium 135 135 - 145 mmol/L   Potassium 4.3 3.5 - 5.1 mmol/L   Chloride 99 (L) 101 - 111 mmol/L   CO2 32 22 - 32 mmol/L   Glucose, Bld 96 65 - 99 mg/dL   BUN 17 6 - 20 mg/dL   Creatinine, Ser 0.53 0.44 - 1.00 mg/dL   Calcium 8.8 (L) 8.9 - 10.3 mg/dL   Total Protein 5.5 (L) 6.5 - 8.1 g/dL   Albumin 2.5 (L) 3.5 - 5.0 g/dL   AST 27 15 - 41 U/L   ALT 33 14 - 54 U/L   Alkaline  Phosphatase 162 (H) 38 - 126 U/L   Total Bilirubin 0.8 0.3 - 1.2 mg/dL   GFR calc non Af Amer >60 >60 mL/min   GFR calc Af Amer >60 >60 mL/min    Comment: (NOTE) The eGFR has been calculated using the CKD EPI equation. This calculation has not been validated in all clinical situations. eGFR's persistently <60 mL/min signify possible Chronic Kidney Disease.    Anion gap 4 (L) 5 - 15     General: No acute distress Mood and affect are flat Heart: Regular rate and rhythm no rubs murmurs or extra sounds Lungs: Clear to auscultation, breathing unlabored, no rales or wheezes Abdomen: Positive bowel sounds, soft nontender to palpation, nondistended Extremities: No clubbing, cyanosis, or edema Skin: No evidence of breakdown, no evidence of rash Neurologic: Cranial nerves II through XII intact, motor strength is 3-/5 in bilateral deltoid, bicep,trace  tricep,  2- grip, 0/5 hip flexor, knee extensors, ankle dorsiflexor and plantar flexor, trace right toe flexion Sensory exam absent in bilateral lower extremities to light touch  Musculoskeletal: Full passive  range of motion in all 4 extremities. No joint  swelling   Assessment/Plan: 1. 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 Physiatrist is providing close team supervision and 24 hour management of active medical problems listed below. Physiatrist and rehab team continue to assess barriers to discharge/monitor patient progress toward functional and medical goals. FIM: Function - Bathing Position: Bed Body parts bathed by helper: Right arm, Left arm, Chest, Abdomen, Front perineal area, Buttocks, Right upper leg, Left upper leg, Right lower leg, Left lower leg, Back Assist Level: Touching or steadying assistance(Pt > 75%)  Function- Upper Body Dressing/Undressing What is the patient wearing?: Hospital gown Pull over shirt/dress - Perfomed by helper: Thread/unthread right sleeve, Thread/unthread left sleeve, Put head through opening, Pull shirt over trunk Assist Level: Touching or steadying assistance(Pt > 75%) Function - Lower Body Dressing/Undressing What is the patient wearing?: Hospital Gown Position: Bed Pants- Performed by helper: Thread/unthread right pants leg, Thread/unthread left pants leg, Pull pants up/down TED Hose - Performed by helper: Don/doff left TED hose, Don/doff right TED hose Assist for footwear: Dependant Assist for lower body dressing: Touching or steadying assistance (Pt > 75%)  Function - Toileting Toileting activity did not occur: No continent bowel/bladder event  Function - Air cabin crew transfer activity did not occur: Safety/medical concerns  Function - Chair/bed transfer Chair/bed transfer method: Squat pivot Chair/bed transfer assist level: Total assist (Pt < 25%) Chair/bed transfer assistive device: Sliding board Chair/bed transfer details: Manual facilitation for placement, Manual facilitation for weight shifting, Manual facilitation for weight bearing,  Verbal cues for safe use of DME/AE, Verbal cues for precautions/safety, Verbal cues for technique  Function - Locomotion: Wheelchair Will patient use wheelchair at discharge?: Yes Type: Manual Wheelchair activity did not occur: Safety/medical concerns Wheel 50 feet with 2 turns activity did not occur: Safety/medical concerns Wheel 150 feet activity did not occur: Safety/medical concerns Function - Locomotion: Ambulation Ambulation activity did not occur: Safety/medical concerns Walk 10 feet activity did not occur: Safety/medical concerns Walk 50 feet with 2 turns activity did not occur: Safety/medical concerns Walk 150 feet activity did not occur: Safety/medical concerns Walk 10 feet on uneven surfaces activity did not occur: Safety/medical concerns  Function - Comprehension Comprehension: Auditory Comprehension assist level: Understands basic 90% of the time/cues < 10% of the time  Function - Expression Expression: Verbal Expression assist level: Expresses basic 50 - 74% of the time/requires cueing 25 - 49% of the time. Needs to repeat parts of sentences.  Function - Social Interaction Social Interaction assist level: Interacts appropriately 90% of the time - Needs monitoring or encouragement for participation or interaction.  Function - Problem Solving Problem solving assist level: Solves basic 25 - 49% of the time - needs direction more than half the time to initiate, plan or complete simple activities  Function - Memory Memory assist level: Recognizes or recalls 25 - 49% of the time/requires cueing 50 - 75% of the time Patient normally able to recall (first 3 days only): Current season, Location of own room, Staff names and faces, That he or she is in a hospital  Medical Problem List and Plan: 1.  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 Cervical collar at  all times Cont CIR PT and OT Pt with hyperactive reflexes but no severe spasticity at this point, has when necessary tizanidine at bedtime 2.  DVT Prophylaxis/Anticoagulation: Subcutaneous heparin. Monitor platelet counts of any signs of bleeding 3. Pain Management/fibromyalgia/chronic back pain: Chronic prednisone 10 mg twice a day,  Oxycodone and Zanaflex as needed, has neurogenic pain increase Lyrica 31m BID, Allergy to gabapentin 4. Mood: Xanax 0.5 mg twice a day 5. Neuropsych: This patient is capable of making decisions on her own behalf. 6. Skin/Wound Care: Routine skin checks 7. Fluids/Electrolytes/Nutrition: Routine I&O's with follow-up chemistries Meal intake 50-100% 8. Hypertension. Hydrochlorothiazide 25 mg daily, lisinopril 20 mg daily. Monitor with increased mobility, elevated blood pressure today however appears to be outlier will not change treatment unless this becomes more of a trend Filed Vitals:   12/23/15 1335 12/24/15 0521  BP: 128/68 189/95  Pulse: 65 78  Temp: 98.4 F (36.9 C) 97.9 F (36.6 C)  Resp: 18 17   9. Constipation. Laxative assistance 10. Hyperlipidemia.Lovaza 11. Neurogenic bladder continue ICP program 12. Hypoalbuminemia will start pro-stat  LOS (Days) 3 A FACE TO FACE EVALUATION WAS PERFORMED  KIRSTEINS,ANDREW E 12/24/2015, 10:14 AM
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
## 2015-12-24 NOTE — IPOC Note (Addendum)
Overall Plan of Care [facility]) Patient Details Name: [name] MRN: [id number] DOB: [birth year]  Admitting Diagnosis: cervical myelopathy  Hospital Problems: Active Problems:   Myelopathy ([place])   Abnormality of gait   Acute incomplete spastic tetraplegia ([facility])   Fibromyalgia   Chronic back pain   Anxiety state   Benign essential HTN   Slow transit constipation   HLD (hyperlipidemia)   Urinary frequency     Functional Problem List: Nursing Behavior, Bladder, Bowel, Endurance, Edema, Medication Management, Motor, Nutrition, Sensory, Skin Integrity  PT Balance, Endurance, Motor, Pain  OT Balance, Endurance, Edema, Motor, Pain, Sensory  SLP Cognition  TR         Basic ADL's: OT Eating, Grooming, Bathing, Dressing, Toileting     Advanced  ADL's: OT       Transfers: PT Bed Mobility, Bed to Chair, Car  OT Toilet     Locomotion: PT Ambulation, Emergency planning/management officer, Stairs     Additional Impairments: OT Fuctional Use of Upper Extremity  SLP Swallowing      TR      Anticipated Outcomes Item Anticipated Outcome  Self Feeding set up  Swallowing  Min A   Basic self-care  min A  Toileting  mod A   Bathroom Transfers min A  Bowel/Bladder  Mod assist  Transfers  min A transfers  Locomotion  mod I w/c mobility, min A for gait, min A for stairs  Communication  Mod I  Cognition  Min A  Pain  < 3  Safety/Judgment  Min assist   Therapy Plan: PT Intensity: Minimum of 1-2 x/day ,45 to 90 minutes PT Frequency: 5 out of 7 days PT Duration Estimated Length of Stay: 26 to 28 days OT Intensity: Minimum of 1-2 x/day, 45 to 90 minutes OT Frequency: 5 out of 7 days OT Duration/Estimated Length of Stay: 28-30 days SLP Intensity: Minumum of 1-2 x/day, 30 to 90 minutes SLP Frequency: 3 to 5 out of 7 days SLP Duration/Estimated Length of Stay: 14 days       Team Interventions: Nursing Interventions Patient/Family Education, Bladder Management, Bowel  Management, Skin Care/Wound Management, Medication Management, Discharge Planning, Dysphagia/Aspiration Precaution Training  PT interventions Ambulation/gait training, Training and development officer, Community reintegration, Engineer, drilling, Functional electrical stimulation, Functional mobility training, Patient/family education, Neuromuscular re-education, Pain management, Psychosocial support, Stair training, UE/LE Strength taining/ROM, Wheelchair propulsion/positioning, Therapeutic Activities, UE/LE Coordination activities, Visual/perceptual remediation/compensation, Therapeutic Exercise, Splinting/orthotics  OT Interventions Training and development officer, Discharge planning, DME/adaptive equipment instruction, Functional mobility training, Neuromuscular re-education, Pain management, Patient/family education, Psychosocial support, Self Care/advanced ADL retraining, Therapeutic Activities, Therapeutic Exercise, UE/LE Strength taining/ROM, UE/LE Coordination activities  SLP Interventions Cognitive remediation/compensation, Dysphagia/aspiration precaution training, Cueing hierarchy, Functional tasks, Therapeutic Activities, Medication managment, Patient/family education, Therapeutic Exercise, Internal/external aids  TR Interventions    SW/CM Interventions Discharge Planning, Psychosocial Support, Patient/Family Education    Team Discharge Planning: Destination: PT-Home ,OT- Home , SLP-  Projected Follow-up: PT-Home health PT, OT-  Home health OT, SLP-  Projected Equipment Needs: PT-To be determined, OT- 3 in 1 bedside comode, SLP-  Equipment Details: PT- , OT-  Patient/family involved in discharge planning: PT- Patient,  OT-Patient, Family member/caregiver, SLP-Patient, Family member/caregiver  MD ELOS: 21-24d Medical Rehab Prognosis:  Fair Assessment: 70 y.o. right handed female with history of fibromyalgia, hypertension. Patient lives with sister. One level home with 3 steps to  entry. Presented 12/18/2015 for numbness and weakness involving both hands and lower extremities that has progressed over the past 6-8 months.  She had also been experiencing increased neck pain. Denied any bowel or bladder disturbances. She had been using a cane and later in need of a walker. X-rays and imaging revealed cervical spondylosis with follow-up with a C3-C6. Underwent two-stage approach discectomy at C3-4, C4-5 with decompression, placement of intravertebral biomechanical device, [employer] with C3-C6 laminectomy for decompression of spinal cord posterior segmental instrumentation 12/18/2015 per Dr. Kathyrn Sheriff. Hospital course pain management. Hard cervical collar at all times   Now requiring 24/7 Rehab RN,MD, as well as CIR level PT, OT and SLP.  Treatment team will focus on ADLs and mobility with goals set at min/mod A  See Team Conference Notes for weekly updates to the plan of care

## 2015-12-24 NOTE — Progress Notes (Signed)
Occupational Therapy Session Note  Patient Details  Name: [name] MRN: [id number] Date of Birth: 1945-12-16  Today's Date: 12/24/2015 OT Individual Time: 1045-1200 OT Individual Time Calculation (min): 75 min    Short Term Goals: Week 1:  OT Short Term Goal 1 (Week 1): Pt will be able to maintain sitting balance EOB with min A. OT Short Term Goal 2 (Week 1): Pt will be able to don a shirt with mod A. OT Short Term Goal 3 (Week 1): Pt will have improved grasp strength to hold washcloth to wash UB with mod A. OT Short Term Goal 4 (Week 1): Pt will be able to roll in bed with mod A to assist caregivers with LB dressing.  Skilled Therapeutic Interventions/Progress Updates:    Pt seen for OT ADL bathing/dressing session and for neuro re-ed. Pt in supine upon arrival, agreeable to tx session. Bathing and dressing completed from bed level. Pt able to grossly grasp washcloth to wash face and trunk. Total A required for remainder of body. She rolled with max A, able to hook elbow on bed rail to assist with rolling. Increased movement in R LE, able to bend knee up to prepare for bed mobility. She was able to assist with threading UEs into shirt, focusing on gross grasp and positioning. She transferred to EOB with max A +1 and sliding board transfer completed to w/c with max A +1 with +2 to steady equipment.  In therapy gym, worked on pt using tenodesis grasp to pick up/ release cups. With increased time and effort, pt able to use tenodesis to grasp small light weight cup with R hand. Unable to functionally use weak tenodesis grasp in L UE. Pt returned to room at end of session. Reviewed boosting schedule. Left in w/c with all needs in reach and family member present.   Therapy Documentation Precautions:  Precautions Precautions: Fall, Cervical Required Braces or Orthoses: Cervical Brace Cervical Brace: Hard collar, At all times Restrictions Weight Bearing Restrictions: No Pain:  4/10 in head and neck. Pt premedicated prior to tx session.   See Function Navigator for Current Functional Status.   Therapy/Group: Individual Therapy  Lewis, Tymar Polyak C 12/24/2015, 6:53 AM

## 2015-12-24 NOTE — Progress Notes (Signed)
Speech Language Pathology Daily Session Note  Patient Details  Name: [name] MRN: [id number] Date of Birth: 1946/05/17  Today's Date: 12/24/2015 SLP Individual Time: 1505-1530 SLP Individual Time Calculation (min): 25 min  Short Term Goals: Week 1: SLP Short Term Goal 1 (Week 1): Pt will complete mildly complex reasoning tasks with 90% accuracy and Mod A cues.  SLP Short Term Goal 2 (Week 1): Pt will increase intelligibility at the sentence level for 90% intelligibity and Min A  for use of compensatory strategies.  SLP Short Term Goal 3 (Week 1): Pt will complete money management tasks with 90% accuracy and Mod A cues.  SLP Short Term Goal 4 (Week 1): Pt will complete medication management tasks with 90% accuracy and Mod A cues.  SLP Short Term Goal 5 (Week 1): Pt will consume trials of puree without s/s of aspiration/dysphagia with Min A for use of compensatory strategies.   Skilled Therapeutic Interventions:  Pt was seen for skilled ST targeting dysphagia goals.  Pt politely declined trials of purees due to feeling full from lunch.  Pt consumed thin liquids with assistance for self feeding.  Pt noted with intermittently wet vocal quality following straw sips which cleared with self initiated use of throat clear.  Pt was left in bed with bed alarm set and call bell within reach.  Continue per current plan of care.   Function:  Eating Eating                 Cognition Comprehension Comprehension assist level: Follows basic conversation/direction with no assist;Follows complex conversation/direction with extra time/assistive device (Simultaneous filing. User may not have seen previous data.)  Expression   Expression assist level: Expresses basic needs/ideas: With extra time/assistive device (Simultaneous filing. User may not have seen previous data.)  Social Interaction Social Interaction assist level: Interacts appropriately 90% of the time - Needs monitoring or  encouragement for participation or interaction.  Problem Solving Problem solving assist level: Solves basic 75 - 89% of the time/requires cueing 10 - 24% of the time (Simultaneous filing. User may not have seen previous data.)  Memory Memory assist level: Recognizes or recalls 75 - 89% of the time/requires cueing 10 - 24% of the time (Simultaneous filing. User may not have seen previous data.)    Pain Pain Assessment Pain Assessment: No/denies pain  Therapy/Group: Individual Therapy  Lain Tetterton, Selinda Orion 12/24/2015, 3:52 PM

## 2015-12-24 NOTE — Progress Notes (Addendum)
Speech Language Pathology Daily Session Note  Patient Details  Name: [name] MRN: [id number] Date of Birth: May 11, 1946  Today's Date: 12/24/2015 SLP Individual Time: 1330-1430 SLP Individual Time Calculation (min): 60 min  Short Term Goals: Week 1: SLP Short Term Goal 1 (Week 1): Pt will complete mildly complex reasoning tasks with 90% accuracy and Mod A cues.  SLP Short Term Goal 2 (Week 1): Pt will increase intelligibility at the sentence level for 90% intelligibity and Min A  for use of compensatory strategies.  SLP Short Term Goal 3 (Week 1): Pt will complete money management tasks with 90% accuracy and Mod A cues.  SLP Short Term Goal 4 (Week 1): Pt will complete medication management tasks with 90% accuracy and Mod A cues.  SLP Short Term Goal 5 (Week 1): Pt will consume trials of puree without s/s of aspiration/dysphagia with Min A for use of compensatory strategies.   Skilled Therapeutic Interventions: Skilled treatment session focused on cognitive goals. Upon arrival, patient declined trials of thin liquids or puree textures due to patient just finishing eating her lunch. SLP facilitated session by providing extra time and Mod A verbal cues for patient to complete a basic money management task in a moderately distracting environment for ~10 minutes with Min A verbal cues for redirection. SLP also facilitated session by providing Min A verbal cues for use of a slow rate to maximize speech intelligibility to ~75% at the sentence level. Patient transferred back to bed at end of session with total +2 for safety. Patient left supine in bed with all needs within reach. Continue with current plan of care.     Function:  Cognition Comprehension Comprehension assist level: Understands basic 90% of the time/cues < 10% of the time  Expression   Expression assist level: Expresses basic 75 - 89% of the time/requires cueing 10 - 24% of the time. Needs helper to occlude trach/needs  to repeat words.  Social Interaction    Problem Solving Problem solving assist level: Solves basic 50 - 74% of the time/requires cueing 25 - 49% of the time  Memory Memory assist level: Recognizes or recalls 50 - 74% of the time/requires cueing 25 - 49% of the time    Pain Patient reported pain in her neck/shoulders, Patient repositioned   Therapy/Group: Individual Therapy  Jodeen Mclin 12/24/2015, 3:51 PM

## 2015-12-24 NOTE — Progress Notes (Signed)
Physical Therapy Session Note  Patient Details  Name: [name] MRN: [id number] Date of Birth: June 17, 1946  Today's Date: 12/24/2015 PT Individual Time: 0900-1000 PT Individual Time Calculation (min): 60 min   Short Term Goals: Week 1:  PT Short Term Goal 1 (Week 1): Pt will increase rolling with side rails to mod A.  PT Short Term Goal 2 (Week 1): Pt willl increase transfers supine to edge of bed, edge of bed to supine to max A.  PT Short Term Goal 3 (Week 1): Pt will increase transfers bed to chair to max A.  PT Short Term Goal 4 (Week 1): Pt will increase ambulation with appropriate assistive device to max A about 10 feet.  PT Short Term Goal 5 (Week 1): Pt will propel w/c about 50 feet with min A.   Skilled Therapeutic Interventions/Progress Updates:   Pt received sitting up in bed with family present.  Pt reporting significant pain in neck, back of head, UE and LE.  RN notified but pt premedicated and due for medication in one hour.  Discussed with pt timing of medication (which is PRN) by looking at schedule the day before and planning on asking premedicating up to one hour before start of therapy next day; pt verbalized understanding.  Pt agreeable to therapy but is requesting OT before PT due to pt currently bathing at bed level; will alert scheduling.  Pt performed supine > sit with HOB elevated with max A with pt able to advance each LE to EOB but required assistance from PT to bring trunk upright and for lateral leans to scoot to EOB.  Performed slideboard bed > w/c with max A to maintain balance during forwards lean but with pt assisting with 25-30% of scooting with UE and LE into chair.  Pt transported to gym in tilt in space w/c total A.  Pt educated on timing and method of pressure relief in tilt in space w/c and demonstrated full sequence to pt.  Pt set up with Stedy and attempted sit > stand from w/c x 3-4 reps with +2 total A.  Pt able to come to partial stand but  unable to complete full trunk, hip and knee extension.  May benefit more from standing frame at this time.  Returned to w/c and to room where pt transferred back to bed with slideboard and max A but +2 for sit > supine.  Pt set up in bed with UE supported to receive pain medication and await OT.    Therapy Documentation Precautions:  Precautions Precautions: Fall, Cervical Required Braces or Orthoses: Cervical Brace Cervical Brace: Hard collar, At all times Restrictions Weight Bearing Restrictions: No Vital Signs: Therapy Vitals Pulse Rate: 82 BP: 125/76 mmHg Patient Position (if appropriate): Lying Pain: Pain Assessment Pain Assessment: 0-10 Pain Score: 10-Worst pain ever Pain Type: Acute pain Pain Location: Neck Pain Descriptors / Indicators: Aching Pain Intervention(s): Medication (See eMAR)  See Function Navigator for Current Functional Status.   Therapy/Group: Individual Therapy  Raylene Everts [facility] 12/24/2015, 10:42 AM

## 2015-12-24 NOTE — Progress Notes (Signed)
Continues to have difficulty swallowing crushed meds. Complaining of increased sore throat pain and posterior neck incision pain. May need to schedule pain meds. BLE edema. No voids, I & O cath every 6-8 hours. Patrici Ranks A

## 2015-12-24 NOTE — Plan of Care (Signed)
Problem: SCI BLADDER ELIMINATION Goal: RH STG MANAGE BLADDER WITH ASSISTANCE STG Manage Bladder With max Assistance  Outcome: Not Progressing I & O caths-total assistance Goal: RH STG MANAGE BLADDER WITH EQUIPMENT WITH ASSISTANCE STG Manage Bladder With Equipment With max Assistance  Outcome: Not Progressing Total assistance

## 2015-12-24 NOTE — Care Management Note (Signed)
Inpatient Rehabilitation Center Individual Statement of Services  Patient Name:  [name]  Date:  12/24/2015  Welcome to the [facility].  Our goal is to provide you with an individualized program based on your diagnosis and situation, designed to meet your specific needs.  With this comprehensive rehabilitation program, you will be expected to participate in at least 3 hours of rehabilitation therapies Monday-Friday, with modified therapy programming on the weekends.  Your rehabilitation program will include the following services:  Physical Therapy (PT), Occupational Therapy (OT), Speech Therapy (ST), 24 hour per day rehabilitation nursing, Therapeutic Recreaction (TR), Case Management (Social Worker), Rehabilitation Medicine, Nutrition Services and Pharmacy Services  Weekly team conferences will be held on Tuesdays to discuss your progress.  Your Social Worker will talk with you frequently to get your input and to update you on team discussions.  Team conferences with you and your family in attendance may also be held.  Expected length of stay: 3-4 weeks  Overall anticipated outcome: minimal assistance  Depending on your progress and recovery, your program may change. Your Social Worker will coordinate services and will keep you informed of any changes. Your Social Worker's name and contact numbers are listed  below.  The following services may also be recommended but are not provided by the [facility] will be made to provide these services after discharge if needed.  Arrangements include referral to agencies that provide these services.  Your insurance has been verified to be:  Medicare Your primary doctor is:  Alysia Penna  Pertinent information will be shared with your doctor and your insurance company.  Social  Worker:  [place], [place] or [phone]   Information discussed with and copy given to patient by: Lennart Pall, 12/24/2015, 2:17 PM

## 2015-12-25 ENCOUNTER — Inpatient Hospital Stay (HOSPITAL_COMMUNITY): Payer: Medicare Other | Admitting: Occupational Therapy

## 2015-12-25 ENCOUNTER — Inpatient Hospital Stay (HOSPITAL_COMMUNITY): Payer: Medicare Other | Admitting: Speech Pathology

## 2015-12-25 ENCOUNTER — Inpatient Hospital Stay (HOSPITAL_COMMUNITY): Payer: Medicare Other | Admitting: Physical Therapy

## 2015-12-25 MED ORDER — SENNOSIDES 8.8 MG/5ML PO SYRP
5.0000 mL | ORAL_SOLUTION | Freq: Two times a day (BID) | ORAL | Status: DC
  Administered 2015-12-26 – 2016-01-09 (×22): 5 mL via ORAL
  Filled 2015-12-25 (×30): qty 5

## 2015-12-25 NOTE — Progress Notes (Signed)
Subjective/Complaints: Burning in arms and legs.--?better. Doesn't have much appetite--not "a big eater." has basin next to her for "coughing up phlegm"     Review of systems denies any breathing problems no chest pain no shortness of breath, positive nausea,, negative vomiting negative abdominal pain.  Objective: Vital Signs: Blood pressure 138/67, pulse 77, temperature 97.8 F (36.6 C), temperature source Oral, resp. rate 18, height _0  (1.753 m), last menstrual period 03/31/2004, SpO2 99 %. No results found. Results for orders placed or performed during the hospital encounter of 12/21/15 (from the past 72 hour(s))  CBC WITH DIFFERENTIAL     Status: Abnormal   Collection Time: 12/24/15  4:55 AM  Result Value Ref Range   WBC 7.4 4.0 - 10.5 K/uL   RBC 2.89 (L) 3.87 - 5.11 MIL/uL   Hemoglobin 8.9 (L) 12.0 - 15.0 g/dL   HCT 26.9 (L) 36.0 - 46.0 %   MCV 93.1 78.0 - 100.0 fL   MCH 30.8 26.0 - 34.0 pg   MCHC 33.1 30.0 - 36.0 g/dL   RDW 12.2 11.5 - 15.5 %   Platelets 171 150 - 400 K/uL   Neutrophils Relative % 83 %   Neutro Abs 6.2 1.7 - 7.7 K/uL   Lymphocytes Relative 10 %   Lymphs Abs 0.7 0.7 - 4.0 K/uL   Monocytes Relative 7 %   Monocytes Absolute 0.5 0.1 - 1.0 K/uL   Eosinophils Relative 0 %   Eosinophils Absolute 0.0 0.0 - 0.7 K/uL   Basophils Relative 0 %   Basophils Absolute 0.0 0.0 - 0.1 K/uL  Comprehensive metabolic panel     Status: Abnormal   Collection Time: 12/24/15  4:55 AM  Result Value Ref Range   Sodium 135 135 - 145 mmol/L   Potassium 4.3 3.5 - 5.1 mmol/L   Chloride 99 (L) 101 - 111 mmol/L   CO2 32 22 - 32 mmol/L   Glucose, Bld 96 65 - 99 mg/dL   BUN 17 6 - 20 mg/dL   Creatinine, Ser 0.53 0.44 - 1.00 mg/dL   Calcium 8.8 (L) 8.9 - 10.3 mg/dL   Total Protein 5.5 (L) 6.5 - 8.1 g/dL   Albumin 2.5 (L) 3.5 - 5.0 g/dL   AST 27 15 - 41 U/L   ALT 33 14 - 54 U/L   Alkaline Phosphatase 162 (H) 38 - 126 U/L   Total Bilirubin 0.8 0.3 - 1.2 mg/dL   GFR calc non  Af Amer >60 >60 mL/min   GFR calc Af Amer >60 >60 mL/min    Comment: (NOTE) The eGFR has been calculated using the CKD EPI equation. This calculation has not been validated in all clinical situations. eGFR's persistently <60 mL/min signify possible Chronic Kidney Disease.    Anion gap 4 (L) 5 - 15     General: No acute distress Mood and affect are flat Heart: Regular rate and rhythm no rubs murmurs or extra sounds Lungs: Clear to auscultation, breathing unlabored, no rales or wheezes Abdomen: Positive bowel sounds, soft nontender to palpation, nondistended Extremities: No clubbing, cyanosis, or edema Skin: No evidence of breakdown, no evidence of rash Neurologic: Cranial nerves II through XII intact, motor strength is 3-/5 in bilateral deltoid, bicep,trace  tricep,  2- grip, 1 to 2-/5 hip flexor, knee extensors, ankle dorsiflexor and plantar flexor, trace right toe flexion right moving more than left Sensory exam absent in bilateral lower extremities to light touch  Musculoskeletal: Full passive  range of motion  in all 4 extremities. No joint swelling   Assessment/Plan: 1. 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 Physiatrist is providing close team supervision and 24 hour management of active medical problems listed below. Physiatrist and rehab team continue to assess barriers to discharge/monitor patient progress toward functional and medical goals. FIM: Function - Bathing Position: Bed Body parts bathed by patient: Chest, Abdomen Body parts bathed by helper: Right arm, Left arm, Front perineal area, Buttocks, Right upper leg, Left upper leg, Right lower leg, Left lower leg, Back Assist Level: 2 helpers  Function- Upper Body Dressing/Undressing What is the patient wearing?: Pull over shirt/dress Pull over shirt/dress - Perfomed by patient:  Thread/unthread right sleeve, Thread/unthread left sleeve Pull over shirt/dress - Perfomed by helper: Put head through opening, Pull shirt over trunk Assist Level: 2 helpers Function - Lower Body Dressing/Undressing What is the patient wearing?: Pants, Ted Hose, Non-skid slipper socks Position: Bed Pants- Performed by helper: Thread/unthread right pants leg, Thread/unthread left pants leg, Pull pants up/down TED Hose - Performed by helper: Don/doff left TED hose, Don/doff right TED hose Assist for footwear: Dependant Assist for lower body dressing: 2 Helpers  Function - Toileting Toileting activity did not occur: No continent bowel/bladder event  Function - Air cabin crew transfer activity did not occur: Safety/medical concerns  Function - Chair/bed transfer Chair/bed transfer method: Lateral scoot Chair/bed transfer assist level: 2 helpers Chair/bed transfer assistive device: Sliding board Chair/bed transfer details: Manual facilitation for placement, Manual facilitation for weight shifting, Manual facilitation for weight bearing, Verbal cues for safe use of DME/AE, Verbal cues for precautions/safety, Verbal cues for technique  Function - Locomotion: Wheelchair Will patient use wheelchair at discharge?: Yes Type: Manual Wheelchair activity did not occur: Safety/medical concerns Assist Level: Dependent (Pt equals 0%) Wheel 50 feet with 2 turns activity did not occur: Safety/medical concerns Assist Level: Dependent (Pt equals 0%) Wheel 150 feet activity did not occur: Safety/medical concerns Assist Level: Dependent (Pt equals 0%) Function - Locomotion: Ambulation Ambulation activity did not occur: Safety/medical concerns Walk 10 feet activity did not occur: Safety/medical concerns Walk 50 feet with 2 turns activity did not occur: Safety/medical concerns Walk 150 feet activity did not occur: Safety/medical concerns Walk 10 feet on uneven surfaces activity did not occur:  Safety/medical concerns  Function - Comprehension Comprehension: Auditory Comprehension assist level: Understands basic 90% of the time/cues < 10% of the time  Function - Expression Expression: Verbal Expression assist level: Expresses basic 50 - 74% of the time/requires cueing 25 - 49% of the time. Needs to repeat parts of sentences.  Function - Social Interaction Social Interaction assist level: Interacts appropriately 90% of the time - Needs monitoring or encouragement for participation or interaction.  Function - Problem Solving Problem solving assist level: Solves basic 25 - 49% of the time - needs direction more than half the time to initiate, plan or complete simple activities  Function - Memory Memory assist level: Recognizes or recalls 25 - 49% of the time/requires cueing 50 - 75% of the time Patient normally able to recall (first 3 days only): Current season, Location of own room, Staff names and faces, That he or she is in a hospital  Medical Problem List and Plan: 1.  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 Cervical collar at all times   - CIR  PT and OT  -team conference today  2.  DVT Prophylaxis/Anticoagulation: Subcutaneous heparin. Monitor platelet counts of any signs of bleeding 3. Pain Management/fibromyalgia/chronic back pain: Chronic prednisone 10 mg twice a day,  Oxycodone and Zanaflex as needed,    - Lyrica 62m BID for neuropathic pain 4. Mood: Xanax 0.5 mg twice a day 5. Neuropsych: This patient is capable of making decisions on her own behalf. 6. Skin/Wound Care: Routine skin checks 7. Fluids/Electrolytes/Nutrition:  Meal intake 50-100% 8. Hypertension. Hydrochlorothiazide 25 mg daily, lisinopril 20 mg daily. Monitor with increased mobility, elevated blood pressure today however appears to be outlier will not change treatment unless this  becomes more of a trend Filed Vitals:   12/24/15 1552 12/25/15 0540  BP: 150/76 138/67  Pulse: 70 77  Temp: 97.4 F (36.3 C) 97.8 F (36.6 C)  Resp: 18 18   9. Constipation. Laxative assistance 10. Hyperlipidemia.Lovaza 11. Neurogenic bladder continue ICP program. Has no sense of bladder filling 12. Hypoalbuminemia will start pro-stat  LOS (Days) 4 A FACE TO FACE EVALUATION WAS PERFORMED  SWARTZ,ZACHARY T 12/25/2015, 8:30 AM

## 2015-12-25 NOTE — Plan of Care (Signed)
Problem: SCI BOWEL ELIMINATION Goal: RH STG MANAGE BOWEL WITH ASSISTANCE STG Manage Bowel with mod Assistance.  Outcome: Not Progressing In & out cath

## 2015-12-25 NOTE — Progress Notes (Signed)
Physical Therapy Session Note  Patient Details  Name: [name] MRN: [id number] Date of Birth: Oct 08, 1945  Today's Date: 12/25/2015 PT Individual Time: 1100-1200 PT Individual Time Calculation (min): 60 min   Short Term Goals: Week 1:  PT Short Term Goal 1 (Week 1): Pt will increase rolling with side rails to mod A.  PT Short Term Goal 2 (Week 1): Pt willl increase transfers supine to edge of bed, edge of bed to supine to max A.  PT Short Term Goal 3 (Week 1): Pt will increase transfers bed to chair to max A.  PT Short Term Goal 4 (Week 1): Pt will increase ambulation with appropriate assistive device to max A about 10 feet.  PT Short Term Goal 5 (Week 1): Pt will propel w/c about 50 feet with min A.   Skilled Therapeutic Interventions/Progress Updates:   Patient sitting in recliner upon arrival, reporting significant head, neck, and shoulder pain but reports being premedicated. Patient lethargic throughout session and frequently stated she felt like she was going to "pass out," with change in position she reported no relief or improvement in pain. Performed slide board transfer recliner <> mat table with max A and +2 for safety/placement of slide board. Patient required mod-max A for static sitting edge of mat. Patient agreeable to attempt standing frame from edge of mat and stood x 30 sec on first trial and x 4 min on second trial. Vitals monitored throughout, 120/66 seated, 120/79 standing, 165/77 in semi reclined position, and dropping to 106/88 after standing approx 4 min. Patient provided multiple rest breaks reclined on wedge with pillows. Patient elected to stay up in recliner for lunch with soft call bell in lap.    Therapy Documentation Precautions:  Precautions Precautions: Fall, Cervical Required Braces or Orthoses: Cervical Brace Cervical Brace: Hard collar, At all times Restrictions Weight Bearing Restrictions: No Vital Signs: Therapy Vitals BP: 120/66  mmHg Patient Position (if appropriate): Standing Oxygen Therapy SpO2: 99 % O2 Device: Not Delivered Pain: Pain Assessment Pain Assessment: 0-10 Pain Score: 10-Worst pain ever Pain Type: Acute pain Pain Location: Neck Pain Orientation: Posterior Pain Descriptors / Indicators: Aching Pain Frequency: Intermittent Pain Onset: On-going Pain Intervention(s): Repositioned;Rest (premedicated)   See Function Navigator for Current Functional Status.   Therapy/Group: Individual Therapy  Laretta Alstrom 12/25/2015, 12:44 PM

## 2015-12-25 NOTE — Progress Notes (Signed)
Occupational Therapy Session Note  Patient Details  Name: [name] MRN: [id number] Date of Birth: 1946/02/15  Today's Date: 12/25/2015 OT Individual Time: 0845-1000 OT Individual Time Calculation (min): 75 min    Short Term Goals: Week 1:  OT Short Term Goal 1 (Week 1): Pt will be able to maintain sitting balance EOB with min A. OT Short Term Goal 2 (Week 1): Pt will be able to don a shirt with mod A. OT Short Term Goal 3 (Week 1): Pt will have improved grasp strength to hold washcloth to wash UB with mod A. OT Short Term Goal 4 (Week 1): Pt will be able to roll in bed with mod A to assist caregivers with LB dressing.  Skilled Therapeutic Interventions/Progress Updates:    Pt. Seen during skilled OT sessions for increased independence in ADLs. Upon entering room Pt. Complained of pain level 100/10n her head and neck , but was pre-medicated prior to treatment session. With encouragement from OT, Pt. completed upper body bathing using mit with Mod A from OT to reach under armpits and chest. Dependent for lower body bathing. With setup setup assist pt. Was able to grossly grasp to pull sleeves of her shirt, but required OT to pull shirt over her head and down over her body.  Pt. Required +2  to roll in bed and to pull pants up. Pt. Required total A+1 with +2 for safety via sliding board to transfer from bed to Recliner. Pt. Refused brushing teeth, but worked on grasp by using wash rag to brush tongue. OT brushed pt. tongue with toothbrush later to ensure it was clean. Pt. Combed hair with hand over hand by OT. OT worked with Pt. On proper positioning to help her be more comfortable in recliner in preparation for feeding with ST. Pt. Motivated during therapy sessions, but discouraged by performance with functional tasks, encouragement provided and discussed role of OT and OT goals.                                                                                                                               Therapy Documentation Precautions:  Precautions Precautions: Fall, Cervical Required Braces or Orthoses: Cervical Brace Cervical Brace: Hard collar, At all times Restrictions Weight Bearing Restrictions: No Pain: Pain Assessment Pain Assessment: 0-10 Pain Score: 10-Worst pain ever Pain Type: Acute pain Pain Location: Neck Pain Descriptors / Indicators: Aching;Sore Pain Frequency: Intermittent Pain Onset: With Activity Pain Intervention(s): Repositioned  See Function Navigator for Current Functional Status.   Therapy/Group: Individual Therapy  Lewis, Enio Hornback C 12/25/2015, 7:12 AM

## 2015-12-25 NOTE — Progress Notes (Signed)
Patient information reviewed and entered into eRehab system by Muskan Bolla, RN, CRRN, PPS Coordinator.  Information including medical coding and functional independence measure will be reviewed and updated through discharge.     Per nursing patient was given "Data Collection Information Summary for Patients in Inpatient Rehabilitation Facilities with attached "Privacy Act Statement-Health Care Records" upon admission.  

## 2015-12-25 NOTE — Progress Notes (Signed)
Speech Language Pathology Daily Session Note  Patient Details  Name: [name] MRN: [id number] Date of Birth: [birth year]  Today's Date: 12/25/2015 SLP Individual Time: 1000-1055 SLP Individual Time Calculation (min): 55 min  Short Term Goals: Week 1: SLP Short Term Goal 1 (Week 1): Pt will complete mildly complex reasoning tasks with 90% accuracy and Mod A cues.  SLP Short Term Goal 2 (Week 1): Pt will increase intelligibility at the sentence level for 90% intelligibity and Min A  for use of compensatory strategies.  SLP Short Term Goal 3 (Week 1): Pt will complete money management tasks with 90% accuracy and Mod A cues.  SLP Short Term Goal 4 (Week 1): Pt will complete medication management tasks with 90% accuracy and Mod A cues.  SLP Short Term Goal 5 (Week 1): Pt will consume trials of puree without s/s of aspiration/dysphagia with Min A for use of compensatory strategies.   Skilled Therapeutic Interventions: Skilled treatment session focused on dysphagia and cognitive goals. Upon arrival, patient was awake while upright in the recliner but was lethargic and reported 100/10 pain. RN made aware and administered medications.  Patient consumed medications crushed in puree, pureed textures and thin liquids with delayed cough X 1, suspect due to large, sequential sips of thin liquids. Patient also utilized multiple swallows across all consistencies, suspect due to pharyngeal residue with report X 1 of pureed textures "sticking" which was reduced with multiple dry swallows. Recommend patient continue current diet. SLP also facilitated session by providing intermittent verbal cues for use of a slow rate and an increased vocal intensity to maximize speech intelligibility to ~90% at the sentence level. Patient left upright in recliner with all needs within reach. Continue with current plan of care.    Function:  Eating Eating   Modified Consistency Diet: Yes Eating Assist Level: Helper  feeds patient           Cognition Comprehension Comprehension assist level: Understands basic 90% of the time/cues < 10% of the time  Expression   Expression assist level: Expresses basic 75 - 89% of the time/requires cueing 10 - 24% of the time. Needs helper to occlude trach/needs to repeat words.  Social Interaction Social Interaction assist level: Interacts appropriately 90% of the time - Needs monitoring or encouragement for participation or interaction.  Problem Solving Problem solving assist level: Solves basic 50 - 74% of the time/requires cueing 25 - 49% of the time  Memory Memory assist level: Recognizes or recalls 50 - 74% of the time/requires cueing 25 - 49% of the time    Pain Pain Assessment Pain Assessment: 0-10 Pain Score: 10-Worst pain ever ("100" per pt) Pain Type: Acute pain Pain Location: Neck Pain Descriptors / Indicators: Aching Pain Frequency: Intermittent Pain Onset: Gradual Pain Intervention(s): Medication (See eMAR)  Therapy/Group: Individual Therapy  Charnay Nazario 12/25/2015, 12:07 PM

## 2015-12-25 NOTE — Progress Notes (Signed)
Occupational Therapy Session Note  Patient Details  Name: SOLIYANA CLOTFELTER MRN: [id number] Date of Birth: 1945-12-04  Today's Date: 12/25/2015 OT Individual Time: 1300-1400 OT Individual Time Calculation (min): 60 min    Short Term Goals: Week 1:  OT Short Term Goal 1 (Week 1): Pt will be able to maintain sitting balance EOB with min A. OT Short Term Goal 2 (Week 1): Pt will be able to don a shirt with mod A. OT Short Term Goal 3 (Week 1): Pt will have improved grasp strength to hold washcloth to wash UB with mod A. OT Short Term Goal 4 (Week 1): Pt will be able to roll in bed with mod A to assist caregivers with LB dressing.  Skilled Therapeutic Interventions/Progress Updates:    1:1Functional transfers with a slide board with focus on achieving a forward weight shift, assisting with lateral weight shifts during transfer and management of bilateral LEs with transfers. Pt requires consistent min A to maintain sitting balance in static sitting and mod to max sitting balance during a task. Focus on static sitting balance with focus on postural control and minor weight shifts to maintain upright posture at midline. Pt able to make postural adjustments but requires max cuing and difficulty doing all small adjustments together (segmented). Pt request laying down often in the session to get pain relief in back of head and neck. Total A to return to sitting position. Trial of using a dorsal hand splint with u cuff in prep for working on self feeding and tooth brushing. Performing in reclined position (laying on a wedge) and sitting up with bilateral UEs supported on bedside table. In both positioned pt required mod A to maintain position while trying to bring hand to mouth and at a slow pace (unable to perform an entire meal). Will continue to address. Return to bed with +2 for trunk and bilateral LEs total A.  Worked on doffing clothign with lateral leans and rolling. Pt able to bring bilateral  knees to chest with extra time and min A for support to assist caregiver with doffing pants. Total A to don gown.   Therapy Documentation Precautions:  Precautions Precautions: Fall, Cervical Required Braces or Orthoses: Cervical Brace Cervical Brace: Hard collar, At all times Restrictions Weight Bearing Restrictions: No General:   Vital Signs: Therapy Vitals Temp: 97.5 F (36.4 C) Temp Source: Oral Pulse Rate: (!) 56 Resp: 16 BP: (!) 147/67 mmHg Patient Position (if appropriate): Lying Oxygen Therapy SpO2: 98 % O2 Device: Not Delivered Pain: Pain Assessment Pain Assessment: 0-10 Pain Score: 10-Worst pain ever Pain Type: Acute pain Pain Location: Neck Pain Orientation: Posterior Pain Descriptors / Indicators: Aching Pain Onset: On-going Pain Intervention(s): Medication (See eMAR) ADL:   Exercises:   Other Treatments:    See Function Navigator for Current Functional Status.   Therapy/Group: Individual Therapy  Willeen Cass [facility] 12/25/2015, 3:35 PM

## 2015-12-26 ENCOUNTER — Inpatient Hospital Stay (HOSPITAL_COMMUNITY): Payer: Medicare Other

## 2015-12-26 ENCOUNTER — Inpatient Hospital Stay (HOSPITAL_COMMUNITY): Payer: Medicare Other | Admitting: Speech Pathology

## 2015-12-26 ENCOUNTER — Inpatient Hospital Stay (HOSPITAL_COMMUNITY): Payer: Medicare Other | Admitting: Physical Therapy

## 2015-12-26 ENCOUNTER — Inpatient Hospital Stay (HOSPITAL_COMMUNITY): Payer: Medicare Other | Admitting: Occupational Therapy

## 2015-12-26 DIAGNOSIS — R609 Edema, unspecified: Secondary | ICD-10-CM

## 2015-12-26 LAB — URINALYSIS, ROUTINE W REFLEX MICROSCOPIC
BILIRUBIN URINE: NEGATIVE
GLUCOSE, UA: NEGATIVE mg/dL
Ketones, ur: 15 mg/dL — AB
Nitrite: NEGATIVE
Protein, ur: 100 mg/dL — AB
SPECIFIC GRAVITY, URINE: 1.023 (ref 1.005–1.030)
pH: 7 (ref 5.0–8.0)

## 2015-12-26 LAB — URINE MICROSCOPIC-ADD ON: Squamous Epithelial / LPF: NONE SEEN

## 2015-12-26 MED ORDER — METHOCARBAMOL 500 MG PO TABS
500.0000 mg | ORAL_TABLET | Freq: Four times a day (QID) | ORAL | Status: DC
  Administered 2015-12-26 – 2016-01-01 (×26): 500 mg via ORAL
  Filled 2015-12-26 (×26): qty 1

## 2015-12-26 MED ORDER — ENOXAPARIN SODIUM 30 MG/0.3ML ~~LOC~~ SOLN
30.0000 mg | Freq: Two times a day (BID) | SUBCUTANEOUS | Status: DC
  Administered 2015-12-26 – 2016-01-15 (×41): 30 mg via SUBCUTANEOUS
  Filled 2015-12-26 (×41): qty 0.3

## 2015-12-26 MED ORDER — PREGABALIN 50 MG PO CAPS
50.0000 mg | ORAL_CAPSULE | Freq: Three times a day (TID) | ORAL | Status: DC
  Administered 2015-12-26 – 2015-12-31 (×15): 50 mg via ORAL
  Filled 2015-12-26 (×15): qty 1

## 2015-12-26 NOTE — Patient Care Conference (Signed)
Inpatient RehabilitationTeam Conference and Plan of Care Update Date: 12/25/2015   Time: 2:20 PM    Patient Name: [name]      Medical Record Number: [id number]  Date of Birth: 15-May-1946 Sex: Female         Room/Bed: 4W07C/4W07C-01 Payor Info: Payor: MEDICARE / Plan: MEDICARE PART A AND B / Product Type: *No Product type* /    Admitting Diagnosis: cervical myelopathy  Admit Date/Time:  12/21/2015 12:59 PM Admission Comments: No comment available   Primary Diagnosis:  <principal problem not specified> Principal Problem: <principal problem not specified>  Patient Active Problem List   Diagnosis Date Noted  . Myelopathy ([place]) 12/21/2015  . Abnormality of gait   . Acute incomplete spastic tetraplegia ([place])   . Fibromyalgia   . Chronic back pain   . Anxiety state   . Benign essential HTN   . Slow transit constipation   . HLD (hyperlipidemia)   . Urinary frequency   . Cervical spondylosis with myelopathy 12/18/2015  . Chest pain 03/27/2014  . Unspecified constipation 12/02/2013  . Hypokalemia 05/31/2013  . Insomnia 05/29/2011  . Lipoma 02/05/2011  . HYPERLIPIDEMIA 09/24/2006  . Depression 04/29/2006  . Essential hypertension 04/29/2006  . Myalgia and myositis 04/29/2006  . GERD 03/15/1998    Expected Discharge Date: Expected Discharge Date: 01/18/16  Team Members Present: Physician leading conference: Dr. Alger Simons Social Worker Present: Lennart Pall, LCSW Nurse Present: Dorien Chihuahua, RN PT Present: Jorge Mandril, PT OT Present: Napoleon Form, OT SLP Present: Weston Anna, SLP PPS Coordinator present : Daiva Nakayama, RN, CRRN     Current Status/Progress Goal Weekly Team Focus  Medical   cervical myelpahty s/p C3-6 fusion. incomplete SCI. neruogenic bowel and bladder  neurological improvement  bowel and bladder mgt, skin care, pain control   Bowel/Bladder   in & out cath q6-8 hrs, incontinent LBM 5/28  able to void, continent  monitor    Swallow/Nutrition/ Hydration   Full liquid diet, Supervision-Min A for use of strategies  Min A   tolerance of current diet, use of swallow strateiges, possible trials of upgraded textures    ADL's   Total A LB dressing; max- total A UB dressing; Total A functional transfers using sliding board; max- total self feeding and grooming  Min A overall  Pain management, ADL re-training; sitting balance   Mobility   Total A for bed mobility, slideboard transfers, w/c mobility in tilt in space, standing  Min A overall  Pain management, balance/trunk control, transfers and WB through LE   Communication   Supervision-Min A  Mod I  increased use of speech intelligibility strategies   Safety/Cognition/ Behavioral Observations  Mod A  Min A  problem solving with functional tasks   Pain   percocet 1-2 tab q4 hrs PRN, scheduled Lyrica. Requires percocet only BID most days  < 2      Skin   neck incisions with dermabond, cervical collar   free of skin breakdown & infection  monitor skin    Rehab Goals Patient on target to meet rehab goals: Yes *See Care Plan and progress notes for long and short-term goals.  Barriers to Discharge: level of injury, severity     Possible Resolutions to Barriers:  continued education, assistance at home for bowel/bladder/care    Discharge Planning/Teaching Needs:  Home with sister and brother-in-law who can provide 24/7 assistance.  to be scheduled   Team Discussion:  currrently max - total assistance with PT/OT.  Still on full liquid and not ready to upgrade.  Picking up on mild cognitive deficits i.e.memory, money management and recall.  Significantly weak and anticipate longer LOS>  Revisions to Treatment Plan:  None   Continued Need for Acute Rehabilitation Level of Care: The patient requires daily medical management by a physician with specialized training in physical medicine and rehabilitation for the following conditions: Daily direction of a  multidisciplinary physical rehabilitation program to ensure safe treatment while eliciting the highest outcome that is of practical value to the patient.: Yes Daily medical management of patient stability for increased activity during participation in an intensive rehabilitation regime.: Yes Daily analysis of laboratory values and/or radiology reports with any subsequent need for medication adjustment of medical intervention for : Post surgical problems;Urological problems;Wound care problems;Neurological problems  Asalee Barrette 12/26/2015, 3:19 PM

## 2015-12-26 NOTE — Progress Notes (Signed)
Social Work Patient ID: [name], female   DOB: 09/17/1945, 70 y.o.   MRN: [id number]   Lowella Curb, LCSW Social Worker Signed  Patient Care Conference 12/26/2015  1:25 PM    Expand All Collapse All   Inpatient RehabilitationTeam Conference and Plan of Care Update Date: 12/25/2015   Time: 2:20 PM     Patient Name: [name]       Medical Record Number: [id number]  Date of Birth: Dec 09, 1945 Sex: Female         Room/Bed: 4W07C/4W07C-01 Payor Info: Payor: MEDICARE / Plan: MEDICARE PART A AND B / Product Type: *No Product type* /    Admitting Diagnosis: cervical myelopathy   Admit Date/Time:  12/21/2015 12:59 PM Admission Comments: No comment available   Primary Diagnosis:  <principal problem not specified> Principal Problem: <principal problem not specified>    Patient Active Problem List     Diagnosis  Date Noted   .  Myelopathy ([place])  12/21/2015   .  Abnormality of gait     .  Acute incomplete spastic tetraplegia ([place])     .  Fibromyalgia     .  Chronic back pain     .  Anxiety state     .  Benign essential HTN     .  Slow transit constipation     .  HLD (hyperlipidemia)     .  Urinary frequency     .  Cervical spondylosis with myelopathy  12/18/2015   .  Chest pain  03/27/2014   .  Unspecified constipation  12/02/2013   .  Hypokalemia  05/31/2013   .  Insomnia  05/29/2011   .  Lipoma  02/05/2011   .  HYPERLIPIDEMIA  09/24/2006   .  Depression  04/29/2006   .  Essential hypertension  04/29/2006   .  Myalgia and myositis  04/29/2006   .  GERD  03/15/1998     Expected Discharge Date: Expected Discharge Date: 01/18/16  Team Members Present: Physician leading conference: Dr. Alger Simons Social Worker Present: Lennart Pall, LCSW Nurse Present: Dorien Chihuahua, RN PT Present: Jorge Mandril, PT OT Present: Napoleon Form, OT SLP Present: Weston Anna, SLP PPS Coordinator present : Daiva Nakayama, RN, CRRN        Current Status/Progress  Goal   Weekly Team Focus   Medical     cervical myelpahty s/p C3-6 fusion. incomplete SCI. neruogenic bowel and bladder  neurological improvement  bowel and bladder mgt, skin care, pain control    Bowel/Bladder     in & out cath q6-8 hrs, incontinent LBM 5/28  able to void, continent  monitor   Swallow/Nutrition/ Hydration     Full liquid diet, Supervision-Min A for use of strategies   Min A   tolerance of current diet, use of swallow strateiges, possible trials of upgraded textures    ADL's     Total A LB dressing; max- total A UB dressing; Total A functional transfers using sliding board; max- total self feeding and grooming   Min A overall  Pain management, ADL re-training; sitting balance    Mobility     Total A for bed mobility, slideboard transfers, w/c mobility in tilt in space, standing  Min A overall  Pain management, balance/trunk control, transfers and WB through LE   Communication     Supervision-Min A  Mod I  increased use of speech intelligibility strategies    Safety/Cognition/ Behavioral Observations  Mod A  Min A  problem solving with functional tasks    Pain     percocet 1-2 tab q4 hrs PRN, scheduled Lyrica. Requires percocet only BID most days  < 2      Skin     neck incisions with dermabond, cervical collar   free of skin breakdown & infection   monitor skin    Rehab Goals Patient on target to meet rehab goals: Yes *See Care Plan and progress notes for long and short-term goals.    Barriers to Discharge:  level of injury, severity      Possible Resolutions to Barriers:   continued education, assistance at home for bowel/bladder/care     Discharge Planning/Teaching Needs:   Home with sister and brother-in-law who can provide 24/7 assistance.  to be scheduled    Team Discussion:    currrently max - total assistance with PT/OT.  Still on full liquid and not ready to upgrade.  Picking up on mild cognitive deficits i.e.memory, money management and recall.   Significantly weak and anticipate longer LOS>   Revisions to Treatment Plan:    None    Continued Need for Acute Rehabilitation Level of Care: The patient requires daily medical management by a physician with specialized training in physical medicine and rehabilitation for the following conditions: Daily direction of a multidisciplinary physical rehabilitation program to ensure safe treatment while eliciting the highest outcome that is of practical value to the patient.: Yes Daily medical management of patient stability for increased activity during participation in an intensive rehabilitation regime.: Yes Daily analysis of laboratory values and/or radiology reports with any subsequent need for medication adjustment of medical intervention for : Post surgical problems;Urological problems;Wound care problems;Neurological problems  Suly Vukelich 12/26/2015, 3:19 PM

## 2015-12-26 NOTE — Progress Notes (Signed)
VASCULAR LAB PRELIMINARY  PRELIMINARY  PRELIMINARY  PRELIMINARY   Bilateral lower extremity venous duplex completed.     Bilateral:  No evidence of DVT, superficial thrombosis, or Baker's Cyst.  Dwana Curd, RN results.  Janifer Adie, RVT, RDMS 12/26/2015, 6:43 PM

## 2015-12-26 NOTE — Progress Notes (Signed)
Occupational Therapy Session Note  Patient Details  Name: [name] MRN: [id number] Date of Birth: November 04, 1945  Today's Date: 12/26/2015 OT Individual Time: [id number] OT Individual Time Calculation (min): 75 min    Short Term Goals: Week 1:  OT Short Term Goal 1 (Week 1): Pt will be able to maintain sitting balance EOB with min A. OT Short Term Goal 2 (Week 1): Pt will be able to don a shirt with mod A. OT Short Term Goal 3 (Week 1): Pt will have improved grasp strength to hold washcloth to wash UB with mod A. OT Short Term Goal 4 (Week 1): Pt will be able to roll in bed with mod A to assist caregivers with LB dressing.  Skilled Therapeutic Interventions/Progress Updates:    Pt seen for OT session focusing on ADL re-training and UE strengthening/ ROM. Pt in supine upon arrival, voicing "over 100" pain in head and neck. RN aware and medication administered. LB dressing completed total A in supine, pt able to lift R LE to assist with threading LE into pants. She required total A to roll until she was able to hook elbow over bed rail and then able to assist with rolling.  She transferred to EOB with max- total A and total A slide board completed to recliner with +2 for safety.  In chair, pt completed UB bathing/dressing. Hand over hand and steadying assist provided while pt washed UB using bath mit. Attempted UB dressing in sitting position today vs supine as done in previous sessions, however, change in position did not equate to more independence with task, requiring total A for all aspects of dressing task. In chair, completed B ROM at shoulder and elbow. Dorsal wrist support donned and worked on self feeding, able to assist minimally with elbow flexion/ extension. She was unable to tolerate full shoulder flexion due to pain in neck.  Pt left seated in recliner at end of session, all needs in reach.   Therapy Documentation Precautions:  Precautions Precautions: Fall,  Cervical Required Braces or Orthoses: Cervical Brace Cervical Brace: Hard collar, At all times Restrictions Weight Bearing Restrictions: No Pain: Pain Assessment Pain Score: 10-Worst pain ever Pain Type: Acute pain Pain Location: Neck Pain Descriptors / Indicators: Aching Pain Intervention(s): Medication (See eMAR);RN made aware;Repositioned  See Function Navigator for Current Functional Status.   Therapy/Group: Individual Therapy  Lewis, Jini Horiuchi C 12/26/2015, 7:07 AM

## 2015-12-26 NOTE — Progress Notes (Signed)
Subjective/Complaints: Neck and shoulder pain---aching/dull. Also having tingling/burning pain in arms and legs     Review of systems denies any breathing problems no chest pain no shortness of breath, positive nausea,, negative vomiting negative abdominal pain.  Objective: Vital Signs: Blood pressure 164/83, pulse 72, temperature 98.5 F (36.9 C), temperature source Oral, resp. rate 17, height '5\' 9"'  (1.753 m), last menstrual period 03/31/2004, SpO2 97 %. No results found. Results for orders placed or performed during the hospital encounter of 12/21/15 (from the past 72 hour(s))  CBC WITH DIFFERENTIAL     Status: Abnormal   Collection Time: 12/24/15  4:55 AM  Result Value Ref Range   WBC 7.4 4.0 - 10.5 K/uL   RBC 2.89 (L) 3.87 - 5.11 MIL/uL   Hemoglobin 8.9 (L) 12.0 - 15.0 g/dL   HCT 26.9 (L) 36.0 - 46.0 %   MCV 93.1 78.0 - 100.0 fL   MCH 30.8 26.0 - 34.0 pg   MCHC 33.1 30.0 - 36.0 g/dL   RDW 12.2 11.5 - 15.5 %   Platelets 171 150 - 400 K/uL   Neutrophils Relative % 83 %   Neutro Abs 6.2 1.7 - 7.7 K/uL   Lymphocytes Relative 10 %   Lymphs Abs 0.7 0.7 - 4.0 K/uL   Monocytes Relative 7 %   Monocytes Absolute 0.5 0.1 - 1.0 K/uL   Eosinophils Relative 0 %   Eosinophils Absolute 0.0 0.0 - 0.7 K/uL   Basophils Relative 0 %   Basophils Absolute 0.0 0.0 - 0.1 K/uL  Comprehensive metabolic panel     Status: Abnormal   Collection Time: 12/24/15  4:55 AM  Result Value Ref Range   Sodium 135 135 - 145 mmol/L   Potassium 4.3 3.5 - 5.1 mmol/L   Chloride 99 (L) 101 - 111 mmol/L   CO2 32 22 - 32 mmol/L   Glucose, Bld 96 65 - 99 mg/dL   BUN 17 6 - 20 mg/dL   Creatinine, Ser 0.53 0.44 - 1.00 mg/dL   Calcium 8.8 (L) 8.9 - 10.3 mg/dL   Total Protein 5.5 (L) 6.5 - 8.1 g/dL   Albumin 2.5 (L) 3.5 - 5.0 g/dL   AST 27 15 - 41 U/L   ALT 33 14 - 54 U/L   Alkaline Phosphatase 162 (H) 38 - 126 U/L   Total Bilirubin 0.8 0.3 - 1.2 mg/dL   GFR calc non Af Amer >60 >60 mL/min   GFR calc Af  Amer >60 >60 mL/min    Comment: (NOTE) The eGFR has been calculated using the CKD EPI equation. This calculation has not been validated in all clinical situations. eGFR's persistently <60 mL/min signify possible Chronic Kidney Disease.    Anion gap 4 (L) 5 - 15     General: No acute distress Mood and affect are flat Heart: Regular rate and rhythm no rubs murmurs or extra sounds Lungs: Clear to auscultation, breathing unlabored, no rales or wheezes Abdomen: Positive bowel sounds, soft nontender to palpation, nondistended Extremities: No clubbing, cyanosis, or edema Skin: No evidence of breakdown, no evidence of rash Neurologic: Cranial nerves II through XII intact, motor strength is 3-/5 in bilateral deltoid, bicep,trace  tricep,  2- grip, 1 to 2-/5 hip flexor, knee extensors, ankle dorsiflexor and plantar flexor, trace right toe flexion right moving more than left Sensory exam absent in bilateral lower extremities to light touch  Musculoskeletal: Full passive  range of motion in all 4 extremities. No joint swelling  Assessment/Plan: 1. 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 Physiatrist is providing close team supervision and 24 hour management of active medical problems listed below. Physiatrist and rehab team continue to assess barriers to discharge/monitor patient progress toward functional and medical goals. FIM: Function - Bathing Position: Bed Body parts bathed by patient: Right arm, Left arm, Abdomen, Chest, Left upper leg, Right upper leg Body parts bathed by helper: Right lower leg, Left lower leg Bathing not applicable: Front perineal area, Buttocks (Performed by nursing) Assist Level: Touching or steadying assistance(Pt > 75%) (hand over hand assist using bath mit)  Function- Upper Body Dressing/Undressing What is the patient wearing?:  Pull over shirt/dress Pull over shirt/dress - Perfomed by patient: Thread/unthread right sleeve, Thread/unthread left sleeve Pull over shirt/dress - Perfomed by helper: Put head through opening, Pull shirt over trunk Assist Level: 2 helpers Function - Lower Body Dressing/Undressing What is the patient wearing?: Pants, [employer], Socks Position: Bed Pants- Performed by helper: Thread/unthread right pants leg, Thread/unthread left pants leg, Pull pants up/down Non-skid slipper socks- Performed by helper: Don/doff right sock, Don/doff left sock TED Hose - Performed by helper: Don/doff right TED hose, Don/doff left TED hose Assist for footwear: Dependant Assist for lower body dressing: 2 Helpers  Function - Toileting Toileting activity did not occur: No continent bowel/bladder event Toileting steps completed by patient: Adjust clothing prior to toileting, Performs perineal hygiene, Adjust clothing after toileting (per Berkley Harvey, Nt report) Assist level: Two helpers (Per Alvin, NT report)  Function - Air cabin crew transfer activity did not occur: Safety/medical concerns  Function - Chair/bed transfer Chair/bed transfer method: Lateral scoot Chair/bed transfer assist level: 2 helpers Chair/bed transfer assistive device: Sliding board Chair/bed transfer details: Manual facilitation for placement, Manual facilitation for weight shifting, Manual facilitation for weight bearing, Verbal cues for safe use of DME/AE, Verbal cues for precautions/safety, Verbal cues for technique  Function - Locomotion: Wheelchair Will patient use wheelchair at discharge?: Yes Type: Manual Wheelchair activity did not occur: Safety/medical concerns Assist Level: Dependent (Pt equals 0%) Wheel 50 feet with 2 turns activity did not occur: Safety/medical concerns Assist Level: Dependent (Pt equals 0%) Wheel 150 feet activity did not occur: Safety/medical concerns Assist Level: Dependent (Pt equals  0%) Function - Locomotion: Ambulation Ambulation activity did not occur: Safety/medical concerns Walk 10 feet activity did not occur: Safety/medical concerns Walk 50 feet with 2 turns activity did not occur: Safety/medical concerns Walk 150 feet activity did not occur: Safety/medical concerns Walk 10 feet on uneven surfaces activity did not occur: Safety/medical concerns  Function - Comprehension Comprehension: Auditory Comprehension assist level: Understands basic 90% of the time/cues < 10% of the time  Function - Expression Expression: Verbal Expression assist level: Expresses basic 75 - 89% of the time/requires cueing 10 - 24% of the time. Needs helper to occlude trach/needs to repeat words.  Function - Social Interaction Social Interaction assist level: Interacts appropriately 90% of the time - Needs monitoring or encouragement for participation or interaction.  Function - Problem Solving Problem solving assist level: Solves basic 50 - 74% of the time/requires cueing 25 - 49% of the time  Function - Memory Memory assist level: Recognizes or recalls 50 - 74% of the time/requires cueing 25 - 49% of the time Patient normally able to recall (first 3 days only): Current season, Location of own room, Staff names and faces, That he  or she is in a hospital  Medical Problem List and Plan: 1.  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 Cervical collar at all times   - CIR PT and OT  -team conference today  2.  DVT Prophylaxis/Anticoagulation: Subcutaneous heparin. Monitor platelet counts of any signs of bleeding 3. Pain Management/fibromyalgia/chronic back pain: Chronic prednisone 10 mg twice a day,  Oxycodone and Zanaflex as needed,    - Lyrica 54m ---increase to TID for nerve pain  -schedule robaxin for neck/shoulder girdle pain 4. Mood: Xanax 0.5 mg twice a day 5.  Neuropsych: This patient is capable of making decisions on her own behalf. 6. Skin/Wound Care: Routine skin checks 7. Fluids/Electrolytes/Nutrition:  Meal intake 50-100% 8. Hypertension. Hydrochlorothiazide 25 mg daily, lisinopril 20 mg daily. Fair control 9. Constipation. Laxative assistance 10. Hyperlipidemia.Lovaza 11. Neurogenic bladder continue ICP program. Has no sense of bladder filling 12. Hypoalbuminemia--added pro-stat  LOS (Days) 5 A FACE TO FACE EVALUATION WAS PERFORMED  SWARTZ,ZACHARY T 12/26/2015, 8:46 AM

## 2015-12-26 NOTE — Progress Notes (Signed)
Social Work Patient ID: [name], female   DOB: 02-28-1946, 70 y.o.   MRN: [id number]  Have reviewed team conference with pt and sister, [name] (via phone).  Pt agreeable with targeted d/c date 6/23 and min assist goals.  She appears very fatigued and does not easily engage this afternoon.  I then contacted sister who is aware of targeted d/c date, however, expressed a lot of concern about her abilities to meet pt's care needs.  She reports that she does NOT feel that she could catheterize pt - "no, I'm not going to do that...".  She also reports that she cannot provide much physical assistance "... It's only me."  She asks if pt is walking yet and attempted to explain that we are currently working at a much more basic level of sitting balance.  Told sister that ambulation goals are very TBD.  Given sister's reaction and questions, I did discuss the option of SNF to which she states, "If she can't come home she is gonna be so mad."  Discussed that we would have to pursue this if family cannot meet assist needs.  Also discussed need to complete Medicaid application as pt only has Medicare (which would only cover 20 days.)  I plan to meet with sister on Friday to review this application as it appears pretty clear that SNF will be our plan.  Continue to follow.  Kabeer Hoagland, LCSW

## 2015-12-26 NOTE — Plan of Care (Signed)
Problem: RH PAIN MANAGEMENT Goal: RH STG PAIN MANAGED AT OR BELOW PT'S PAIN GOAL < 4  Outcome: Not Progressing Rating pain between 10 and 7

## 2015-12-26 NOTE — Progress Notes (Signed)
Speech Language Pathology Daily Session Note  Patient Details  Name: [name] MRN: [id number] Date of Birth: 1945-10-19  Today's Date: 12/26/2015 SLP Individual Time: 1100-1200 SLP Individual Time Calculation (min): 60 min  Short Term Goals: Week 1: SLP Short Term Goal 1 (Week 1): Pt will complete mildly complex reasoning tasks with 90% accuracy and Mod A cues.  SLP Short Term Goal 2 (Week 1): Pt will increase intelligibility at the sentence level for 90% intelligibity and Min A  for use of compensatory strategies.  SLP Short Term Goal 3 (Week 1): Pt will complete money management tasks with 90% accuracy and Mod A cues.  SLP Short Term Goal 4 (Week 1): Pt will complete medication management tasks with 90% accuracy and Mod A cues.  SLP Short Term Goal 5 (Week 1): Pt will consume trials of puree without s/s of aspiration/dysphagia with Min A for use of compensatory strategies.   Skilled Therapeutic Interventions: Skilled ST session focused on dysphagia and cognition goals. SLP facilitated session by providing Min A verbal cues for completion of mildly complex reasoning tasks with 90%. Pt was able to recall intelligibility strategies with Min A verbal cues and implemented with Mod I. Pt was intelligible at the simple conversation level with Min A cues faded to supervision cues. Pt consumed 3 bolus of puree with good oral clearing and no overt s/s of aspiration. Pt reported "soreness" to left throat area when swallowing puree but didn't report any sensation of the bolus getting stuck. Pt able to independently recall 1 compensatory swallow strategy (single sip) and implemented independently. Pt with mild throat clearing following last 2 sips of thin liquids. Pt's voice remained clear. Pt left in recliner with all needs within reach. Continue current plan of care.    Function:  Eating Eating   Modified Consistency Diet: Yes Eating Assist Level: Helper feeds patient     Helper  Scoops Food on Utensil: Every scoop Helper Brings Food to Mouth: Every scoop   Cognition Comprehension Comprehension assist level: Understands basic 90% of the time/cues < 10% of the time  Expression   Expression assist level: Expresses basic 75 - 89% of the time/requires cueing 10 - 24% of the time. Needs helper to occlude trach/needs to repeat words.  Social Interaction Social Interaction assist level: Interacts appropriately 90% of the time - Needs monitoring or encouragement for participation or interaction.  Problem Solving Problem solving assist level: Solves basic 50 - 74% of the time/requires cueing 25 - 49% of the time  Memory Memory assist level: Recognizes or recalls 50 - 74% of the time/requires cueing 25 - 49% of the time    Pain    Therapy/Group: Individual Therapy  Linna Thebeau 12/26/2015, 12:02 PM

## 2015-12-26 NOTE — Progress Notes (Signed)
Occupational Therapy Session Note  Patient Details  Name: [name] MRN: [id number] Date of Birth: 1945-09-25  Today's Date: 12/26/2015 OT Individual Time: 1300-1356 OT Individual Time Calculation (min): 56 min    Skilled Therapeutic Interventions/Progress Updates:    Pt sitting in recliner to start session.  Worked on [employer] exercises as listed below.  Pt with slightly greater strength noted in the RUE distally compared to the LUE.  Worked on sitting balance EOC for short intervals of 30 seconds to 1 min with pt requesting to rest after this time period.  Max assist for static sitting balance EOC with emphasis on pt using her hands on her lap to help prevent forward LOB.  Transferred to the bed from recliner with total assist using sliding board to complete session.  Pt left in supine with NT present to complete in and out cath.      Therapy Documentation Precautions:  Precautions Precautions: Fall, Cervical Required Braces or Orthoses: Cervical Brace Cervical Brace: Hard collar, At all times Restrictions Weight Bearing Restrictions: No  Pain: Pain Assessment Pain Assessment: Faces Faces Pain Scale: Hurts little more Pain Type: Acute pain Pain Location: Neck Pain Orientation: Posterior Pain Intervention(s): Repositioned;Emotional support;RN made aware  Exercises: General Exercises - Upper Extremity Shoulder Flexion: AAROM;Both;10 reps;Seated Elbow Flexion: AAROM;Both;10 reps;Seated Elbow Extension: 10 reps;AAROM;Seated;Both Wrist Extension: AAROM;10 reps;Sidelying;Both Digit Composite Flexion: AAROM;Both;10 reps;Seated Composite Extension: AAROM;Both;10 reps;Seated  See Function Navigator for Current Functional Status.   Therapy/Group: Individual Therapy  Jerrian Mells OTR/L 12/26/2015, 3:43 PM

## 2015-12-26 NOTE — Progress Notes (Signed)
Physical Therapy Session Note  Patient Details  Name: [name] MRN: [id number] Date of Birth: Jan 01, 1946  Today's Date: 12/26/2015 PT Individual Time: 0159-9689 PT Individual Time Calculation (min): 59 min   Short Term Goals: Week 1:  PT Short Term Goal 1 (Week 1): Pt will increase rolling with side rails to mod A.  PT Short Term Goal 2 (Week 1): Pt willl increase transfers supine to edge of bed, edge of bed to supine to max A.  PT Short Term Goal 3 (Week 1): Pt will increase transfers bed to chair to max A.  PT Short Term Goal 4 (Week 1): Pt will increase ambulation with appropriate assistive device to max A about 10 feet.  PT Short Term Goal 5 (Week 1): Pt will propel w/c about 50 feet with min A.   Skilled Therapeutic Interventions/Progress Updates:    Pt received resting in bed, reports general pain but does not rate, and reports extreme fatigue.  Pt initially declining therapy, but agreeable to sit in recliner while NT changed bed to air mattress.    Pt transitions supine>sit with max assist for bringing LEs to floor and bringing trunk upright.  Slide board transfer to recliner with total assist to place board and perform transfer.  PT provided pt education throughout transfer on head/hips relationship and forward weight shift.    Pt taken to therapy gym in recliner and PT instructed in BLE ankle pumps with AAROM on LLE, quad sets, and SAQ for NMR.  PT providing education throughout exercises on correct form, carry over principle, and visual feedback of success (i.e. Seeing quad contract or seeing LE extend during SAQ).  Pt verbalized understanding.  PT performed AAROM to bilat elbow and wrists.    Pt returned to room at end of session and pt performs slide board transfer back to bed with total assist.  Pt positioned to comfort in supine with call bell in reach and needs met.   Therapy Documentation Precautions:  Precautions Precautions: Fall, Cervical Required Braces  or Orthoses: Cervical Brace Cervical Brace: Hard collar, At all times Restrictions Weight Bearing Restrictions: No   See Function Navigator for Current Functional Status.   Therapy/Group: Individual Therapy  Earnest Conroy Penven-Crew 12/26/2015, 4:45 PM

## 2015-12-27 ENCOUNTER — Inpatient Hospital Stay (HOSPITAL_COMMUNITY): Payer: Medicare Other

## 2015-12-27 ENCOUNTER — Inpatient Hospital Stay (HOSPITAL_COMMUNITY): Payer: Medicare Other | Admitting: Occupational Therapy

## 2015-12-27 ENCOUNTER — Inpatient Hospital Stay (HOSPITAL_COMMUNITY): Payer: Medicare Other | Admitting: Speech Pathology

## 2015-12-27 NOTE — Progress Notes (Signed)
Speech Language Pathology Daily Session Note  Patient Details  Name: [name] MRN: [id number] Date of Birth: 1946-04-18  Today's Date: 12/27/2015 SLP Individual Time: 1400-1500 SLP Individual Time Calculation (min): 60 min  Short Term Goals: Week 1: SLP Short Term Goal 1 (Week 1): Pt will complete mildly complex reasoning tasks with 90% accuracy and Mod A cues.  SLP Short Term Goal 2 (Week 1): Pt will increase intelligibility at the sentence level for 90% intelligibity and Min A  for use of compensatory strategies.  SLP Short Term Goal 3 (Week 1): Pt will complete money management tasks with 90% accuracy and Mod A cues.  SLP Short Term Goal 4 (Week 1): Pt will complete medication management tasks with 90% accuracy and Mod A cues.  SLP Short Term Goal 5 (Week 1): Pt will consume trials of puree without s/s of aspiration/dysphagia with Min A for use of compensatory strategies.   Skilled Therapeutic Interventions: Skilled treatment session focused on dysphagia and cognition goals. SLP facilitated session by providing trials of thin liquids via straw. Pt wil mild cough x 2 with consumption of ~ 3 oz. Pt politely refused any trials of puree stating that she was too full from lunch. Pt was ~50 intelligible at the simple conversation and Mod A verbal cues for use of compensatory strategies. Pt was fatigued during session as stated that she was talking as loud as she could. Pt completed mildly complex reasoning tasks with 80% accuracy with Mod A verbal cues fading to Min A verbal cues. Pt was left in bed with all needs within reach. Continue plan of care.   Function:  Eating Eating   Modified Consistency Diet: No (Thin liquids) Eating Assist Level: Helper feeds patient     Helper Scoops Food on Utensil: Every Photographer Food to Mouth: Every scoop   Cognition Comprehension Comprehension assist level: Understands basic 90% of the time/cues < 10% of the time  Expression    Expression assist level: Expresses basic 75 - 89% of the time/requires cueing 10 - 24% of the time. Needs helper to occlude trach/needs to repeat words.  Social Interaction Social Interaction assist level: Interacts appropriately 90% of the time - Needs monitoring or encouragement for participation or interaction.  Problem Solving Problem solving assist level: Solves basic 50 - 74% of the time/requires cueing 25 - 49% of the time  Memory Memory assist level: Recognizes or recalls 50 - 74% of the time/requires cueing 25 - 49% of the time    Pain    Therapy/Group: Individual Therapy  Anaily Ashbaugh 12/27/2015, 3:13 PM

## 2015-12-27 NOTE — Progress Notes (Signed)
Physical Therapy Session Note  Patient Details  Name: [name] MRN: [id number] Date of Birth: 15-May-1946  Today's Date: 12/27/2015 PT Individual Time: 1020-1115 PT Individual Time Calculation (min): 55 min   Short Term Goals: Week 1:  PT Short Term Goal 1 (Week 1): Pt will increase rolling with side rails to mod A.  PT Short Term Goal 2 (Week 1): Pt willl increase transfers supine to edge of bed, edge of bed to supine to max A.  PT Short Term Goal 3 (Week 1): Pt will increase transfers bed to chair to max A.  PT Short Term Goal 4 (Week 1): Pt will increase ambulation with appropriate assistive device to max A about 10 feet.  PT Short Term Goal 5 (Week 1): Pt will propel w/c about 50 feet with min A.   Skilled Therapeutic Interventions/Progress Updates:   Pt missed 5 min of session due to fatigue and initially declining therapy. With encouragement and education, pt agreeable to OOB and therex in recliner. PT donned Tedhose prior to OOB and pt declined putting on clothing at this time. Required total to max assist for bed mobility and cues for technique to get to EOB and mod assist for static sitting EOB to prepare for transfer. Total assist with slideboard to transfer into recliner with cues for head hips relationship. Instructed in active assisted and PROM to BLE for ankle DF/PF, LAQ, hip abduction and quad sets with focus on motor activation (pt able to activate more on RLE; minimal on L). AAROM to BUE for elbow flexion to simulate self feeding. Problem solved positioning of soft touch call bell to increase ability to activate for assist. Pt limited this session by fatigue.   Therapy Documentation Precautions:  Precautions Precautions: Fall, Cervical Required Braces or Orthoses: Cervical Brace Cervical Brace: Hard collar, At all times Restrictions Weight Bearing Restrictions: No   Pain:  Reports generalized pain and overall fatigue.    See Function Navigator for Current  Functional Status.   Therapy/Group: Individual Therapy  Canary Brim Ivory Broad, PT, DPT  12/27/2015, 12:23 PM

## 2015-12-27 NOTE — Progress Notes (Signed)
Physical Therapy Session Note  Patient Details  Name: [name] MRN: [id number] Date of Birth: [birth year]  Today's Date: 12/27/2015 PT Individual Time: 1300-1400 PT Individual Time Calculation (min): 60 min   Short Term Goals: Week 1:  PT Short Term Goal 1 (Week 1): Pt will increase rolling with side rails to mod A.  PT Short Term Goal 2 (Week 1): Pt willl increase transfers supine to edge of bed, edge of bed to supine to max A.  PT Short Term Goal 3 (Week 1): Pt will increase transfers bed to chair to max A.  PT Short Term Goal 4 (Week 1): Pt will increase ambulation with appropriate assistive device to max A about 10 feet.  PT Short Term Goal 5 (Week 1): Pt will propel w/c about 50 feet with min A.   Skilled Therapeutic Interventions/Progress Updates:   Session focused on getting patient appropriate w/c and adjusting it for proper fit (did not have enough time, but would like to adjust height of specialty back to improve postural support) to increase sitting tolerance, ability to have dependent pressure relief, head/neck support, and postural control. Pt performed slideboard transfer with total A (+2 present to stabilize equipment) to transfer from recliner into new tilt in space w/c. Repositioned with total assist, educated on pressure relief, and educated on purpose of specialty cushion and w/c for skin protection as pt stated she would prefer to just sit in the recliner. Handoff to SLP at end of session.  Therapy Documentation Precautions:  Precautions Precautions: Fall, Cervical Required Braces or Orthoses: Cervical Brace Cervical Brace: Hard collar, At all times Restrictions Weight Bearing Restrictions: No  Pain: Generalized discomfort and fatigue. Requesting to wait on pain medication until after her next session.      See Function Navigator for Current Functional Status.   Therapy/Group: Individual Therapy  Canary Brim Ivory Broad, PT,  DPT  12/27/2015, 3:35 PM

## 2015-12-27 NOTE — Progress Notes (Signed)
Suppository given, but patient required disimpaction. Medium hard stool disimpacted and patient said she feels better. Continue with plan of care. Bladder scan for 220 ml at this time. Cath due at 2330. Patient bathed. Continue with plan of care Timoteo Ace, RN

## 2015-12-27 NOTE — Progress Notes (Addendum)
Subjective/Complaints: Didn't sleep well because of neck pain. Otherwise denies new complaints.      Review of systems denies any breathing problems no chest pain no shortness of breath, positive nausea,, negative vomiting negative abdominal pain.  Objective: Vital Signs: Blood pressure 118/68, pulse 60, temperature 98 F (36.7 C), temperature source Oral, resp. rate 18, height 5\' 9"  (1.753 m), weight 61.8 kg (136 lb 3.9 oz), last menstrual period 03/31/2004, SpO2 98 %. No results found. Results for orders placed or performed during the hospital encounter of 12/21/15 (from the past 72 hour(s))  Urinalysis, Routine w reflex microscopic (not at [facility])     Status: Abnormal   Collection Time: 12/26/15  2:30 PM  Result Value Ref Range   Color, Urine RED (A) YELLOW    Comment: BIOCHEMICALS MAY BE AFFECTED BY COLOR   APPearance TURBID (A) CLEAR   Specific Gravity, Urine 1.023 1.005 - 1.030   pH 7.0 5.0 - 8.0   Glucose, UA NEGATIVE NEGATIVE mg/dL   Hgb urine dipstick LARGE (A) NEGATIVE   Bilirubin Urine NEGATIVE NEGATIVE   Ketones, ur 15 (A) NEGATIVE mg/dL   Protein, ur 100 (A) NEGATIVE mg/dL   Nitrite NEGATIVE NEGATIVE   Leukocytes, UA LARGE (A) NEGATIVE  Urine microscopic-add on     Status: Abnormal   Collection Time: 12/26/15  2:30 PM  Result Value Ref Range   Squamous Epithelial / LPF NONE SEEN NONE SEEN   WBC, UA TOO NUMEROUS TO COUNT 0 - 5 WBC/hpf   RBC / HPF TOO NUMEROUS TO COUNT 0 - 5 RBC/hpf   Bacteria, UA MANY (A) NONE SEEN     General: No acute distress Mood and affect are flat Heart: Regular rate and rhythm no rubs murmurs or extra sounds Lungs: Clear to auscultation, breathing unlabored, no rales or wheezes Abdomen: Positive bowel sounds, soft nontender to palpation, nondistended Extremities: No clubbing, cyanosis, or edema Skin: No evidence of breakdown, no evidence of rash Neurologic: Cranial nerves II through XII intact, motor strength is 3-/5 in bilateral  deltoid, bicep,trace  tricep,  2- grip, 1 to 2-/5 hip flexor, knee extensors, ankle dorsiflexor and plantar flexor, trace right toe flexion right moving more than left Sensory exam absent in bilateral lower extremities to light touch  Musculoskeletal: Full passive  range of motion in all 4 extremities. No joint swelling   Assessment/Plan: 1. 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 Physiatrist is providing close team supervision and 24 hour management of active medical problems listed below. Physiatrist and rehab team continue to assess barriers to discharge/monitor patient progress toward functional and medical goals. FIM: Function - Bathing Position: Other (comment) (Recliner) Body parts bathed by patient: Right arm, Left arm, Chest, Abdomen (Hand over hand assist) Body parts bathed by helper: Front perineal area, Buttocks Bathing not applicable: Right upper leg, Left upper leg, Right lower leg, Left lower leg, Back Assist Level: Touching or steadying assistance(Pt > 75%) (hand over hand assist using bath mit)  Function- Upper Body Dressing/Undressing What is the patient wearing?: Pull over shirt/dress Pull over shirt/dress - Perfomed by patient: Thread/unthread right sleeve, Thread/unthread left sleeve Pull over shirt/dress - Perfomed by helper: Thread/unthread right sleeve, Thread/unthread left sleeve, Put head through opening, Pull shirt over trunk Assist Level: 2 helpers Function - Lower Body Dressing/Undressing What is the patient wearing?: Pants, Maryln Manuel, Socks Position: Bed Pants- Performed by helper:  Thread/unthread right pants leg, Thread/unthread left pants leg, Pull pants up/down Non-skid slipper socks- Performed by helper: Don/doff right sock, Don/doff left sock TED Hose - Performed by helper: Don/doff right TED hose, Don/doff left TED  hose Assist for footwear: Dependant Assist for lower body dressing: 2 Helpers  Function - Toileting Toileting activity did not occur: No continent bowel/bladder event Toileting steps completed by patient: Adjust clothing prior to toileting, Performs perineal hygiene, Adjust clothing after toileting (per Berkley Harvey, Nt report) Assist level: Two helpers (Per Leonard, NT report)  Function - Air cabin crew transfer activity did not occur: Safety/medical concerns  Function - Chair/bed transfer Chair/bed transfer method: Lateral scoot Chair/bed transfer assist level: Total assist (Pt < 25%) Chair/bed transfer assistive device: Sliding board Chair/bed transfer details: Manual facilitation for placement, Manual facilitation for weight shifting, Manual facilitation for weight bearing, Verbal cues for safe use of DME/AE, Verbal cues for precautions/safety, Verbal cues for technique  Function - Locomotion: Wheelchair Will patient use wheelchair at discharge?: Yes Type: Manual Wheelchair activity did not occur: Safety/medical concerns Assist Level: Dependent (Pt equals 0%) Wheel 50 feet with 2 turns activity did not occur: Safety/medical concerns Assist Level: Dependent (Pt equals 0%) Wheel 150 feet activity did not occur: Safety/medical concerns Assist Level: Dependent (Pt equals 0%) Function - Locomotion: Ambulation Ambulation activity did not occur: Safety/medical concerns Walk 10 feet activity did not occur: Safety/medical concerns Walk 50 feet with 2 turns activity did not occur: Safety/medical concerns Walk 150 feet activity did not occur: Safety/medical concerns Walk 10 feet on uneven surfaces activity did not occur: Safety/medical concerns  Function - Comprehension Comprehension: Auditory Comprehension assist level: Understands basic 90% of the time/cues < 10% of the time  Function - Expression Expression: Verbal Expression assist level: Expresses basic 75 - 89% of the  time/requires cueing 10 - 24% of the time. Needs helper to occlude trach/needs to repeat words.  Function - Social Interaction Social Interaction assist level: Interacts appropriately 90% of the time - Needs monitoring or encouragement for participation or interaction.  Function - Problem Solving Problem solving assist level: Solves basic 50 - 74% of the time/requires cueing 25 - 49% of the time  Function - Memory Memory assist level: Recognizes or recalls 50 - 74% of the time/requires cueing 25 - 49% of the time Patient normally able to recall (first 3 days only): Current season, Location of own room, Staff names and faces, That he or she is in a hospital  Medical Problem List and Plan: 1.  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 Cervical collar at all times   - CIR PT and OT  2.  DVT Prophylaxis/Anticoagulation: dopplers negative  -now on sq lovenox 3. Pain Management/fibromyalgia/chronic back pain: Chronic prednisone 10 mg twice a day,  Oxycodone and Zanaflex as needed,    - Lyrica 50mg  ---increased to TID for nerve pain  -schedule robaxin for neck/shoulder girdle pain 4. Mood: Xanax 0.5 mg twice a day 5. Neuropsych: This patient is capable of making decisions on her own behalf. 6. Skin/Wound Care: Routine skin checks 7. Fluids/Electrolytes/Nutrition:  Meal intake 50-100% 8. Hypertension. Hydrochlorothiazide 25 mg daily, lisinopril 20 mg daily. Fair control 9. Constipation. Laxative assistance 10. Hyperlipidemia.Lovaza 11. Neurogenic bladder continue ICP program. Has no sense of bladder filling 12. Hypoalbuminemia--added pro-stat  LOS (Days) 6 A FACE TO FACE EVALUATION WAS PERFORMED  SWARTZ,ZACHARY T 12/27/2015, 7:57 AM

## 2015-12-27 NOTE — Plan of Care (Signed)
Problem: SCI BOWEL ELIMINATION Goal: RH STG MANAGE BOWEL WITH ASSISTANCE STG Manage Bowel with mod Assistance.  Outcome: Not Progressing Pt required disimpaction    Goal: RH STG SCI MANAGE BOWEL PROGRAM W/ASSIST OR AS APPROPRIATE STG SCI Manage bowel program w/mod assist or as appropriate.  Outcome: Not Progressing Required suppository and disimpaction  Problem: RH PAIN MANAGEMENT Goal: RH STG PAIN MANAGED AT OR BELOW PT'S PAIN GOAL < 4  Outcome: Not Progressing Patient rates pain a 10/10

## 2015-12-27 NOTE — Progress Notes (Signed)
Occupational Therapy Note  Patient Details  Name: [name] MRN: [id number] Date of Birth: 03-25-1946  Today's Date: 12/27/2015 OT Missed Time: 65 Minutes Missed Time Reason: Patient fatigue;Pain  Arrived at 7:30 for OT session, but pt voiced pain and fatigue having not slept well overnight. RN present administering medication. OT returned at 8:30 however pt continued to decline treatment. Will continue to follow up as able.     Lewis, Batsheva Stevick C 12/27/2015, 1:46 PM

## 2015-12-28 ENCOUNTER — Inpatient Hospital Stay (HOSPITAL_COMMUNITY): Payer: Medicare Other

## 2015-12-28 ENCOUNTER — Inpatient Hospital Stay (HOSPITAL_COMMUNITY): Payer: Medicare Other | Admitting: Occupational Therapy

## 2015-12-28 ENCOUNTER — Inpatient Hospital Stay (HOSPITAL_COMMUNITY): Payer: Medicare Other | Admitting: Speech Pathology

## 2015-12-28 LAB — URINE CULTURE: Culture: >=100000 — AB

## 2015-12-28 NOTE — Progress Notes (Signed)
Occupational Therapy Note  Patient Details  Name: [name] MRN: [id number] Date of Birth: [birth year]  Today's Date: 12/28/2015 OT Individual Time: 1130-1200 OT Individual Time Calculation (min): 30 min   1:1 Pt with c/o "not feeling well" and pain in neck. Requested to sit in recliner however discussed chair is not the best support for her long term and concerned about decr ability to pressure relief. Focus on transfer training. Performed lateral slide board transfer from w/c to bed. Pt's tilt in space w/c with increased floor to seat height so pt's feet not fully touching the floor. In future would recommend a step under feet for support. Pt able to initiate and come forward with min A; mod A to maintaining forward weight shift balance. Pt able to help with weight shifts during transfer but still required max A. Bed mobility with max A; but pt able to help with extra time and min A to position LEs.   Willeen Cass [facility] 12/28/2015, 3:22 PM

## 2015-12-28 NOTE — Progress Notes (Signed)
Physical Therapy Session Note  Patient Details  Name: [name] MRN: [id number] Date of Birth: [birth year]  Today's Date: 12/28/2015   Pt missed 45 min of skilled PT session due to refusal due to fatigue and feeling sick. Discussed with patient the change in therapy schedule ordered by PA to 3 hours per day. Pt reports needing to rest and left with call bell in reach.    Therapy Documentation Precautions:  Precautions Precautions: Fall, Cervical Required Braces or Orthoses: Cervical Brace Cervical Brace: Hard collar, At all times Restrictions Weight Bearing Restrictions: No  See Function Navigator for Current Functional Status.   Canary Brim Ivory Broad, PT, DPT  12/28/2015, 2:30 PM

## 2015-12-28 NOTE — Plan of Care (Signed)
Problem: RH Bed Mobility Goal: LTG Patient will perform bed mobility with assist (PT) LTG: Patient will perform bed mobility with assistance, with/without cues (PT).  Downgraded due to lack of progress ABG  Problem: RH Bed to Chair Transfers Goal: LTG Patient will perform bed/chair transfers w/assist (PT) LTG: Patient will perform bed/chair transfers with assistance, with/without cues (PT).  Downgraded due to lack of progress ABG,  Problem: RH Car Transfers Goal: LTG Patient will perform car transfers with assist (PT) LTG: Patient will perform car transfers with assistance (PT).  Downgraded due to lack of progress ABG  Problem: RH Ambulation Goal: LTG Patient will ambulate in controlled environment (PT) LTG: Patient will ambulate in a controlled environment, # of feet with assistance (PT).  D/c due to lack of progress. Goal: LTG Patient will ambulate in home environment (PT) LTG: Patient will ambulate in home environment, # of feet with assistance (PT).  D/c due to lack of progress  Problem: RH Wheelchair Mobility Goal: LTG Patient will propel w/c in controlled environment (PT) LTG: Patient will propel wheelchair in controlled environment, # of feet with assist (PT)  Downgraded due to lack of progress ABG Goal: LTG Patient will propel w/c in home environment (PT) LTG: Patient will propel wheelchair in home environment, # of feet with assistance (PT).  D/c  Problem: RH Stairs Goal: LTG Patient will ambulate up and down stairs w/assist (PT) LTG: Patient will ambulate up and down # of stairs with assistance (PT)  D/c due to lack of progress ABG  Problem: RH Balance Goal: LTG Patient will maintain dynamic sitting balance (PT) LTG: Patient will maintain dynamic sitting balance with assistance during mobility activities (PT)  Downgraded due to lack of progress ABG Goal: LTG Patient will maintain dynamic standing balance (PT) LTG: Patient will maintain dynamic standing balance with  assistance during mobility activities (PT)  D/c due to lack of progress ABG

## 2015-12-28 NOTE — Progress Notes (Signed)
Physical Therapy Session Note  Patient Details  Name: [name] MRN: [id number] Date of Birth: 04/10/1946  Today's Date: 12/28/2015 PT Co-Treatment Time: 1000-1030 (Session 1000-1100) PT Co-Treatment Time Calculation (min): 30 min  Short Term Goals: Week 1:  PT Short Term Goal 1 (Week 1): Pt will increase rolling with side rails to mod A.  PT Short Term Goal 2 (Week 1): Pt willl increase transfers supine to edge of bed, edge of bed to supine to max A.  PT Short Term Goal 3 (Week 1): Pt will increase transfers bed to chair to max A.  PT Short Term Goal 4 (Week 1): Pt will increase ambulation with appropriate assistive device to max A about 10 feet.  PT Short Term Goal 5 (Week 1): Pt will propel w/c about 50 feet with min A.   Skilled Therapeutic Interventions/Progress Updates:    Co-treatment with OT with focus on functional slideboard transfers, changing w/c and adjusting w/c for appropriate fit and improved postural alignment, overall endurance, and neuro re-ed to address balance/trunk control/limits of stability. Pt very fatigued and required encouragement throughout session. Recommending to decrease therapy schedule and will discuss with primary team and MD. Pt with c/o lightheadedness and nausea during session (BP values in Doc Flow) but they are actually quite high in both supine and seated positions. RN made aware. Pt required total A for all mobility and verbal and tactile cues for weighshifting, sequencing, and initiation during mobility.    Therapy Documentation Precautions:  Precautions Precautions: Fall, Cervical Required Braces or Orthoses: Cervical Brace Cervical Brace: Hard collar, At all times Restrictions Weight Bearing Restrictions: No   Pain: C/o pain in neck and shoulders - premedicated.    See Function Navigator for Current Functional Status.   Therapy/Group: Individual Therapy  Canary Brim Ivory Broad, PT, DPT  12/28/2015, 12:25 PM

## 2015-12-28 NOTE — Progress Notes (Addendum)
Occupational Therapy Session Note  Patient Details  Name: [name] MRN: [id number] Date of Birth: 06/01/1946   Today's Date: 12/28/2015 OT Individual Time: [id number] Individual Time Calculation (min): 60 min    Short Term Goals: Week 1:  OT Short Term Goal 1 (Week 1): Pt will be able to maintain sitting balance EOB with min A. OT Short Term Goal 2 (Week 1): Pt will be able to don a shirt with mod A. OT Short Term Goal 3 (Week 1): Pt will have improved grasp strength to hold washcloth to wash UB with mod A. OT Short Term Goal 4 (Week 1): Pt will be able to roll in bed with mod A to assist caregivers with LB dressing.  Skilled Therapeutic Interventions/Progress Updates:    Pt seen during skilled OT session for ADL activities. Upon entering room pt was supine in bed with sister present. Pt was agreeable to therapy. Pt rolled in bed to don pants requiring max A x 2 for help staying on side while the other OT pulled up pants. Pt dressed upper body in w/c with total A. Pt. Sister had questions regarding pt's discharge disposition, and return of functional abilities so OT educated on possible options and CIR goals.  Pt worked on improving balance by leaning onto lap tray in w/c. At first pt required two OT to gain balance by having one one each side to keep her from falling forward. However,  by the end of the session she only required one OT to help stabilize and was able to hold it for several seconds.  OT educated pt on the importance of balance and putting weight through her shoulders in order to improve balance and prevent shoulder sublaxation. Pt reported understanding. Pt complained of pain in head and neck throughout OT session and required several breaks to lean back in w/c.  Pt was left in room alone with call bell in reach. Pt rated pain 10/10.  Therapy Documentation Precautions:  Precautions Precautions: Fall, Cervical Required Braces or Orthoses: Cervical Brace Cervical  Brace: Hard collar, At all times Restrictions Weight Bearing Restrictions: No   Therapy/Group: Individual Therapy  Matilde Bash 12/28/2015, 9:43 AM

## 2015-12-28 NOTE — Plan of Care (Signed)
Problem: SCI BOWEL ELIMINATION Goal: RH STG MANAGE BOWEL WITH ASSISTANCE STG Manage Bowel with mod Assistance.  Outcome: Not Progressing Total A at this time. Required disimpaction

## 2015-12-28 NOTE — Progress Notes (Signed)
Speech Language Pathology Weekly Progress and Session Note  Patient Details  Name: [name] MRN: [id number] Date of Birth: [birth year]  Beginning of progress report period: Dec 22, 2015 End of progress report period: December 28, 2015  Today's Date: 12/28/2015 SLP Individual Time: [id number] SLP Individual Time Calculation (min): 45 min  Short Term Goals: Week 1: SLP Short Term Goal 1 (Week 1): Pt will complete mildly complex reasoning tasks with 90% accuracy and Mod A cues.  SLP Short Term Goal 1 - Progress (Week 1): Progressing toward goal SLP Short Term Goal 2 (Week 1): Pt will increase intelligibility at the sentence level for 90% intelligibity and Min A  for use of compensatory strategies.  SLP Short Term Goal 2 - Progress (Week 1): Progressing toward goal SLP Short Term Goal 3 (Week 1): Pt will complete money management tasks with 90% accuracy and Mod A cues.  SLP Short Term Goal 3 - Progress (Week 1): Progressing toward goal SLP Short Term Goal 4 (Week 1): Pt will complete medication management tasks with 90% accuracy and Mod A cues.  SLP Short Term Goal 4 - Progress (Week 1): Progressing toward goal SLP Short Term Goal 5 (Week 1): Pt will consume trials of puree without s/s of aspiration/dysphagia with Min A for use of compensatory strategies.  SLP Short Term Goal 5 - Progress (Week 1): Progressing toward goal    New Short Term Goals: Week 2: SLP Short Term Goal 1 (Week 2): Pt will complete mildly complex reasoning tasks with 90% accuracy and Mod A cues.  SLP Short Term Goal 2 (Week 2): Pt will increase intelligibility at the sentence level for 90% intelligibity and Min A  for use of compensatory strategies.  SLP Short Term Goal 3 (Week 2): Pt will complete money management tasks with 90% accuracy and Mod A cues.  SLP Short Term Goal 4 (Week 2): Pt will complete medication management tasks with 90% accuracy and Mod A cues.  SLP Short Term Goal 5 (Week 2): Pt will consume  trials of puree without s/s of aspiration/dysphagia with Min A for use of compensatory strategies.   Weekly Progress Updates:  Pt is making progress toward all of her cognitive-linguistic and swallowing goals. Variable performance noted- this is likely related to fatigue. Will continue with current goals.    Intensity: Minumum of 1-2 x/day, 30 to 90 minutes Frequency: 3 to 5 out of 7 days Duration/Length of Stay: 14 days Treatment/Interventions: Cognitive remediation/compensation;Dysphagia/aspiration precaution training;Cueing hierarchy;Functional tasks;Therapeutic Activities;Medication managment;Patient/family education;Therapeutic Exercise;Internal/external aids;Multimodal communication approach   Daily Session  Skilled Therapeutic Interventions: Pt seen for skilled SLP therapy focusing on cognitive, speech and dysphagia goals. Nurse tech reported some concern re: pt coughing with thin liquids. Pt assisted with tray and only 2 episodes of throat clearing noted with consistent cueing to take small sips. Pt benefits from slow rate of intake and double swallows with each sip. Min A to observe swallow precautions. Pt intelligibility at the word to sentence level was ~75% acc secondary to low vocal intensity. Pt able to recall all of the bills she typically pays in a month at mod I for increased time, but declined to participate with mathematic function activity secondary to fatigue.      Function:   Eating Eating   Modified Consistency Diet: Yes Eating Assist Level: Helper feeds patient;Supervision or verbal cues     Helper Scoops Food on Utensil: Every scoop Helper Brings Food to Mouth: Every scoop   Cognition Comprehension  Comprehension assist level: Understands basic 90% of the time/cues < 10% of the time  Expression   Expression assist level: Expresses basic 75 - 89% of the time/requires cueing 10 - 24% of the time. Needs helper to occlude trach/needs to repeat words.  Social  Interaction Social Interaction assist level: Interacts appropriately 90% of the time - Needs monitoring or encouragement for participation or interaction.  Problem Solving Problem solving assist level: Solves basic 50 - 74% of the time/requires cueing 25 - 49% of the time  Memory Memory assist level: Recognizes or recalls 75 - 89% of the time/requires cueing 10 - 24% of the time   General    Pain Pain Assessment Pain Assessment: No/denies pain Pain Score: 4  Pain Type: Acute pain Pain Location: Neck Pain Orientation: Posterior Pain Descriptors / Indicators: Aching Pain Onset: On-going Pain Intervention(s): Repositioned;Distraction  Therapy/Group: Individual Therapy  Vinetta Bergamo MA, CCC-SLP 12/28/2015, 3:51 PM

## 2015-12-28 NOTE — Progress Notes (Signed)
Subjective/Complaints: Had a fair night. Up with therapy already     Review of systems denies any breathing problems no chest pain no shortness of breath, positive nausea,, negative vomiting negative abdominal pain.  Objective: Vital Signs: Blood pressure 140/71, pulse 64, temperature 98.4 F (36.9 C), temperature source Oral, resp. rate 18, height 5\' 9"  (1.753 m), weight 61.916 kg (136 lb 8 oz), last menstrual period 03/31/2004, SpO2 97 %. No results found. Results for orders placed or performed during the hospital encounter of 12/21/15 (from the past 72 hour(s))  Urinalysis, Routine w reflex microscopic (not at [facility])     Status: Abnormal   Collection Time: 12/26/15  2:30 PM  Result Value Ref Range   Color, Urine RED (A) YELLOW    Comment: BIOCHEMICALS MAY BE AFFECTED BY COLOR   APPearance TURBID (A) CLEAR   Specific Gravity, Urine 1.023 1.005 - 1.030   pH 7.0 5.0 - 8.0   Glucose, UA NEGATIVE NEGATIVE mg/dL   Hgb urine dipstick LARGE (A) NEGATIVE   Bilirubin Urine NEGATIVE NEGATIVE   Ketones, ur 15 (A) NEGATIVE mg/dL   Protein, ur 100 (A) NEGATIVE mg/dL   Nitrite NEGATIVE NEGATIVE   Leukocytes, UA LARGE (A) NEGATIVE  Culture, Urine     Status: Abnormal (Preliminary result)   Collection Time: 12/26/15  2:30 PM  Result Value Ref Range   Specimen Description URINE, CATHETERIZED    Special Requests NONE    Culture >=100,000 COLONIES/mL ESCHERICHIA COLI (A)    Report Status PENDING   Urine microscopic-add on     Status: Abnormal   Collection Time: 12/26/15  2:30 PM  Result Value Ref Range   Squamous Epithelial / LPF NONE SEEN NONE SEEN   WBC, UA TOO NUMEROUS TO COUNT 0 - 5 WBC/hpf   RBC / HPF TOO NUMEROUS TO COUNT 0 - 5 RBC/hpf   Bacteria, UA MANY (A) NONE SEEN     General: No acute distress Mood is  flat Heart: Regular rate and rhythm no rubs murmurs or extra sounds Lungs: Clear to auscultation, breathing unlabored, no rales or wheezes Abdomen: Positive bowel sounds,  soft nontender to palpation, nondistended Extremities: No clubbing, cyanosis, or edema Skin: No evidence of breakdown, no evidence of rash Neurologic: Cranial nerves II through XII intact, motor strength is 2+ to 3-/5 in bilateral deltoid, bicep,trace  tricep,  2- grip, 1 to 2-/5 hip flexor, knee extensors, ankle dorsiflexor and plantar flexor, trace right toe flexion right moving more than left Sensory exam absent in bilateral lower extremities to light touch  Musculoskeletal: Full passive  range of motion in all 4 extremities. No joint swelling   Assessment/Plan: 1. 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 Physiatrist is providing close team supervision and 24 hour management of active medical problems listed below. Physiatrist and rehab team continue to assess barriers to discharge/monitor patient progress toward functional and medical goals. FIM: Function - Bathing Position: Other (comment) (Recliner) Body parts bathed by patient: Right arm, Left arm, Chest, Abdomen (Hand over hand assist) Body parts bathed by helper: Front perineal area, Buttocks Bathing not applicable: Right upper leg, Left upper leg, Right lower leg, Left lower leg, Back Assist Level: Touching or steadying assistance(Pt > 75%) (hand over hand assist using bath mit)  Function- Upper Body Dressing/Undressing What is the patient wearing?: Pull over shirt/dress Pull over shirt/dress - Perfomed by patient: Thread/unthread right  sleeve, Thread/unthread left sleeve Pull over shirt/dress - Perfomed by helper: Thread/unthread right sleeve, Thread/unthread left sleeve, Put head through opening, Pull shirt over trunk Assist Level: 2 helpers Function - Lower Body Dressing/Undressing What is the patient wearing?: Pants, [employer], Socks Position: Bed Pants- Performed by helper: Thread/unthread  right pants leg, Thread/unthread left pants leg, Pull pants up/down Non-skid slipper socks- Performed by helper: Don/doff right sock, Don/doff left sock TED Hose - Performed by helper: Don/doff right TED hose, Don/doff left TED hose Assist for footwear: Dependant Assist for lower body dressing: 2 Helpers  Function - Toileting Toileting activity did not occur: No continent bowel/bladder event Toileting steps completed by patient: Adjust clothing prior to toileting, Performs perineal hygiene, Adjust clothing after toileting (per Berkley Harvey, Nt report) Assist level: Two helpers (Per [place], NT report)  Function - Air cabin crew transfer activity did not occur: Safety/medical concerns  Function - Chair/bed transfer Chair/bed transfer method: Lateral scoot Chair/bed transfer assist level: Total assist (Pt < 25%) Chair/bed transfer assistive device: Sliding board, Armrests Chair/bed transfer details: Manual facilitation for placement, Manual facilitation for weight shifting, Manual facilitation for weight bearing, Verbal cues for safe use of DME/AE, Verbal cues for precautions/safety, Verbal cues for technique  Function - Locomotion: Wheelchair Will patient use wheelchair at discharge?: Yes Type: Manual Wheelchair activity did not occur: Safety/medical concerns Assist Level: Dependent (Pt equals 0%) Wheel 50 feet with 2 turns activity did not occur: Safety/medical concerns Assist Level: Dependent (Pt equals 0%) Wheel 150 feet activity did not occur: Safety/medical concerns Assist Level: Dependent (Pt equals 0%) Function - Locomotion: Ambulation Ambulation activity did not occur: Safety/medical concerns Walk 10 feet activity did not occur: Safety/medical concerns Walk 50 feet with 2 turns activity did not occur: Safety/medical concerns Walk 150 feet activity did not occur: Safety/medical concerns Walk 10 feet on uneven surfaces activity did not occur: Safety/medical  concerns  Function - Comprehension Comprehension: Auditory Comprehension assist level: Understands basic 90% of the time/cues < 10% of the time  Function - Expression Expression: Verbal Expression assist level: Expresses basic 75 - 89% of the time/requires cueing 10 - 24% of the time. Needs helper to occlude trach/needs to repeat words.  Function - Social Interaction Social Interaction assist level: Interacts appropriately 90% of the time - Needs monitoring or encouragement for participation or interaction.  Function - Problem Solving Problem solving assist level: Solves basic 50 - 74% of the time/requires cueing 25 - 49% of the time  Function - Memory Memory assist level: Recognizes or recalls 75 - 89% of the time/requires cueing 10 - 24% of the time Patient normally able to recall (first 3 days only): Current season, Location of own room, Staff names and faces, That he or she is in a hospital  Medical Problem List and Plan: 1.  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 Cervical collar at all times   - continue therapies. Patient needs regular motivation  2.  DVT Prophylaxis/Anticoagulation: dopplers negative  -changed to sq lovenox 3. Pain Management/fibromyalgia/chronic back pain: Chronic prednisone 10 mg twice a day,  Oxycodone and Zanaflex as needed,    - Lyrica 50mg  now scheduled TID for nerve pain  -scheduled robaxin for neck/shoulder girdle pain  4. Mood: Xanax 0.5 mg twice a day 5. Neuropsych: This patient is capable of making decisions on her own behalf. 6. Skin/Wound Care: Routine skin checks 7. Fluids/Electrolytes/Nutrition: still eating approx 50-100% 8.  Hypertension. Hydrochlorothiazide 25 mg daily, lisinopril 20 mg daily. Fair control 9. Constipation. Laxative assistance 10. Hyperlipidemia.Lovaza 11. Neurogenic bladder continue ICP program. Has no  sense of bladder filling  -cath volumes 300-500 12. Hypoalbuminemia--added pro-stat  LOS (Days) 7 A FACE TO FACE EVALUATION WAS PERFORMED  SWARTZ,ZACHARY T 12/28/2015, 8:29 AM

## 2015-12-28 NOTE — Plan of Care (Signed)
Problem: SCI BLADDER ELIMINATION Goal: RH STG MANAGE BLADDER WITH ASSISTANCE STG Manage Bladder With max Assistance  Outcome: Not Progressing Total A, requiring I&O cath

## 2015-12-28 NOTE — Plan of Care (Signed)
Problem: SCI BLADDER ELIMINATION Goal: RH STG MANAGE BLADDER WITH EQUIPMENT WITH ASSISTANCE STG Manage Bladder With Equipment With max Assistance  Outcome: Not Progressing Total A

## 2015-12-28 NOTE — Plan of Care (Signed)
Problem: RH PAIN MANAGEMENT Goal: RH STG PAIN MANAGED AT OR BELOW PT'S PAIN GOAL < 4  Outcome: Not Progressing Reports pain >4

## 2015-12-28 NOTE — Progress Notes (Signed)
Physical Therapy Weekly Progress Note  Patient Details  Name: [name] MRN: [id number] Date of Birth: 06/04/1946  Beginning of progress report period: Dec 22, 2015 End of progress report period: December 28, 2015  Today's Date: 12/28/2015   Patient has met 0 of 5 short term goals.  Pt is making very limited progress due to fatigue, pain, and poor motor return. Pt unable to tolerate 4 hour schedule per day and schedule now modified to 3 hours per day. Pt currently requiring up to max/ total +2 for mobility. Likely will require SNF upon d/c unless significant progress is made in the next couple weeks as family is unable to provide this level of care.   Patient continues to demonstrate the following deficits:  Quadriparesis, decreased endurance, pain, decreased activity tolerance, decreased strength, decreased functional mobility, abnormal tone, and therefore will continue to benefit from skilled PT intervention to enhance overall performance with activity tolerance, balance, postural control, ability to compensate for deficits, functional use of  right upper extremity, right lower extremity, left upper extremity and left lower extremity, awareness, coordination and knowledge of precautions.  Patient not progressing toward long term goals.  See goal revision..  Plan of care revisions: goals downgraded to mod/max w/c level due to lack of progress.. If patient improves, goals will be upgraded.   PT Short Term Goals Week 1:  PT Short Term Goal 1 (Week 1): Pt will increase rolling with side rails to mod A.  PT Short Term Goal 1 - Progress (Week 1): Not met PT Short Term Goal 2 (Week 1): Pt willl increase transfers supine to edge of bed, edge of bed to supine to max A.  PT Short Term Goal 2 - Progress (Week 1): Not met PT Short Term Goal 3 (Week 1): Pt will increase transfers bed to chair to max A.  PT Short Term Goal 3 - Progress (Week 1): Not met PT Short Term Goal 4 (Week 1): Pt will  increase ambulation with appropriate assistive device to max A about 10 feet.  PT Short Term Goal 4 - Progress (Week 1): Not met PT Short Term Goal 5 (Week 1): Pt will propel w/c about 50 feet with min A.  PT Short Term Goal 5 - Progress (Week 1): Not met Week 2:  PT Short Term Goal 1 (Week 2): Pt will be able to tolerate OOB x 2 hours at a time in w/c PT Short Term Goal 2 (Week 2): Pt will be able to demonstrate sitting balance with max assist during functional task EOM PT Short Term Goal 3 (Week 2): Pt will be able to perform transfers with max assist  Skilled Therapeutic Interventions/Progress Updates:  Balance/vestibular training;Community reintegration;DME/adaptive equipment instruction;Functional electrical stimulation;Functional mobility training;Patient/family education;Neuromuscular re-education;Pain management;Psychosocial support;UE/LE Strength taining/ROM;Wheelchair propulsion/positioning;Therapeutic Activities;UE/LE Coordination activities;Visual/perceptual remediation/compensation;Therapeutic Exercise;Splinting/orthotics;Cognitive remediation/compensation;Discharge planning;Disease management/prevention;Ambulation/gait training;Skin care/wound management   Therapy Documentation Precautions:  Precautions Precautions: Fall, Cervical Required Braces or Orthoses: Cervical Brace Cervical Brace: Hard collar, At all times Restrictions Weight Bearing Restrictions: No    See Function Navigator for Current Functional Status.   Canary Brim Ivory Broad, PT, DPT  12/28/2015, 3:30 PM

## 2015-12-28 NOTE — Progress Notes (Signed)
Occupational Therapy Note  Patient Details  Name: [name] MRN: [id number] Date of Birth: Aug 15, 1945  Today's Date: 12/28/2015 OT Co-Treatment Time: 1030-1100 OT Co-Treatment Time Calculation (min): 30 min  Pt c/o neck and shoulder pain (unrated) - premedicated Co-treatment with PT (Total therapy time 1000-1100)   Co-treatment with PT with focus on functional slideboard transfers, changing w/c and adjusting w/c for appropriate fit and improved postural alignment, overall endurance, and neuro re-ed to address balance/trunk control/limits of stability. BUE/shoulder PROM performed while seated in w/c and supported sitting on mat. Pt very fatigued and required encouragement throughout session. Recommending to decrease therapy schedule and will discuss with primary team and MD. Pt with c/o lightheadedness and nausea during session (BP values in Doc Flow) but they are actually quite high in both supine and seated positions. RN made aware. Pt required total A for all mobility and verbal and tactile cues for weighshifting, sequencing, and initiation during mobility.  Leotis Shames [facility] 12/28/2015, 12:14 PM

## 2015-12-28 NOTE — Plan of Care (Signed)
   Pt's plan of care adjusted to 3 hours per day instead of 4 hours per day after speaking with care team and discussed with MD in team conference as pt currently unable to tolerate current therapy schedule with OT, PT, and SLP.   Lars Masson, PT, DPT

## 2015-12-29 ENCOUNTER — Inpatient Hospital Stay (HOSPITAL_COMMUNITY): Payer: Medicare Other | Admitting: *Deleted

## 2015-12-29 DIAGNOSIS — B9620 Unspecified Escherichia coli [E. coli] as the cause of diseases classified elsewhere: Secondary | ICD-10-CM

## 2015-12-29 DIAGNOSIS — N390 Urinary tract infection, site not specified: Secondary | ICD-10-CM

## 2015-12-29 MED ORDER — NITROFURANTOIN MONOHYD MACRO 100 MG PO CAPS
100.0000 mg | ORAL_CAPSULE | Freq: Two times a day (BID) | ORAL | Status: AC
  Administered 2015-12-29 – 2016-01-01 (×7): 100 mg via ORAL
  Filled 2015-12-29 (×7): qty 1

## 2015-12-29 NOTE — Plan of Care (Signed)
Problem: SCI BOWEL ELIMINATION Goal: RH STG MANAGE BOWEL WITH ASSISTANCE STG Manage Bowel with mod Assistance.  Outcome: Not Progressing Requires senna daily- poor appetite  Problem: SCI BLADDER ELIMINATION Goal: RH STG MANAGE BLADDER WITH ASSISTANCE STG Manage Bladder With max Assistance  Outcome: Not Progressing Requires I/O cath q8 hours- no void  Problem: RH PAIN MANAGEMENT Goal: RH STG PAIN MANAGED AT OR BELOW PT'S PAIN GOAL < 4  Outcome: Not Progressing Neck/shoulder pain down to 8 out of 10

## 2015-12-29 NOTE — Progress Notes (Signed)
Physical Therapy Session Note  Patient Details  Name: [name] MRN: [id number] Date of Birth: 1946-06-15  Today's Date: 12/29/2015 PT Individual Time: 1345-1430 PT Individual Time Calculation (min): 45 min    Skilled Therapeutic Interventions/Progress Updates:  Patient in bed, agrees to session after increased encouragement, totally dependant for donning TED hose and lower body dressing. Rolling in bed with max A, coming to sit EOB with max A and max A for maintaining sitting balance. Transfer to w/c with sliding board max A of 2. Patient transported to therapy gym , attempted to initiate standing in frame, after set up and achieving position patient not able to tolerate standing more than 10 seconds , complains of pain, dizziness. Seated BP 131/56, standing 110/55. Patient returned to sitting, attempted sit to stand with max A of 2 from w/c , manual assistance and locking B LE- unsuccessful due to complains of pain and discomfort from neck brace.  Patient returned to room, RN notified of pain and med request. Patient sitting in TIS with all needs within reach and 3 x trial for use of call button to assure she can call for assistance as needed  Therapy Documentation Precautions:  Precautions Precautions: Fall, Cervical Required Braces or Orthoses: Cervical Brace Cervical Brace: Hard collar, At all times Restrictions Weight Bearing Restrictions: No Pain: Pain Assessment Pain Assessment: 0-10 Pain Score: 10-Worst pain ever Pain Type: Acute pain Pain Location: Neck Pain Descriptors / Indicators: Aching Pain Onset: Gradual Pain Intervention(s): Medication (See eMAR)   See Function Navigator for Current Functional Status.   Therapy/Group: Individual Therapy  [name] 12/29/2015, 3:52 PM

## 2015-12-29 NOTE — Progress Notes (Signed)
Subjective/Complaints: Patient sitting up in bed this morning. She states she had trouble sleeping because she felt that her c-collar was choking her.     Review of systems denies any breathing problems no chest pain no shortness of breath, negative for nausea, vomiting, diarrhea  Objective: Vital Signs: Blood pressure 122/67, pulse 84, temperature 98.6 F (37 C), temperature source Oral, resp. rate 17, height 5\' 9"  (1.753 m), weight 61.689 kg (136 lb), last menstrual period 03/31/2004, SpO2 96 %. No results found. Results for orders placed or performed during the hospital encounter of 12/21/15 (from the past 72 hour(s))  Urinalysis, Routine w reflex microscopic (not at [facility])     Status: Abnormal   Collection Time: 12/26/15  2:30 PM  Result Value Ref Range   Color, Urine RED (A) YELLOW    Comment: BIOCHEMICALS MAY BE AFFECTED BY COLOR   APPearance TURBID (A) CLEAR   Specific Gravity, Urine 1.023 1.005 - 1.030   pH 7.0 5.0 - 8.0   Glucose, UA NEGATIVE NEGATIVE mg/dL   Hgb urine dipstick LARGE (A) NEGATIVE   Bilirubin Urine NEGATIVE NEGATIVE   Ketones, ur 15 (A) NEGATIVE mg/dL   Protein, ur 100 (A) NEGATIVE mg/dL   Nitrite NEGATIVE NEGATIVE   Leukocytes, UA LARGE (A) NEGATIVE  Culture, Urine     Status: Abnormal   Collection Time: 12/26/15  2:30 PM  Result Value Ref Range   Specimen Description URINE, CATHETERIZED    Special Requests NONE    Culture >=100,000 COLONIES/mL ESCHERICHIA COLI (A)    Report Status 12/28/2015 FINAL    Organism ID, Bacteria ESCHERICHIA COLI (A)       Susceptibility   Escherichia coli - MIC*    AMPICILLIN 4 SENSITIVE Sensitive     CEFAZOLIN <=4 SENSITIVE Sensitive     CEFTRIAXONE <=1 SENSITIVE Sensitive     CIPROFLOXACIN <=0.25 SENSITIVE Sensitive     GENTAMICIN <=1 SENSITIVE Sensitive     IMIPENEM <=0.25 SENSITIVE Sensitive     NITROFURANTOIN <=16 SENSITIVE Sensitive     TRIMETH/SULFA >=320 RESISTANT Resistant     AMPICILLIN/SULBACTAM <=2  SENSITIVE Sensitive     PIP/TAZO <=4 SENSITIVE Sensitive     * >=100,000 COLONIES/mL ESCHERICHIA COLI  Urine microscopic-add on     Status: Abnormal   Collection Time: 12/26/15  2:30 PM  Result Value Ref Range   Squamous Epithelial / LPF NONE SEEN NONE SEEN   WBC, UA TOO NUMEROUS TO COUNT 0 - 5 WBC/hpf   RBC / HPF TOO NUMEROUS TO COUNT 0 - 5 RBC/hpf   Bacteria, UA MANY (A) NONE SEEN     General: No acute distress. Vital signs reviewed. Psych: Mood is  flat. Behavior is appropriate Heart: Regular rate and rhythm no rubs murmurs or extra sounds Lungs: Clear to auscultation, breathing unlabored, no rales or wheezes Abdomen: Positive bowel sounds, soft nontender to palpation, nondistended Skin: No evidence of breakdown, no evidence of rash Neurologic:  Motor strength is  Right upper extremity: 1+/5 proximal to distal Left upper extremity: 1/5 proximal to distal Right lower extremity: 2+/5 proximal distal Left lower extremity: 1/5 proximal to distal Sensory exam absent in bilateral lower extremities to light touch Musculoskeletal: No edema. No tenderness.  Assessment/Plan: 1. 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 Physiatrist is providing close team supervision and 24 hour management of active medical problems listed below. Physiatrist and rehab team  continue to assess barriers to discharge/monitor patient progress toward functional and medical goals. FIM: Function - Bathing Bathing activity did not occur:  (Night Bath) Position: Other (comment) (Recliner) Body parts bathed by patient: Right arm, Left arm, Chest, Abdomen (Hand over hand assist) Body parts bathed by helper: Front perineal area, Buttocks Bathing not applicable: Right upper leg, Left upper leg, Right lower leg, Left lower leg, Back Assist Level: Touching or steadying assistance(Pt >  75%) (hand over hand assist using bath mit)  Function- Upper Body Dressing/Undressing What is the patient wearing?: Pull over shirt/dress Pull over shirt/dress - Perfomed by patient: Thread/unthread right sleeve, Thread/unthread left sleeve Pull over shirt/dress - Perfomed by helper: Put head through opening, Pull shirt over trunk, Thread/unthread left sleeve, Thread/unthread right sleeve Assist Level: 2 helpers Function - Lower Body Dressing/Undressing What is the patient wearing?: Pants, Non-skid slipper socks, Ted Hose Position: Bed Pants- Performed by helper: Thread/unthread right pants leg, Thread/unthread left pants leg, Pull pants up/down Non-skid slipper socks- Performed by helper: Don/doff right sock, Don/doff left sock TED Hose - Performed by helper: Don/doff right TED hose, Don/doff left TED hose Assist for footwear: Dependant Assist for lower body dressing: 2 Helpers  Function - Toileting Toileting activity did not occur: No continent bowel/bladder event Toileting steps completed by patient: Adjust clothing prior to toileting, Performs perineal hygiene, Adjust clothing after toileting (per Berkley Harvey, Nt report) Assist level: Two helpers (Per [place], NT report)  Function - Air cabin crew transfer activity did not occur: Safety/medical concerns  Function - Chair/bed transfer Chair/bed transfer method: Lateral scoot Chair/bed transfer assist level: 2 helpers Chair/bed transfer assistive device: Sliding board, Armrests Chair/bed transfer details: Manual facilitation for placement, Manual facilitation for weight shifting, Manual facilitation for weight bearing, Verbal cues for safe use of DME/AE, Verbal cues for precautions/safety, Verbal cues for technique  Function - Locomotion: Wheelchair Will patient use wheelchair at discharge?: Yes Type: Manual Wheelchair activity did not occur: Safety/medical concerns Assist Level: Dependent (Pt equals 0%) Wheel 50 feet  with 2 turns activity did not occur: Safety/medical concerns Assist Level: Dependent (Pt equals 0%) Wheel 150 feet activity did not occur: Safety/medical concerns Assist Level: Dependent (Pt equals 0%) Function - Locomotion: Ambulation Ambulation activity did not occur: Safety/medical concerns Walk 10 feet activity did not occur: Safety/medical concerns Walk 50 feet with 2 turns activity did not occur: Safety/medical concerns Walk 150 feet activity did not occur: Safety/medical concerns Walk 10 feet on uneven surfaces activity did not occur: Safety/medical concerns  Function - Comprehension Comprehension: Auditory Comprehension assist level: Understands basic 90% of the time/cues < 10% of the time  Function - Expression Expression: Verbal Expression assist level: Expresses basic 75 - 89% of the time/requires cueing 10 - 24% of the time. Needs helper to occlude trach/needs to repeat words.  Function - Social Interaction Social Interaction assist level: Interacts appropriately 90% of the time - Needs monitoring or encouragement for participation or interaction.  Function - Problem Solving Problem solving assist level: Solves basic 50 - 74% of the time/requires cueing 25 - 49% of the time  Function - Memory Memory assist level: Recognizes or recalls 75 - 89% of the time/requires cueing 10 - 24% of the time Patient normally able to recall (first 3 days only): Current season, Location of own room, Staff names and faces, That he or she is in a hospital  Medical Problem List and Plan: 1.  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 Cervical collar at all times   - Continue CIR 2.  DVT Prophylaxis/Anticoagulation: dopplers negative  -changed to sq lovenox  -Vascular ultrasound negative for lower extremity DVT on 5/31 3. Pain Management/fibromyalgia/chronic back pain:  Chronic prednisone 10 mg twice a day,  Oxycodone and Zanaflex as needed,    -Lyrica 50mg  scheduled TID for nerve pain  -scheduled robaxin for neck/shoulder girdle pain  4. Mood: Xanax 0.5 mg twice a day 5. Neuropsych: This patient is capable of making decisions on her own behalf. 6. Skin/Wound Care: Routine skin checks 7. Fluids/Electrolytes/Nutrition: still eating approx 50-100% 8. Hypertension. Hydrochlorothiazide 25 mg daily, lisinopril 20 mg daily. Fair control 9. Constipation. Laxative assistance 10. Hyperlipidemia.Lovaza 11. Neurogenic bladder  continue ICP program  Has no sense of bladder filling  -cath volumes 400-600 12. Hypoalbuminemia--added pro-stat 13. Escherichia coli UTI  Macrobid 7 days started on 6/3  LOS (Days) 8 A FACE TO FACE EVALUATION WAS PERFORMED  Elijio Staples Lorie Phenix 12/29/2015, 7:17 AM

## 2015-12-30 ENCOUNTER — Inpatient Hospital Stay (HOSPITAL_COMMUNITY): Payer: Medicare Other | Admitting: Physical Therapy

## 2015-12-30 ENCOUNTER — Inpatient Hospital Stay (HOSPITAL_COMMUNITY): Payer: Medicare Other | Admitting: Occupational Therapy

## 2015-12-30 NOTE — Progress Notes (Signed)
Physical Therapy Session Note  Patient Details  Name: [name] MRN: [id number] Date of Birth: January 04, 1946  Today's Date: 12/30/2015 PT Individual Time: [id number] PT Individual Time Calculation (min): 33 min   Short Term Goals: Week 2:  PT Short Term Goal 1 (Week 2): Pt will be able to tolerate OOB x 2 hours at a time in w/c PT Short Term Goal 2 (Week 2): Pt will be able to demonstrate sitting balance with max assist during functional task EOM PT Short Term Goal 3 (Week 2): Pt will be able to perform transfers with max assist  Skilled Therapeutic Interventions/Progress Updates:    Pt received in bed & agreeable to PT, noting significant pain in head & neck & notes RN already aware. Pt reported having a BM & PT & NA assisted pt with cleaning. Pt required max A + 1 for rolling & maintaining sidelying position to allow another person to provide Total A for peri-hygiene. Pt unable to maintain hold on bed rail to assist with maintaining position. Pt performed BLE straight leg raises, hip abduction/adduction and short arc quads all 10x each AAROM. Pt demonstrates very minimal muscle activation with LLE weaker than RLE but pt able to complete RLE short arc quad independently. At end of session pt reported having another BM & PT & RN assisted pt with rolling & peri-hygiene in same manner as noted above. At end of session pt left in bed with pancake call bell by RUE & all needs within reach.   Therapy Documentation Precautions:  Precautions Precautions: Fall, Cervical Required Braces or Orthoses: Cervical Brace Cervical Brace: Hard collar, At all times Restrictions Weight Bearing Restrictions: No   See Function Navigator for Current Functional Status.   Therapy/Group: Individual Therapy  Waunita Schooner 12/30/2015, 12:39 PM

## 2015-12-30 NOTE — Progress Notes (Signed)
Subjective/Complaints: Patient seen lying in bed this morning. She notes that she had some pain with therapies yesterday, but slept better overnight. She states her collar feels more comfortable as well.  Review of systems denies any breathing problems no chest pain no shortness of breath, negative for nausea, vomiting, diarrhea  Objective: Vital Signs: Blood pressure 129/64, pulse 72, temperature 97.7 F (36.5 C), temperature source Oral, resp. rate 18, height 5\' 9"  (1.753 m), weight 61.689 kg (136 lb), last menstrual period 03/31/2004, SpO2 98 %. No results found. No results found for this or any previous visit (from the past 72 hour(s)).   General: No acute distress. Vital signs reviewed. Psych: Mood is  flat. Behavior is appropriate Heart: Regular rate and rhythm no rubs murmurs or extra sounds Lungs: Clear to auscultation, breathing unlabored, no rales or wheezes Abdomen: Positive bowel sounds, soft nontender to palpation, nondistended Skin: No evidence of breakdown, no evidence of rash Neurologic:  Motor strength is  Right upper extremity: 1+/5 proximal to distal Left upper extremity: 1/5 proximal to distal Right lower extremity: 2+/5 proximal distal Left lower extremity: 1/5 proximal to distal Sensory exam absent in bilateral lower extremities to light touch Musculoskeletal: No edema. No tenderness.  Assessment/Plan: 1. 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 Physiatrist is providing close team supervision and 24 hour management of active medical problems listed below. Physiatrist and rehab team continue to assess barriers to discharge/monitor patient progress toward functional and medical goals. FIM: Function - Bathing Bathing activity did not occur:  (Night Bath) Position: Other (comment) (Recliner) Body parts bathed by patient: Right  arm, Left arm, Chest, Abdomen (Hand over hand assist) Body parts bathed by helper: Front perineal area, Buttocks Bathing not applicable: Right upper leg, Left upper leg, Right lower leg, Left lower leg, Back Assist Level: Touching or steadying assistance(Pt > 75%) (hand over hand assist using bath mit)  Function- Upper Body Dressing/Undressing What is the patient wearing?: Pull over shirt/dress Pull over shirt/dress - Perfomed by patient: Thread/unthread right sleeve, Thread/unthread left sleeve Pull over shirt/dress - Perfomed by helper: Put head through opening, Pull shirt over trunk, Thread/unthread left sleeve, Thread/unthread right sleeve Assist Level: 2 helpers Function - Lower Body Dressing/Undressing What is the patient wearing?: Pants, Non-skid slipper socks, Ted Hose Position: Bed Pants- Performed by helper: Thread/unthread right pants leg, Thread/unthread left pants leg, Pull pants up/down Non-skid slipper socks- Performed by helper: Don/doff right sock, Don/doff left sock TED Hose - Performed by helper: Don/doff right TED hose, Don/doff left TED hose Assist for footwear: Dependant Assist for lower body dressing: 2 Helpers  Function - Toileting Toileting activity did not occur: No continent bowel/bladder event Toileting steps completed by patient: Adjust clothing prior to toileting, Performs perineal hygiene, Adjust clothing after toileting (per Berkley Harvey, Nt report) Assist level: Two helpers (Per [place], NT report)  Function - Air cabin crew transfer activity did not occur: Safety/medical concerns  Function - Chair/bed transfer Chair/bed transfer method: Lateral scoot Chair/bed transfer assist level: 2 helpers Chair/bed transfer assistive device: Sliding board Chair/bed transfer details: Manual facilitation for placement, Manual facilitation for weight shifting, Manual facilitation for weight bearing, Verbal cues for safe use of DME/AE, Verbal cues for  precautions/safety, Verbal cues for technique  Function - Locomotion: Wheelchair Will patient use wheelchair at discharge?: Yes Type: Manual Wheelchair activity did not occur: Safety/medical concerns Assist Level: Dependent (Pt equals  0%) Wheel 50 feet with 2 turns activity did not occur: Safety/medical concerns Assist Level: Dependent (Pt equals 0%) Wheel 150 feet activity did not occur: Safety/medical concerns Assist Level: Dependent (Pt equals 0%) Function - Locomotion: Ambulation Ambulation activity did not occur: Safety/medical concerns Walk 10 feet activity did not occur: Safety/medical concerns Walk 50 feet with 2 turns activity did not occur: Safety/medical concerns Walk 150 feet activity did not occur: Safety/medical concerns Walk 10 feet on uneven surfaces activity did not occur: Safety/medical concerns  Function - Comprehension Comprehension: Auditory Comprehension assist level: Understands basic 90% of the time/cues < 10% of the time  Function - Expression Expression: Verbal Expression assist level: Expresses basic 75 - 89% of the time/requires cueing 10 - 24% of the time. Needs helper to occlude trach/needs to repeat words.  Function - Social Interaction Social Interaction assist level: Interacts appropriately 90% of the time - Needs monitoring or encouragement for participation or interaction.  Function - Problem Solving Problem solving assist level: Solves basic 50 - 74% of the time/requires cueing 25 - 49% of the time  Function - Memory Memory assist level: Recognizes or recalls 75 - 89% of the time/requires cueing 10 - 24% of the time Patient normally able to recall (first 3 days only): Current season, Location of own room, Staff names and faces, That he or she is in a hospital  Medical Problem List and Plan: 1.  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 Cervical collar at all times   - Continue CIR 2.  DVT Prophylaxis/Anticoagulation: dopplers negative  -changed to sq lovenox  -Vascular ultrasound negative for lower extremity DVT on 5/31 3. Pain Management/fibromyalgia/chronic back pain: Chronic prednisone 10 mg twice a day,  Oxycodone and Zanaflex as needed,    -Lyrica 50mg  scheduled TID for nerve pain  -scheduled robaxin for neck/shoulder girdle pain  4. Mood: Xanax 0.5 mg twice a day 5. Neuropsych: This patient is capable of making decisions on her own behalf. 6. Skin/Wound Care: Routine skin checks 7. Fluids/Electrolytes/Nutrition: still eating approx 50-100% 8. Hypertension. Hydrochlorothiazide 25 mg daily, lisinopril 20 mg daily. Fair control 9. Constipation. Laxative assistance 10. Hyperlipidemia.Lovaza 11. Neurogenic bladder  continue ICP program  Has no sense of bladder filling  -cath volumes 300-600 12. Hypoalbuminemia--added pro-stat 13. Escherichia coli UTI  Macrobid 7 days started on 6/3  LOS (Days) 9 A FACE TO FACE EVALUATION WAS PERFORMED  Ankit Lorie Phenix 12/30/2015, 6:58 AM

## 2015-12-30 NOTE — Plan of Care (Signed)
Problem: SCI BOWEL ELIMINATION Goal: RH STG MANAGE BOWEL WITH ASSISTANCE STG Manage Bowel with mod Assistance.  Outcome: Not Progressing Required disimpaction of stool this a.m -on scheduled senna

## 2015-12-30 NOTE — Plan of Care (Signed)
Problem: RH Toileting Goal: LTG Patient will perform toileting w/assist, cues/equip (OT) LTG: Patient will perform toiletiing (clothes management/hygiene) with assist, with/without cues using equipment (OT)  Outcome: Not Applicable Date Met:  99/23/41 Goal d/c at this time as pt without continent bowel/ bladder. In/out cathing occuring and working on establishment of bowel program- AL  Problem: RH Toilet Transfers Goal: LTG Patient will perform toilet transfers w/assist (OT) LTG: Patient will perform toilet transfers with assist, with/without cues using equipment (OT)  Outcome: Not Applicable Date Met:  44/36/01 Goal d/c at this time as pt without continent bowel/ bladder. In/out cathing occuring and working on establishment of bowel program- AL

## 2015-12-30 NOTE — Progress Notes (Signed)
Occupational Therapy Weekly Progress Note  Patient Details  Name: [name] MRN: [id number] Date of Birth: 01-17-1946  Beginning of progress report period: Dec 22, 2015 End of progress report period: December 30, 2015  Today's Date: 12/30/2015 OT Individual Time: 0277-4128 OT Individual Time Calculation (min): 45 min    Patient has met 0 of 4 short term goals.  Pt making slow progress towards OT goals. She is very limited by fatigue and pain in back/neck. Pt will likely require additional therapy services in SNF unless great improvement is made in the next couple of weeks as pt's caregiver's unable to provide the amount of physical assistance pt currently requires. Pt currently requiring max- total A for all ADLs and functional mobility.   Patient continues to demonstrate the following deficits: acute pain and quadriparesis at level C4 - C6 and therefore will continue to benefit from skilled OT intervention to enhance overall performance with BADL and Reduce care partner burden.  Patient not progressing toward long term goals.  See goal revision..  Plan of care revisions: Goals modified to overall mod-max A level. See POC for goal revisions. .  OT Short Term Goals Week 1:  OT Short Term Goal 1 (Week 1): Pt will be able to maintain sitting balance EOB with min A. OT Short Term Goal 1 - Progress (Week 1): Not met OT Short Term Goal 2 (Week 1): Pt will be able to don a shirt with mod A. OT Short Term Goal 2 - Progress (Week 1): Not met OT Short Term Goal 3 (Week 1): Pt will have improved grasp strength to hold washcloth to wash UB with mod A. OT Short Term Goal 3 - Progress (Week 1): Not met OT Short Term Goal 4 (Week 1): Pt will be able to roll in bed with mod A to assist caregivers with LB dressing. OT Short Term Goal 4 - Progress (Week 1): Partly met Week 2:  OT Short Term Goal 1 (Week 2): Pt will maintain static sitting balance seated EOM with close supervision in prep for  functional task OT Short Term Goal 2 (Week 2): Pt will don shirt with mod A with set-up OT Short Term Goal 3 (Week 2): Pt will direct caregiver with bed mobility with 1 VCs in prep for functional rolling.  Skilled Therapeutic Interventions/Progress Updates:    Pt seen for OT session focusing on functional mobility, upright tolerance, and positioning. Pt in supine upon arrival, voicing increased pain in neck and back. She reported having been pre-medicated prior to tx session.  Pants and TED hose donned total A in supine. Today pt with less active movement in R LE compared to previous sessions, unable to bend knee independently in prep for rolling.  She transferred to EOB with max - total A and sliding board transfer completed with +2 total A. Grooming completed from w/c level at sink. Hand held assist provided for holding comb to brush hair, though pt becoming discouraged with task as she can not feel comb in her hand. She voiced slight shoulder discomfort with shoulder ABduction, no subluxation felt however. She voiced desire to play word game she played in speech therapy. Pt taken to therapy day room for change of scenery. Participated in "head Bandz" game, requiring pt to describe words for therapist to guess and vice versa. She stated she enjoys working her mind with such activities and appeared in much brighter spirits following tx session. Pt left reclined in w/c at end of session,  soft call bell in reach.   Therapy Documentation Precautions:  Precautions Precautions: Fall, Cervical Required Braces or Orthoses: Cervical Brace Cervical Brace: Hard collar, At all times Restrictions Weight Bearing Restrictions: No Pain: Pain Assessment Pain Assessment: 0-10 Pain Score: 9  Pain Type: Acute pain Pain Location: Neck Pain Descriptors / Indicators: Aching Pain Onset: On-going Pain Intervention(s): Repositioned  See Function Navigator for Current Functional Status.   Therapy/Group:  Individual Therapy  Lewis, Loyed Wilmes C 12/30/2015, 6:49 AM

## 2015-12-31 ENCOUNTER — Inpatient Hospital Stay (HOSPITAL_COMMUNITY): Payer: Medicare Other | Admitting: Speech Pathology

## 2015-12-31 ENCOUNTER — Inpatient Hospital Stay (HOSPITAL_COMMUNITY): Payer: Medicare Other | Admitting: Physical Therapy

## 2015-12-31 ENCOUNTER — Inpatient Hospital Stay (HOSPITAL_COMMUNITY): Payer: Medicare Other

## 2015-12-31 ENCOUNTER — Inpatient Hospital Stay (HOSPITAL_COMMUNITY): Payer: Medicare Other | Admitting: Occupational Therapy

## 2015-12-31 MED ORDER — PREGABALIN 75 MG PO CAPS
75.0000 mg | ORAL_CAPSULE | Freq: Three times a day (TID) | ORAL | Status: DC
  Administered 2015-12-31 – 2016-01-01 (×4): 75 mg via ORAL
  Filled 2015-12-31 (×4): qty 1

## 2015-12-31 MED ORDER — MEGESTROL ACETATE 400 MG/10ML PO SUSP
400.0000 mg | Freq: Two times a day (BID) | ORAL | Status: DC
  Administered 2015-12-31 – 2016-01-14 (×20): 400 mg via ORAL
  Filled 2015-12-31 (×27): qty 10

## 2015-12-31 NOTE — Plan of Care (Signed)
Problem: SCI BLADDER ELIMINATION Goal: RH STG MANAGE BLADDER WITH ASSISTANCE STG Manage Bladder With max Assistance  Outcome: Not Progressing Total A     

## 2015-12-31 NOTE — Progress Notes (Signed)
Physical Therapy Session Note  Patient Details  Name: [name] MRN: [id number] Date of Birth: 04/17/1946  Today's Date: 12/31/2015 PT Individual Time: 1015-1055 PT Individual Time Calculation (min): 40 min   Short Term Goals: Week 2:  PT Short Term Goal 1 (Week 2): Pt will be able to tolerate OOB x 2 hours at a time in w/c PT Short Term Goal 2 (Week 2): Pt will be able to demonstrate sitting balance with max assist during functional task EOM PT Short Term Goal 3 (Week 2): Pt will be able to perform transfers with max assist  Skilled Therapeutic Interventions/Progress Updates:   Pt up in w/c reclined back complaining of fatigue, not doing well, and cervical pain. Pt agreeable to therapy but declined suggestion by therapist for standing frame or sitting balance activity. Pt educated on purpose of therapeutic activities and how this related to goals - verbalized understanding. Seated in w/c performed neuro re-ed for BLE motor activation, coordination, and trunk control to kick beach ball (more endurance and ability on R than L) and total A for sitting balance when back not supported. End of session, due to needing to be in/out cathed by NT before next therapy, returned back to bed with total A (+2 for equipment and safety) and positioned in supine. Rolled with max assist to remove brief and pants for NT to perform cath. Missed 5 min due to this to allow patient to be more ready for next Speech Therapy session.   Therapy Documentation Precautions:  Precautions Precautions: Fall, Cervical Required Braces or Orthoses: Cervical Brace Cervical Brace: Hard collar, At all times Restrictions Weight Bearing Restrictions: No General: PT Amount of Missed Time (min): 5 Minutes PT Missed Treatment Reason: Nursing care (pt needing to be cathed)  Pain: Pt wanting to wait on any medication - reports she had some earlier.    See Function Navigator for Current Functional  Status.   Therapy/Group: Individual Therapy  Canary Brim Ivory Broad, PT, DPT  12/31/2015, 12:10 PM

## 2015-12-31 NOTE — Plan of Care (Signed)
Problem: SCI BLADDER ELIMINATION Goal: RH STG MANAGE BLADDER WITH ASSISTANCE STG Manage Bladder With max Assistance  Outcome: Not Progressing Patient is I&O cath

## 2015-12-31 NOTE — Progress Notes (Signed)
Subjective/Complaints: Lying in bed. Had questions about when her sensation will return. Pain is still an issue but trying to cope with it.  Review of systems denies any breathing problems no chest pain no shortness of breath, negative for nausea, vomiting, diarrhea  Objective: Vital Signs: Blood pressure 98/46, pulse 62, temperature 97.7 F (36.5 C), temperature source Oral, resp. rate 20, height 5\' 9"  (1.753 m), weight 61.689 kg (136 lb), last menstrual period 03/31/2004, SpO2 98 %. No results found. No results found for this or any previous visit (from the past 72 hour(s)).   General: No acute distress. Vital signs reviewed. Psych: Mood is  flat. Behavior is appropriate Heart: Regular rate and rhythm no rubs murmurs or extra sounds Lungs: Clear to auscultation, breathing unlabored, no rales or wheezes Abdomen: Positive bowel sounds, soft nontender to palpation, nondistended Skin: No evidence of breakdown, no evidence of rash Neurologic:  Motor strength is  Right upper extremity: 1+/5 proximal to distal Left upper extremity: 1/5 proximal to distal Right lower extremity: 2+/5 proximal distal Left lower extremity: 1/5 proximal to distal Sensory exam absent in bilateral lower extremities to light touch Musculoskeletal: No edema. No tenderness.  Assessment/Plan: 1. 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 Physiatrist is providing close team supervision and 24 hour management of active medical problems listed below. Physiatrist and rehab team continue to assess barriers to discharge/monitor patient progress toward functional and medical goals. FIM: Function - Bathing Bathing activity did not occur:  (Night Bath) Position: Other (comment) (Recliner) Body parts bathed by patient: Right arm, Left arm, Chest, Abdomen (Hand over hand assist) Body parts  bathed by helper: Front perineal area, Buttocks Bathing not applicable: Right upper leg, Left upper leg, Right lower leg, Left lower leg, Back Assist Level: Touching or steadying assistance(Pt > 75%) (hand over hand assist using bath mit)  Function- Upper Body Dressing/Undressing What is the patient wearing?: Pull over shirt/dress Pull over shirt/dress - Perfomed by patient: Thread/unthread right sleeve, Thread/unthread left sleeve Pull over shirt/dress - Perfomed by helper: Put head through opening, Pull shirt over trunk, Thread/unthread left sleeve, Thread/unthread right sleeve Assist Level: 2 helpers Function - Lower Body Dressing/Undressing What is the patient wearing?: Pants, Non-skid slipper socks, Ted Hose Position: Bed Pants- Performed by helper: Thread/unthread right pants leg, Thread/unthread left pants leg, Pull pants up/down Non-skid slipper socks- Performed by helper: Don/doff right sock, Don/doff left sock TED Hose - Performed by helper: Don/doff right TED hose, Don/doff left TED hose Assist for footwear: Dependant Assist for lower body dressing: 2 Helpers  Function - Toileting Toileting activity did not occur: No continent bowel/bladder event Toileting steps completed by patient: Adjust clothing prior to toileting, Performs perineal hygiene, Adjust clothing after toileting (per Berkley Harvey, Nt report) Assist level: Two helpers (Per [place], NT report)  Function - Air cabin crew transfer activity did not occur: Safety/medical concerns  Function - Chair/bed transfer Chair/bed transfer method: Lateral scoot Chair/bed transfer assist level: 2 helpers Chair/bed transfer assistive device: Sliding board Chair/bed transfer details: Manual facilitation for placement, Manual facilitation for weight shifting, Manual facilitation for weight bearing, Verbal cues for safe use of DME/AE, Verbal cues for precautions/safety, Verbal cues for technique  Function - Locomotion:  Wheelchair Will patient use wheelchair at discharge?: Yes Type: Manual Wheelchair activity did not occur: Safety/medical concerns Assist Level: Dependent (Pt equals 0%) Wheel 50 feet with 2 turns activity  did not occur: Safety/medical concerns Assist Level: Dependent (Pt equals 0%) Wheel 150 feet activity did not occur: Safety/medical concerns Assist Level: Dependent (Pt equals 0%) Function - Locomotion: Ambulation Ambulation activity did not occur: Safety/medical concerns Walk 10 feet activity did not occur: Safety/medical concerns Walk 50 feet with 2 turns activity did not occur: Safety/medical concerns Walk 150 feet activity did not occur: Safety/medical concerns Walk 10 feet on uneven surfaces activity did not occur: Safety/medical concerns  Function - Comprehension Comprehension: Auditory Comprehension assist level: Understands basic 90% of the time/cues < 10% of the time  Function - Expression Expression: Verbal Expression assist level: Expresses basic 75 - 89% of the time/requires cueing 10 - 24% of the time. Needs helper to occlude trach/needs to repeat words.  Function - Social Interaction Social Interaction assist level: Interacts appropriately 90% of the time - Needs monitoring or encouragement for participation or interaction.  Function - Problem Solving Problem solving assist level: Solves basic 50 - 74% of the time/requires cueing 25 - 49% of the time  Function - Memory Memory assist level: Recognizes or recalls 75 - 89% of the time/requires cueing 10 - 24% of the time Patient normally able to recall (first 3 days only): Current season, Location of own room, Staff names and faces, That he or she is in a hospital  Medical Problem List and Plan: 1.  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 Cervical collar at all times   - Continue CIR 2.  DVT  Prophylaxis/Anticoagulation: dopplers negative  -changed to sq lovenox  -Vascular ultrasound negative for lower extremity DVT on 5/31 3. Pain Management/fibromyalgia/chronic back pain: Chronic prednisone 10 mg twice a day,  Oxycodone and Zanaflex as needed,    -Lyrica 50mg  scheduled TID for nerve pain---will try increasing to 75mg   -scheduled robaxin for neck/shoulder girdle pain  4. Mood: Xanax 0.5 mg twice a day 5. Neuropsych: This patient is capable of making decisions on her own behalf. 6. Skin/Wound Care: Routine skin checks 7. Fluids/Electrolytes/Nutrition: intake suboptimal  -discussed megace with patient---she would like to try 8. Hypertension. Hydrochlorothiazide 25 mg daily, lisinopril 20 mg daily. Fair control 9. Constipation. Laxative assistance 10. Hyperlipidemia.Lovaza 11. Neurogenic bladder  continue ICP program  Has no sense of bladder filling  -cath volumes 300-600 12. Hypoalbuminemia--added pro-stat 13. Escherichia coli UTI  Macrobid 7 days started on 6/3  LOS (Days) 10 A FACE TO FACE EVALUATION WAS PERFORMED  SWARTZ,ZACHARY T 12/31/2015, 8:42 AM

## 2015-12-31 NOTE — Progress Notes (Signed)
Speech Language Pathology Daily Session Note  Patient Details  Name: [name] MRN: [id number] Date of Birth: 06/06/1946  Today's Date: 12/31/2015 SLP Individual Time: 1100-1200 SLP Individual Time Calculation (min): 60 min  Short Term Goals: Week 2 goals.  Skilled Therapeutic Interventions: Pt alert and pleasant. Positioned upright in bed. Pt reported "100 out of 10" pain, but was able to participate throughout session. Pt reported double vision when attempting to read. The pt reported that she has had double vision since her surgery, but hasn't made anyone aware because she thought it was a normal part of recovery. RN was made aware of report of double vision and report of ongoing pain. Pt trialed with graham cracker dipped in milk (functionally a puree) which she managed without s/s aspiration with self-cued sips of thin in between solid boluses. Pt was able to independently verbally describe how to write a check with 100% acc.    Function:  Eating Eating   Modified Consistency Diet: Yes Eating Assist Level: Helper feeds patient     Helper Scoops Food on Utensil: Every scoop Helper Brings Food to Mouth: Every scoop   Cognition Comprehension Comprehension assist level: Understands basic 90% of the time/cues < 10% of the time  Expression   Expression assist level: Expresses basic 90% of the time/requires cueing < 10% of the time.  Social Interaction Social Interaction assist level: Interacts appropriately 90% of the time - Needs monitoring or encouragement for participation or interaction.  Problem Solving Problem solving assist level: Solves basic 90% of the time/requires cueing < 10% of the time  Memory Memory assist level: Recognizes or recalls 75 - 89% of the time/requires cueing 10 - 24% of the time    Pain Pain Assessment Pain Assessment: 0-10 Pain Score: 10-Worst pain ever Pain Type: Chronic pain Pain Location: Neck Pain Onset: On-going Patients Stated  Pain Goal: 4 Pain Intervention(s): Distraction;Other (Comment) (pt had just received pain meds)  Therapy/Group: Individual Therapy  Vinetta Bergamo MA, CCC-SLP 12/31/2015, 12:14 PM

## 2015-12-31 NOTE — Progress Notes (Signed)
Occupational Therapy Session Note  Patient Details  Name: [name] MRN: [id number] Date of Birth: [birth year]  Today's Date: 12/31/2015 OT Individual Time: [id number] OT Individual Time Calculation (min): 60 min    Short Term Goals: Week 2:  OT Short Term Goal 1 (Week 2): Pt will maintain static sitting balance seated EOM with close supervision in prep for functional task OT Short Term Goal 2 (Week 2): Pt will don shirt with mod A with set-up OT Short Term Goal 3 (Week 2): Pt will direct caregiver with bed mobility with 1 VCs in prep for functional rolling.  Skilled Therapeutic Interventions/Progress Updates:    Pt seen for OT session focusing on ADL re-training and upright tolerance. Pt in supine upon arrival with RN present administering medications. Pt voiced increased pain in neck, however, wanting to get OOB. Pants donned total A rolling with max- total A to have pants pulled up. TED hose donned.  Pt able to slowly walk R LE of EOB in prep for transfer, however, required +2 to get to EOB. Upon sitting upright, pt voiced of increased neck pain and dizziness, BP 95/53. She returned to supine, RN made aware and permission granted to cont with therapy as pt able. In supine, pt's BP 91/54. Pt voiced dizziness had subsided and desiring to get to w/c. +2 total A transfer completed to w/c. Reclined in w/c BP 114/56.  Sitting in chair, required +2 assist for donning shirt due to decreased upright balance and assist for UEs.  Pt left reclined in chair at end of session, soft call bell in reach.   Therapy Documentation Precautions:  Precautions Precautions: Fall, Cervical Required Braces or Orthoses: Cervical Brace Cervical Brace: Hard collar, At all times Restrictions Weight Bearing Restrictions: No Pain: Pain Assessment Pain Assessment: 0-10 Pain Score: 10-Worst pain ever Pain Type: Chronic pain Pain Location: Neck Pain Onset: On-going Patients Stated Pain Goal: 4 Pain  Intervention(s): Distraction;Other (Comment) (pt had just received pain meds)  See Function Navigator for Current Functional Status.   Therapy/Group: Individual Therapy  Lewis, Chalyn Amescua C 12/31/2015, 7:10 AM

## 2015-12-31 NOTE — Progress Notes (Signed)
Physical Therapy Session Note  Patient Details  Name: [name] MRN: [id number] Date of Birth: 20-Apr-1946  Today's Date: 12/31/2015 PT Individual Time: 1415-1445 PT Individual Time Calculation (min): 30 min   Short Term Goals: Week 2:  PT Short Term Goal 1 (Week 2): Pt will be able to tolerate OOB x 2 hours at a time in w/c PT Short Term Goal 2 (Week 2): Pt will be able to demonstrate sitting balance with max assist during functional task EOM PT Short Term Goal 3 (Week 2): Pt will be able to perform transfers with max assist  Skilled Therapeutic Interventions/Progress Updates:  Pt received in bed reporting pain in bilat shoulders but premedicated; pt requesting to perform therapy bed level due to pain and did not wish to transfer and stay up in w/c.  Performed bilat LE AAROM and strengthening exercises: 8 reps each contract-relax gastroc stretches, hip flexion <> resisted extension, and hip ABD<>ADD with LE supported by therapist.  Also performed AAROM of bilat UE to reach face with rag and shoulder retraction stretches bilaterally with pt reporting improvement in shoulder pain with retraction stretches.  Pt continuing to report diplopia; performed quick vision screen-pt appears to be experiencing horizontal diplopia and able to converge slightly with targets 2-3 ft away.  Pt continued to report diplopia with monocular vision as well.  Will continue to assess.  Pt repositioned in bed and UE supported on pillow; pt left in bed with all items within reach.  Therapy Documentation Precautions:  Precautions Precautions: Fall, Cervical Required Braces or Orthoses: Cervical Brace Cervical Brace: Hard collar, At all times Restrictions Weight Bearing Restrictions: No Vital Signs: Therapy Vitals Temp: 97.6 F (36.4 C) Temp Source: Oral Pulse Rate: 60 Resp: 18 BP: (!) 100/59 mmHg Patient Position (if appropriate): Lying Oxygen Therapy SpO2: 100 % O2 Device: Not  Delivered Pain: Pain Assessment Pain Score: 6  Pain Type: Acute pain Pain Location: Shoulder Pain Orientation: Left;Right Pain Descriptors / Indicators: Aching;Sore Pain Frequency: Intermittent Pain Onset: Other (Comment) (with movement) Patients Stated Pain Goal: 4 Pain Intervention(s): Repositioned   See Function Navigator for Current Functional Status.   Therapy/Group: Individual Therapy  Raylene Everts [facility] 12/31/2015, 4:29 PM

## 2016-01-01 ENCOUNTER — Inpatient Hospital Stay (HOSPITAL_COMMUNITY): Payer: Medicare Other | Admitting: Speech Pathology

## 2016-01-01 ENCOUNTER — Inpatient Hospital Stay (HOSPITAL_COMMUNITY): Payer: Medicare Other

## 2016-01-01 ENCOUNTER — Inpatient Hospital Stay (HOSPITAL_COMMUNITY): Payer: Medicare Other | Admitting: Occupational Therapy

## 2016-01-01 ENCOUNTER — Inpatient Hospital Stay (HOSPITAL_COMMUNITY): Payer: Medicare Other | Admitting: Physical Therapy

## 2016-01-01 LAB — CREATININE, SERUM: Creatinine, Ser: 0.52 mg/dL (ref 0.44–1.00)

## 2016-01-01 LAB — CBC
HCT: 26.4 % — ABNORMAL LOW (ref 36.0–46.0)
HEMOGLOBIN: 8.6 g/dL — AB (ref 12.0–15.0)
MCH: 30.5 pg (ref 26.0–34.0)
MCHC: 32.6 g/dL (ref 30.0–36.0)
MCV: 93.6 fL (ref 78.0–100.0)
Platelets: 267 10*3/uL (ref 150–400)
RBC: 2.82 MIL/uL — AB (ref 3.87–5.11)
RDW: 13.1 % (ref 11.5–15.5)
WBC: 14.6 10*3/uL — ABNORMAL HIGH (ref 4.0–10.5)

## 2016-01-01 LAB — BASIC METABOLIC PANEL
ANION GAP: 7 (ref 5–15)
BUN: 20 mg/dL (ref 6–20)
CALCIUM: 8.9 mg/dL (ref 8.9–10.3)
CO2: 26 mmol/L (ref 22–32)
Chloride: 100 mmol/L — ABNORMAL LOW (ref 101–111)
Creatinine, Ser: 0.64 mg/dL (ref 0.44–1.00)
Glucose, Bld: 147 mg/dL — ABNORMAL HIGH (ref 65–99)
Potassium: 3.4 mmol/L — ABNORMAL LOW (ref 3.5–5.1)
SODIUM: 133 mmol/L — AB (ref 135–145)

## 2016-01-01 MED ORDER — POTASSIUM CHLORIDE CRYS ER 20 MEQ PO TBCR
20.0000 meq | EXTENDED_RELEASE_TABLET | Freq: Every day | ORAL | Status: DC
  Administered 2016-01-02 – 2016-01-15 (×14): 20 meq via ORAL
  Filled 2016-01-01 (×14): qty 1

## 2016-01-01 MED ORDER — OXYCODONE-ACETAMINOPHEN 5-325 MG PO TABS
1.0000 | ORAL_TABLET | ORAL | Status: DC | PRN
  Administered 2016-01-02 – 2016-01-11 (×19): 1 via ORAL
  Filled 2016-01-01 (×20): qty 1

## 2016-01-01 MED ORDER — CEPHALEXIN 250 MG PO CAPS
250.0000 mg | ORAL_CAPSULE | Freq: Three times a day (TID) | ORAL | Status: AC
  Administered 2016-01-02 – 2016-01-08 (×20): 250 mg via ORAL
  Filled 2016-01-01 (×20): qty 1

## 2016-01-01 MED ORDER — PREGABALIN 25 MG PO CAPS
25.0000 mg | ORAL_CAPSULE | Freq: Three times a day (TID) | ORAL | Status: DC
  Administered 2016-01-01 – 2016-01-11 (×29): 25 mg via ORAL
  Filled 2016-01-01 (×29): qty 1

## 2016-01-01 NOTE — Progress Notes (Signed)
Physical Therapy Session Note  Patient Details  Name: MYKAYLAH BALLMAN MRN: [id number] Date of Birth: [birth year]  Today's Date: 01/01/2016 PT Individual Time: 1131-1150 PT Individual Time Calculation (min): 19 min   Short Term Goals: Week 2:  PT Short Term Goal 1 (Week 2): Pt will be able to tolerate OOB x 2 hours at a time in w/c PT Short Term Goal 2 (Week 2): Pt will be able to demonstrate sitting balance with max assist during functional task EOM PT Short Term Goal 3 (Week 2): Pt will be able to perform transfers with max assist  Skilled Therapeutic Interventions/Progress Updates:    Pt received resting in w/c, c/o pain in neck but does not rate, declining therapy initially, but agreeable to positioning and LE stretching.  Pt performed anterior and lateral weight shifts with total assist and PT scooted pt back in chair total assist.  Pt requesting positioning for comfort and able to self direct care for PT to place pillows underneath BUEs, but continues to c/o pain and discomfort in neck.  PT inclined chair up and placed towel roll beneath lower portion of neck with pt reporting mild improvement in comfort.  Pt agreeable to LE stretching and PT provided PROM stretching to bilateral heel cords and hamstrings 3x30 seconds.  Pt able to perform AROM ankle pumps x15 on LLE and AAROM ankle pumps x10 on R.  Pt declining further therapy.  Pt left up in w/c with QRB in place, call bell in reach and needs met.   Therapy Documentation Precautions:  Precautions Precautions: Fall, Cervical Required Braces or Orthoses: Cervical Brace Cervical Brace: Hard collar, At all times Restrictions Weight Bearing Restrictions: No General: PT Amount of Missed Time (min): 11 Minutes PT Missed Treatment Reason: Pain Vital Signs:  Pain: Pain Assessment Pain Score: 9  Pain Location: Neck Pain Descriptors / Indicators: Aching Pain Intervention(s): Medication (See eMAR);RN made aware;Ambulation/increased  activity;Repositioned Mobility:   Locomotion :    Trunk/Postural Assessment :    Balance:   Exercises:   Other Treatments:     See Function Navigator for Current Functional Status.   Therapy/Group: Individual Therapy  Earnest Conroy Penven-Crew 01/01/2016, 12:23 PM

## 2016-01-01 NOTE — Progress Notes (Signed)
Speech Language Pathology Daily Session Note  Patient Details  Name: [name] MRN: [id number] Date of Birth: 1946-04-04  Today's Date: 01/01/2016 SLP Individual Time: 1000-1100 SLP Individual Time Calculation (min): 60 min  Short Term Goals: Week 2: SLP Short Term Goal 1 (Week 2): Pt will complete mildly complex reasoning tasks with 90% accuracy and Mod A cues.  SLP Short Term Goal 2 (Week 2): Pt will increase intelligibility at the sentence level for 90% intelligibity and Min A  for use of compensatory strategies.  SLP Short Term Goal 3 (Week 2): Pt will complete money management tasks with 90% accuracy and Mod A cues.  SLP Short Term Goal 4 (Week 2): Pt will complete medication management tasks with 90% accuracy and Mod A cues.  SLP Short Term Goal 5 (Week 2): Pt will consume trials of puree without s/s of aspiration/dysphagia with Min A for use of compensatory strategies.   Skilled Therapeutic Interventions: Skilled treatment session focused on addressing dysphagia and cognition goals. Patient reported 9/10 pain, but required cues to maintain arousal throughout session today.  SLP facilitated session by providing set up assist of Dys.1 textures and thin liquids via straw.  Patient with multiple swallows 5-8 per bolus with intermittent throat clears and cough with reports of things getting stuck in throat across consistencies and Max assist verbal cues to recall alternating between bites and sips.  Patient with noted decline in function today; plan to discuss need for objective assessment with MD.  SLP also facilitated session with functional, daily math problems, which patient completed with Mod increased to Max assist verbal and visual cues as session progressed and fatigue increased.  Patient apologized for her fatigue today and verbalized awareness of "doing worse today."     Addendum: discussed dysphagia concerns with MD in team conference and he reports that given the change in  cognition as well as swallow function he will adjust pain medications, due to likely being too sedating.  Will continue to follow closely.    Function:  Cognition Comprehension Comprehension assist level: Understands basic 90% of the time/cues < 10% of the time  Expression   Expression assist level: Expresses basic 90% of the time/requires cueing < 10% of the time.  Social Interaction Social Interaction assist level: Interacts appropriately 90% of the time - Needs monitoring or encouragement for participation or interaction.  Problem Solving Problem solving assist level: Solves basic 90% of the time/requires cueing < 10% of the time  Memory Memory assist level: Recognizes or recalls 75 - 89% of the time/requires cueing 10 - 24% of the time    Pain Pain Assessment Pain Assessment: No/denies pain Pain Score: 9  Pain Location: Neck Pain Descriptors / Indicators: Aching Pain Intervention(s): Premedicated   Therapy/Group: Individual Therapy  Carmelia Roller., [place] [id number]  [place] 01/01/2016, 12:32 PM

## 2016-01-01 NOTE — Progress Notes (Signed)
Occupational Therapy Session Note  Patient Details  Name: [name] MRN: [id number] Date of Birth: [birth year]  Today's Date: 01/01/2016 OT Individual Time: [id number] OT Individual Time Calculation (min): 60 min    Short Term Goals: Week 2:  OT Short Term Goal 1 (Week 2): Pt will maintain static sitting balance seated EOM with close supervision in prep for functional task OT Short Term Goal 2 (Week 2): Pt will don shirt with mod A with set-up OT Short Term Goal 3 (Week 2): Pt will direct caregiver with bed mobility with 1 VCs in prep for functional rolling.  Skilled Therapeutic Interventions/Progress Updates:    Pt seen for OT ADL session focusing on functional mobililty, ADL re-training, and positioning. Pt in supine upon arrival with RN present administering medications. She was agreeable to tx session. Pants and TED hose donned total A. She rolled with max A rolling to L, able to assist with raising R knee in prep for rolling. She required total A rolling to L. Pt able to direct caregier with technique of log rolling and where to assist independently.  She requested to have neck washed and brace pads changed. Performed total A in supine. [name] educated regarding care and maintence of ASPEN collar.  Retrieved new w/c for pt. Pt now in reclining w/c. She voiced increased comfort in back/ shoulders with elongated backrest. QRB donned for safety. Total A transfer with +2 for safety completed to w/c with block placed under pt's feet for support.  Shirt donned total A in chair with +2 to come forward to pull shirt down in back. Pt able to move arms slightly to assist with threading shirt down arms.  Pt left reclined in w/c with soft touch call bell in reach.   Therapy Documentation Precautions:  Precautions Precautions: Fall, Cervical Required Braces or Orthoses: Cervical Brace Cervical Brace: Hard collar, At all times Restrictions Weight Bearing Restrictions:  No Pain: Pain Assessment Pain Score: 9  Pain Location: Neck Pain Descriptors / Indicators: Aching Pain Intervention(s): Medication (See eMAR);RN made aware;Ambulation/increased activity;Repositioned  See Function Navigator for Current Functional Status.   Therapy/Group: Individual Therapy  Lewis, Adelaido Nicklaus C 01/01/2016, 7:07 AM

## 2016-01-01 NOTE — Patient Care Conference (Addendum)
Inpatient RehabilitationTeam Conference and Plan of Care Update Date: 01/01/2016   Time: 2:00 PM    Patient Name: [name]      Medical Record Number: [id number]  Date of Birth: June 25, 1946 Sex: Female         Room/Bed: 4W07C/4W07C-01 Payor Info: Payor: MEDICARE / Plan: MEDICARE PART A AND B / Product Type: *No Product type* /    Admitting Diagnosis: cervical myelopathy  Admit Date/Time:  12/21/2015 12:59 PM Admission Comments: No comment available   Primary Diagnosis:  <principal problem not specified> Principal Problem: <principal problem not specified>  Patient Active Problem List   Diagnosis Date Noted  . E. coli UTI   . Myelopathy ([place]) 12/21/2015  . Abnormality of gait   . Acute incomplete spastic tetraplegia ([place])   . Fibromyalgia   . Chronic back pain   . Anxiety state   . Benign essential HTN   . Slow transit constipation   . HLD (hyperlipidemia)   . Urinary frequency   . Cervical spondylosis with myelopathy 12/18/2015  . Chest pain 03/27/2014  . Unspecified constipation 12/02/2013  . Hypokalemia 05/31/2013  . Insomnia 05/29/2011  . Lipoma 02/05/2011  . HYPERLIPIDEMIA 09/24/2006  . Depression 04/29/2006  . Essential hypertension 04/29/2006  . Myalgia and myositis 04/29/2006  . GERD 03/15/1998    Expected Discharge Date: Expected Discharge Date:  (SNF)  Team Members Present: Physician leading conference: Dr. Alger Simons Social Worker Present: Lennart Pall, LCSW Nurse Present: Heather Roberts, RN PT Present: Canary Brim, PT OT Present: Napoleon Form, OT SLP Present: Gunnar Fusi, SLP PPS Coordinator present : Daiva Nakayama, RN, CRRN     Current Status/Progress Goal Weekly Team Focus  Medical   slow neuro progress. continued pain likely complicated by FMS  increased activity tolerance  pain control, nutrition, education   Bowel/Bladder   Requires I&O cath q 6-8hrs, Incontinent of bowel. LBM 12/31/15  Managed bowel and bladder program  Initiate bowel  program    Swallow/Nutrition/ Hydration   Full liquid diet, Sueprvision-Min A for use of strategies with increase in overt s/s of aspiration  Min A   tolerance of current diet and trials of advanced textures    ADL's   Max- total A +2 overall for ADLs; Total A functional transfers with +2 for safety via sliding board  Modified to max A overall with pt directing caregiver  Pain management; ADL re-training; pr directiong care; functional positioning.    Mobility   max to total assist w/c level  downgraded to mod/max assist overall w/c level due to minimal progress  education, OOB tolerance, transfers, balance, trunk control, standing with lift equipment   Communication   Min-Mod A  Mod I  increase arousal and use of speech intelligibilty strategies    Safety/Cognition/ Behavioral Observations  Max-Max A  Min A  problem solving with familiar and functional tasks    Pain   Percocet 1-2tabs q 4hrs prn, Scheduled Lyrica and Robaxin   <2  Premedicate pt prior to initial therapy session   Skin   R neck incision slightly edemaous, cervical collar on at all times  No additional skin breakdown  Assess skin daily      *See Care Plan and progress notes for long and short-term goals.  Barriers to Discharge: severity of neuro deficits    Possible Resolutions to Barriers:  adaptive equipment/wheelchair, family ed    Discharge Planning/Teaching Needs:  Home with sister and brother-in-law who can provide 24/7 assistance.  May  need to consider SNF if sister does not feel she can provide needed assist.  to be scheduled   Team Discussion:  More lethargic and this is likely due to medications.  Plan to decreased pain meds.  Currently total assist +2.  Constant pain complaints but does try to work through with therapies.  SW reports expect plan to change to SNF as sister cannot meet care needs - to confirm by tomorrow.  showing overt s/s of swallowing issues - may be r/t level of alertness due to meds  but may need to do another swallow study.  Revisions to Treatment Plan:  Expect change in d/c plan to SNF   Continued Need for Acute Rehabilitation Level of Care: The patient requires daily medical management by a physician with specialized training in physical medicine and rehabilitation for the following conditions: Daily direction of a multidisciplinary physical rehabilitation program to ensure safe treatment while eliciting the highest outcome that is of practical value to the patient.: Yes Daily medical management of patient stability for increased activity during participation in an intensive rehabilitation regime.: Yes Daily analysis of laboratory values and/or radiology reports with any subsequent need for medication adjustment of medical intervention for : Neurological problems;Post surgical problems  Shondrika Hoque 01/02/2016, 10:43 AM

## 2016-01-01 NOTE — Progress Notes (Signed)
Physical Therapy Session Note  Patient Details  Name: AIDAH HOLIMAN MRN: [id number] Date of Birth: 03-Mar-1946  Today's Date: 01/01/2016 PT Individual Time: [id number] PT Individual Time Calculation (min): 54 min   Short Term Goals: Week 2:  PT Short Term Goal 1 (Week 2): Pt will be able to tolerate OOB x 2 hours at a time in w/c PT Short Term Goal 2 (Week 2): Pt will be able to demonstrate sitting balance with max assist during functional task EOM PT Short Term Goal 3 (Week 2): Pt will be able to perform transfers with max assist  Skilled Therapeutic Interventions/Progress Updates:   Session focused on adjusting new w/c (pt now in recliner due to malfunction on TIS earlier in the day), functional transfers with slideboard with emphasis on pt attempting to initiate with minimal success (still requiring total assist), neuro re-ed for sitting balance and anterior/posterior weighshifts holding onto therapy ball (with rehab tech supporting UE's on the ball) and overall endurance/activity tolerance. Pt very fatigued during session and difficulty engaging and participating. End of session returned back to bed with total assist and positioned for comfort. Multimodal cues throughout session for initiation, technique, and sequencing.   Therapy Documentation Precautions:  Precautions Precautions: Fall, Cervical Required Braces or Orthoses: Cervical Brace Cervical Brace: Hard collar, At all times Restrictions Weight Bearing Restrictions: No  Pain: C/o cervical pain - pt reports she had received medication just before. End of session, positioned in supine for comfort.    See Function Navigator for Current Functional Status.   Therapy/Group: Individual Therapy  Canary Brim Ivory Broad, PT, DPT  01/01/2016, 2:11 PM

## 2016-01-01 NOTE — Progress Notes (Signed)
Subjective/Complaints: Complaining about c-collar being too tight---"can't breathe"  Review of systems denies any breathing problems no chest pain no shortness of breath, negative for nausea, vomiting, diarrhea  Objective: Vital Signs: Blood pressure 113/58, pulse 68, temperature 97.9 F (36.6 C), temperature source Oral, resp. rate 18, height '5\' 9"'  (1.753 m), weight 61.689 kg (136 lb), last menstrual period 03/31/2004, SpO2 100 %. No results found. Results for orders placed or performed during the hospital encounter of 12/21/15 (from the past 72 hour(s))  Creatinine, serum     Status: None   Collection Time: 01/01/16  4:58 AM  Result Value Ref Range   Creatinine, Ser 0.52 0.44 - 1.00 mg/dL   GFR calc non Af Amer >60 >60 mL/min   GFR calc Af Amer >60 >60 mL/min    Comment: (NOTE) The eGFR has been calculated using the CKD EPI equation. This calculation has not been validated in all clinical situations. eGFR's persistently <60 mL/min signify possible Chronic Kidney Disease.      General: No acute distress. Vital signs reviewed. Psych: Mood is  flat. Behavior is appropriate Heart: Regular rate and rhythm no rubs murmurs or extra sounds Lungs: Clear to auscultation, breathing unlabored, no rales or wheezes Abdomen: Positive bowel sounds, soft nontender to palpation, nondistended Skin: No evidence of breakdown, no evidence of rash Neurologic:  Motor strength is  Right upper extremity: 1+/5 proximal to distal Left upper extremity: 1/5 proximal to distal Right lower extremity: 2+/5 proximal distal Left lower extremity: 1/5 proximal to distal Sensory exam absent in bilateral lower extremities to light touch Musculoskeletal: No edema. No tenderness.  Assessment/Plan: 1. 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 Physiatrist is providing close team  supervision and 24 hour management of active medical problems listed below. Physiatrist and rehab team continue to assess barriers to discharge/monitor patient progress toward functional and medical goals. FIM: Function - Bathing Bathing activity did not occur:  (Night Bath) Position: Other (comment) (Recliner) Body parts bathed by patient: Right arm, Left arm, Chest, Abdomen (Hand over hand assist) Body parts bathed by helper: Front perineal area, Buttocks Bathing not applicable: Right upper leg, Left upper leg, Right lower leg, Left lower leg, Back Assist Level: Touching or steadying assistance(Pt > 75%) (hand over hand assist using bath mit)  Function- Upper Body Dressing/Undressing What is the patient wearing?: Pull over shirt/dress Pull over shirt/dress - Perfomed by patient: Thread/unthread right sleeve, Thread/unthread left sleeve Pull over shirt/dress - Perfomed by helper: Put head through opening, Pull shirt over trunk, Thread/unthread left sleeve, Thread/unthread right sleeve Assist Level: 2 helpers Function - Lower Body Dressing/Undressing What is the patient wearing?: Pants, Non-skid slipper socks, Ted Hose Position: Bed Pants- Performed by helper: Thread/unthread right pants leg, Thread/unthread left pants leg, Pull pants up/down Non-skid slipper socks- Performed by helper: Don/doff right sock, Don/doff left sock TED Hose - Performed by helper: Don/doff right TED hose, Don/doff left TED hose Assist for footwear: Dependant Assist for lower body dressing: 2 Helpers  Function - Toileting Toileting activity did not occur: No continent bowel/bladder event Toileting steps completed by patient: Adjust clothing prior to toileting, Performs perineal hygiene, Adjust clothing after toileting (per Berkley Harvey, Nt report) Assist level: Two helpers (Per [place], NT report)  Function - Air cabin crew transfer activity did not occur: Safety/medical concerns  Function -  Chair/bed transfer Chair/bed transfer method: Lateral scoot Chair/bed transfer assist  level: 2 helpers Chair/bed transfer assistive device: Sliding board Chair/bed transfer details: Manual facilitation for placement, Manual facilitation for weight shifting, Manual facilitation for weight bearing, Verbal cues for safe use of DME/AE, Verbal cues for precautions/safety, Verbal cues for technique  Function - Locomotion: Wheelchair Will patient use wheelchair at discharge?: Yes Type: Manual Wheelchair activity did not occur: Safety/medical concerns Assist Level: Dependent (Pt equals 0%) Wheel 50 feet with 2 turns activity did not occur: Safety/medical concerns Assist Level: Dependent (Pt equals 0%) Wheel 150 feet activity did not occur: Safety/medical concerns Assist Level: Dependent (Pt equals 0%) Function - Locomotion: Ambulation Ambulation activity did not occur: Safety/medical concerns Walk 10 feet activity did not occur: Safety/medical concerns Walk 50 feet with 2 turns activity did not occur: Safety/medical concerns Walk 150 feet activity did not occur: Safety/medical concerns Walk 10 feet on uneven surfaces activity did not occur: Safety/medical concerns  Function - Comprehension Comprehension: Auditory Comprehension assist level: Understands basic 90% of the time/cues < 10% of the time  Function - Expression Expression: Verbal Expression assist level: Expresses basic 90% of the time/requires cueing < 10% of the time.  Function - Social Interaction Social Interaction assist level: Interacts appropriately 90% of the time - Needs monitoring or encouragement for participation or interaction.  Function - Problem Solving Problem solving assist level: Solves basic 90% of the time/requires cueing < 10% of the time  Function - Memory Memory assist level: Recognizes or recalls 75 - 89% of the time/requires cueing 10 - 24% of the time Patient normally able to recall (first 3 days only):  Current season, Location of own room, Staff names and faces, That he or she is in a hospital  Medical Problem List and Plan: 1.  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 Cervical collar at all times   - Continue CIR. Team conference today 2.  DVT Prophylaxis/Anticoagulation: dopplers negative  -changed to sq lovenox  -Vascular ultrasound negative for lower extremity DVT on 5/31 3. Pain Management/fibromyalgia/chronic back pain: Chronic prednisone 10 mg twice a day,  Oxycodone and Zanaflex as needed,    -Lyrica increased to 58m TID  -scheduled robaxin for neck/shoulder girdle pain  4. Mood: Xanax 0.5 mg twice a day 5. Neuropsych: This patient is capable of making decisions on her own behalf. 6. Skin/Wound Care: Routine skin checks 7. Fluids/Electrolytes/Nutrition: intake suboptimal  -megace for appetite 8. Hypertension. Hydrochlorothiazide 25 mg daily, lisinopril 20 mg daily. Fair control 9. Constipation. Laxative assistance 10. Hyperlipidemia.Lovaza 11. Neurogenic bladder  continue ICP program  Has no sense of bladder filling  -cath volumes 300-600 12. Hypoalbuminemia--added pro-stat 13. Escherichia coli UTI  Macrobid 7 days started on 6/3  LOS (Days) 11 A FACE TO FACE EVALUATION WAS PERFORMED  Shaquna Geigle T 01/01/2016, 8:41 AM

## 2016-01-02 ENCOUNTER — Inpatient Hospital Stay (HOSPITAL_COMMUNITY): Payer: Medicare Other | Admitting: Speech Pathology

## 2016-01-02 ENCOUNTER — Inpatient Hospital Stay (HOSPITAL_COMMUNITY): Payer: Medicare Other | Admitting: Occupational Therapy

## 2016-01-02 ENCOUNTER — Inpatient Hospital Stay (HOSPITAL_COMMUNITY): Payer: Medicare Other | Admitting: Physical Therapy

## 2016-01-02 LAB — BASIC METABOLIC PANEL
ANION GAP: 9 (ref 5–15)
BUN: 14 mg/dL (ref 6–20)
CALCIUM: 8.9 mg/dL (ref 8.9–10.3)
CO2: 26 mmol/L (ref 22–32)
Chloride: 98 mmol/L — ABNORMAL LOW (ref 101–111)
Creatinine, Ser: 0.58 mg/dL (ref 0.44–1.00)
Glucose, Bld: 93 mg/dL (ref 65–99)
Potassium: 3.7 mmol/L (ref 3.5–5.1)
SODIUM: 133 mmol/L — AB (ref 135–145)

## 2016-01-02 MED ORDER — BOOST / RESOURCE BREEZE PO LIQD
1.0000 | Freq: Three times a day (TID) | ORAL | Status: DC
  Administered 2016-01-02 – 2016-01-15 (×33): 1 via ORAL

## 2016-01-02 NOTE — Progress Notes (Signed)
Social Work Patient ID: [name], female   DOB: [birth year], 70 y.o.   MRN: [id number]   Have reviewed team conference with pt and sister (via phone).  Both understand that pt continues to make slow progress and goals are not set at a level that sister is expected to be able to provide.  They report they have already talked about this concern and are agreed to change d/c plan to SNF.  Left Medicaid application in pt's room for sister to assist pt with completing.    Susannah Carbin, LCSW

## 2016-01-02 NOTE — Progress Notes (Signed)
Social Work Patient ID: [name], female   DOB: 07/16/1946, 70 y.o.   MRN: [id number]  Lowella Curb, LCSW Social Worker Addendum  Patient Care Conference 01/01/2016  3:12 PM    Expand All Collapse All   Inpatient RehabilitationTeam Conference and Plan of Care Update Date: 01/01/2016   Time: 2:00 PM     Patient Name: [name]       Medical Record Number: [id number]  Date of Birth: [birth year] Sex: Female         Room/Bed: 4W07C/4W07C-01 Payor Info: Payor: MEDICARE / Plan: MEDICARE PART A AND B / Product Type: *No Product type* /    Admitting Diagnosis: cervical myelopathy  Admit Date/Time:  12/21/2015 12:59 PM Admission Comments: No comment available   Primary Diagnosis:  <principal problem not specified> Principal Problem: <principal problem not specified>    Patient Active Problem List     Diagnosis  Date Noted   .  E. coli UTI     .  Myelopathy ([place])  12/21/2015   .  Abnormality of gait     .  Acute incomplete spastic tetraplegia ([place])     .  Fibromyalgia     .  Chronic back pain     .  Anxiety state     .  Benign essential HTN     .  Slow transit constipation     .  HLD (hyperlipidemia)     .  Urinary frequency     .  Cervical spondylosis with myelopathy  12/18/2015   .  Chest pain  03/27/2014   .  Unspecified constipation  12/02/2013   .  Hypokalemia  05/31/2013   .  Insomnia  05/29/2011   .  Lipoma  02/05/2011   .  HYPERLIPIDEMIA  09/24/2006   .  Depression  04/29/2006   .  Essential hypertension  04/29/2006   .  Myalgia and myositis  04/29/2006   .  GERD  03/15/1998     Expected Discharge Date: Expected Discharge Date:  (SNF)  Team Members Present: Physician leading conference: Dr. Alger Simons Social Worker Present: Lennart Pall, LCSW Nurse Present: Heather Roberts, RN PT Present: Canary Brim, PT OT Present: Napoleon Form, OT SLP Present: Gunnar Fusi, SLP PPS Coordinator present : Daiva Nakayama, RN, CRRN        Current Status/Progress   Goal  Weekly Team Focus   Medical     slow neuro progress. continued pain likely complicated by FMS   increased activity tolerance  pain control, nutrition, education    Bowel/Bladder     Requires I&O cath q 6-8hrs, Incontinent of bowel. LBM 12/31/15  Managed bowel and bladder program  Initiate bowel program    Swallow/Nutrition/ Hydration     Full liquid diet, Sueprvision-Min A for use of strategies with increase in overt s/s of aspiration  Min A   tolerance of current diet and trials of advanced textures     ADL's     Max- total A +2 overall for ADLs; Total A functional transfers with +2 for safety via sliding board  Modified to max A overall with pt directing caregiver   Pain management; ADL re-training; pr directiong care; functional positioning.    Mobility     max to total assist w/c level  downgraded to mod/max assist overall w/c level due to minimal progress  education, OOB tolerance, transfers, balance, trunk control, standing with lift equipment   Communication     Min-Mod  A  Mod I  increase arousal and use of speech intelligibilty strategies    Safety/Cognition/ Behavioral Observations    Max-Max A  Min A  problem solving with familiar and functional tasks    Pain     Percocet 1-2tabs q 4hrs prn, Scheduled Lyrica and Robaxin    <2  Premedicate pt prior to initial therapy session    Skin     R neck incision slightly edemaous, cervical collar on at all times  No additional skin breakdown  Assess skin daily      *See Care Plan and progress notes for long and short-term goals.    Barriers to Discharge:  severity of neuro deficits     Possible Resolutions to Barriers:   adaptive equipment/wheelchair, family ed     Discharge Planning/Teaching Needs:   Home with sister and brother-in-law who can provide 24/7 assistance.  May need to consider SNF if sister does not feel she can provide needed assist.  to be scheduled    Team Discussion:    More lethargic and this is likely  due to medications.  Plan to decreased pain meds.  Currently total assist +2.  Constant pain complaints but does try to work through with therapies.  SW reports expect plan to change to SNF as sister cannot meet care needs - to confirm by tomorrow.  showing overt s/s of swallowing issues - may be r/t level of alertness due to meds but may need to do another swallow study.   Revisions to Treatment Plan:    Expect change in d/c plan to SNF    Continued Need for Acute Rehabilitation Level of Care: The patient requires daily medical management by a physician with specialized training in physical medicine and rehabilitation for the following conditions: Daily direction of a multidisciplinary physical rehabilitation program to ensure safe treatment while eliciting the highest outcome that is of practical value to the patient.: Yes Daily medical management of patient stability for increased activity during participation in an intensive rehabilitation regime.: Yes Daily analysis of laboratory values and/or radiology reports with any subsequent need for medication adjustment of medical intervention for : Neurological problems;Post surgical problems  Huie Ghuman 01/02/2016, 10:43 AM        Revision History      Date/Time User Provider Type Action    01/02/2016 10:44 AM Lowella Curb, LCSW Social Worker Addend    01/01/2016  3:17 PM Lowella Curb, LCSW Social Worker Sign    View Details Report                 Lowella Curb, [place] Worker Signed  Patient Care Conference 12/26/2015  1:25 PM    Expand All Collapse All   Inpatient RehabilitationTeam Conference and Plan of Care Update Date: 12/25/2015   Time: 2:20 PM     Patient Name: [place] Record Number: [id number]  Date of Birth: 1946/02/23 Sex: Female         Room/Bed: 4W07C/4W07C-01 Payor Info: Payor: MEDICARE / Plan: MEDICARE PART A AND B / Product Type: *No Product type* /    Admitting Diagnosis: cervical myelopathy    Admit Date/Time:  12/21/2015 12:59 PM Admission Comments: No comment available   Primary Diagnosis:  <principal problem not specified> Principal Problem: <principal problem not specified>    Patient Active Problem List     Diagnosis  Date Noted   .  Myelopathy ([place])  12/21/2015   .  Abnormality of gait     .  Acute incomplete spastic tetraplegia ([place])     .  Fibromyalgia     .  Chronic back pain     .  Anxiety state     .  Benign essential HTN     .  Slow transit constipation     .  HLD (hyperlipidemia)     .  Urinary frequency     .  Cervical spondylosis with myelopathy  12/18/2015   .  Chest pain  03/27/2014   .  Unspecified constipation  12/02/2013   .  Hypokalemia  05/31/2013   .  Insomnia  05/29/2011   .  Lipoma  02/05/2011   .  HYPERLIPIDEMIA  09/24/2006   .  Depression  04/29/2006   .  Essential hypertension  04/29/2006   .  Myalgia and myositis  04/29/2006   .  GERD  03/15/1998     Expected Discharge Date: Expected Discharge Date: 01/18/16  Team Members Present: Physician leading conference: Dr. Alger Simons Social Worker Present: Lennart Pall, LCSW Nurse Present: Dorien Chihuahua, RN PT Present: Jorge Mandril, PT OT Present: Napoleon Form, OT SLP Present: Weston Anna, SLP PPS Coordinator present : Daiva Nakayama, RN, CRRN        Current Status/Progress  Goal  Weekly Team Focus   Medical     cervical myelpahty s/p C3-6 fusion. incomplete SCI. neruogenic bowel and bladder  neurological improvement  bowel and bladder mgt, skin care, pain control    Bowel/Bladder     in & out cath q6-8 hrs, incontinent LBM 5/28  able to void, continent  monitor   Swallow/Nutrition/ Hydration     Full liquid diet, Supervision-Min A for use of strategies   Min A   tolerance of current diet, use of swallow strateiges, possible trials of upgraded textures    ADL's     Total A LB dressing; max- total A UB dressing; Total A functional transfers using sliding board; max- total self  feeding and grooming   Min A overall  Pain management, ADL re-training; sitting balance    Mobility     Total A for bed mobility, slideboard transfers, w/c mobility in tilt in space, standing  Min A overall  Pain management, balance/trunk control, transfers and WB through LE   Communication     Supervision-Min A  Mod I  increased use of speech intelligibility strategies    Safety/Cognition/ Behavioral Observations    Mod A  Min A  problem solving with functional tasks    Pain     percocet 1-2 tab q4 hrs PRN, scheduled Lyrica. Requires percocet only BID most days  < 2      Skin     neck incisions with dermabond, cervical collar   free of skin breakdown & infection   monitor skin    Rehab Goals Patient on target to meet rehab goals: Yes *See Care Plan and progress notes for long and short-term goals.    Barriers to Discharge:  level of injury, severity      Possible Resolutions to Barriers:   continued education, assistance at home for bowel/bladder/care     Discharge Planning/Teaching Needs:   Home with sister and brother-in-law who can provide 24/7 assistance.  to be scheduled    Team Discussion:    currrently max - total assistance with PT/OT.  Still on full liquid and not ready to upgrade.  Picking up on mild cognitive deficits i.e.memory, money  management and recall.  Significantly weak and anticipate longer LOS>   Revisions to Treatment Plan:    None    Continued Need for Acute Rehabilitation Level of Care: The patient requires daily medical management by a physician with specialized training in physical medicine and rehabilitation for the following conditions: Daily direction of a multidisciplinary physical rehabilitation program to ensure safe treatment while eliciting the highest outcome that is of practical value to the patient.: Yes Daily medical management of patient stability for increased activity during participation in an intensive rehabilitation regime.: Yes Daily  analysis of laboratory values and/or radiology reports with any subsequent need for medication adjustment of medical intervention for : Post surgical problems;Urological problems;Wound care problems;Neurological problems  Ariq Khamis 12/26/2015, 3:19 PM

## 2016-01-02 NOTE — Progress Notes (Signed)
Subjective/Complaints: Up working with OT. Slept about 4 hours. Had pain. "why am i on appetite medication if they aren't going to give me anything good to eat?"  Review of systems denies any breathing problems no chest pain no shortness of breath, negative for nausea, vomiting, diarrhea  Objective: Vital Signs: Blood pressure 107/61, pulse 66, temperature 97.9 F (36.6 C), temperature source Tympanic, resp. rate 18, height 5' 9" (1.753 m), weight 61.689 kg (136 lb), last menstrual period 03/31/2004, SpO2 100 %. No results found. Results for orders placed or performed during the hospital encounter of 12/21/15 (from the past 72 hour(s))  Creatinine, serum     Status: None   Collection Time: 01/01/16  4:58 AM  Result Value Ref Range   Creatinine, Ser 0.52 0.44 - 1.00 mg/dL   GFR calc non Af Amer >60 >60 mL/min   GFR calc Af Amer >60 >60 mL/min    Comment: (NOTE) The eGFR has been calculated using the CKD EPI equation. This calculation has not been validated in all clinical situations. eGFR's persistently <60 mL/min signify possible Chronic Kidney Disease.   Basic metabolic panel     Status: Abnormal   Collection Time: 01/01/16  2:48 PM  Result Value Ref Range   Sodium 133 (L) 135 - 145 mmol/L   Potassium 3.4 (L) 3.5 - 5.1 mmol/L   Chloride 100 (L) 101 - 111 mmol/L   CO2 26 22 - 32 mmol/L   Glucose, Bld 147 (H) 65 - 99 mg/dL   BUN 20 6 - 20 mg/dL   Creatinine, Ser 0.64 0.44 - 1.00 mg/dL   Calcium 8.9 8.9 - 10.3 mg/dL   GFR calc non Af Amer >60 >60 mL/min   GFR calc Af Amer >60 >60 mL/min    Comment: (NOTE) The eGFR has been calculated using the CKD EPI equation. This calculation has not been validated in all clinical situations. eGFR's persistently <60 mL/min signify possible Chronic Kidney Disease.    Anion gap 7 5 - 15  CBC     Status: Abnormal   Collection Time: 01/01/16  2:48 PM  Result Value Ref Range   WBC 14.6 (H) 4.0 - 10.5 K/uL   RBC 2.82 (L) 3.87 - 5.11  MIL/uL   Hemoglobin 8.6 (L) 12.0 - 15.0 g/dL   HCT 26.4 (L) 36.0 - 46.0 %   MCV 93.6 78.0 - 100.0 fL   MCH 30.5 26.0 - 34.0 pg   MCHC 32.6 30.0 - 36.0 g/dL   RDW 13.1 11.5 - 15.5 %   Platelets 267 150 - 400 K/uL  Basic metabolic panel     Status: Abnormal   Collection Time: 01/02/16  5:37 AM  Result Value Ref Range   Sodium 133 (L) 135 - 145 mmol/L   Potassium 3.7 3.5 - 5.1 mmol/L   Chloride 98 (L) 101 - 111 mmol/L   CO2 26 22 - 32 mmol/L   Glucose, Bld 93 65 - 99 mg/dL   BUN 14 6 - 20 mg/dL   Creatinine, Ser 0.58 0.44 - 1.00 mg/dL   Calcium 8.9 8.9 - 10.3 mg/dL   GFR calc non Af Amer >60 >60 mL/min   GFR calc Af Amer >60 >60 mL/min    Comment: (NOTE) The eGFR has been calculated using the CKD EPI equation. This calculation has not been validated in all clinical situations. eGFR's persistently <60 mL/min signify possible Chronic Kidney Disease.    Anion gap 9 5 - 15  General: No acute distress. Vital signs reviewed. Psych: Mood is up beat. More alert today.  Heart: Regular rate and rhythm no rubs murmurs or extra sounds Lungs: Clear to auscultation, breathing unlabored, no rales or wheezes Abdomen: Positive bowel sounds, soft nontender to palpation, nondistended Skin: No evidence of breakdown, no evidence of rash Neurologic: speech clear  Motor strength:  Right upper extremity: 1+/5 proximal to distal--c6 level Left upper extremity: 1/5 proximal to distal--c5 level Right lower extremity: 2+/5 proximal distal Left lower extremity: 1/5 proximal to distal Sensory exam absent in bilateral lower extremities to light touch Musculoskeletal: No edema. No tenderness.  Assessment/Plan: 1. 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 Physiatrist is providing close team supervision and 24 hour management of active medical problems listed  below. Physiatrist and rehab team continue to assess barriers to discharge/monitor patient progress toward functional and medical goals. FIM: Function - Bathing Bathing activity did not occur:  (Night Bath) Position: Other (comment) (Recliner) Body parts bathed by patient: Right arm, Left arm, Chest, Abdomen (Hand over hand assist) Body parts bathed by helper: Front perineal area, Buttocks Bathing not applicable: Right upper leg, Left upper leg, Right lower leg, Left lower leg, Back Assist Level: Touching or steadying assistance(Pt > 75%) (hand over hand assist using bath mit)  Function- Upper Body Dressing/Undressing What is the patient wearing?: Pull over shirt/dress Pull over shirt/dress - Perfomed by patient: Thread/unthread right sleeve, Thread/unthread left sleeve Pull over shirt/dress - Perfomed by helper: Put head through opening, Pull shirt over trunk, Thread/unthread left sleeve, Thread/unthread right sleeve Assist Level: 2 helpers Function - Lower Body Dressing/Undressing What is the patient wearing?: Pants, Non-skid slipper socks, Ted Hose Position: Bed Pants- Performed by helper: Thread/unthread right pants leg, Thread/unthread left pants leg, Pull pants up/down Non-skid slipper socks- Performed by helper: Don/doff right sock, Don/doff left sock TED Hose - Performed by helper: Don/doff right TED hose, Don/doff left TED hose Assist for footwear: Dependant Assist for lower body dressing: 2 Helpers  Function - Toileting Toileting activity did not occur: No continent bowel/bladder event Toileting steps completed by patient: Adjust clothing prior to toileting, Performs perineal hygiene, Adjust clothing after toileting (per Berkley Harvey, Nt report) Assist level: Two helpers (Per [place], NT report)  Function - Air cabin crew transfer activity did not occur: Safety/medical concerns  Function - Chair/bed transfer Chair/bed transfer method: Lateral scoot Chair/bed  transfer assist level: 2 helpers Chair/bed transfer assistive device: Sliding board Chair/bed transfer details: Manual facilitation for placement, Manual facilitation for weight shifting, Manual facilitation for weight bearing, Verbal cues for safe use of DME/AE, Verbal cues for precautions/safety, Verbal cues for technique  Function - Locomotion: Wheelchair Will patient use wheelchair at discharge?: Yes Type: Manual Wheelchair activity did not occur: Safety/medical concerns Assist Level: Dependent (Pt equals 0%) Wheel 50 feet with 2 turns activity did not occur: Safety/medical concerns Assist Level: Dependent (Pt equals 0%) Wheel 150 feet activity did not occur: Safety/medical concerns Assist Level: Dependent (Pt equals 0%) Turns around,maneuvers to table,bed, and toilet,negotiates 3% grade,maneuvers on rugs and over doorsills: No Function - Locomotion: Ambulation Ambulation activity did not occur: Safety/medical concerns Walk 10 feet activity did not occur: Safety/medical concerns Walk 50 feet with 2 turns activity did not occur: Safety/medical concerns Walk 150 feet activity did not occur: Safety/medical concerns Walk 10 feet on uneven surfaces activity did not occur: Safety/medical concerns  Function -  Comprehension Comprehension: Auditory Comprehension assist level: Understands basic 90% of the time/cues < 10% of the time  Function - Expression Expression: Verbal Expression assist level: Expresses basic 90% of the time/requires cueing < 10% of the time.  Function - Social Interaction Social Interaction assist level: Interacts appropriately 75 - 89% of the time - Needs redirection for appropriate language or to initiate interaction.  Function - Problem Solving Problem solving assist level: Solves basic 75 - 89% of the time/requires cueing 10 - 24% of the time  Function - Memory Memory assist level: Recognizes or recalls 75 - 89% of the time/requires cueing 10 - 24% of the  time Patient normally able to recall (first 3 days only): Current season, Location of own room, Staff names and faces, That he or she is in a hospital  Medical Problem List and Plan: 1.  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 Cervical collar at all times   - Continue CIR.   2.  DVT Prophylaxis/Anticoagulation: dopplers negative  -changed to sq lovenox  -Vascular ultrasound negative for lower extremity DVT on 5/31 3. Pain Management/fibromyalgia/chronic back pain: Chronic prednisone 10 mg twice a day,  Oxycodone and Zanaflex as needed,    -Lyrica decreased to 64m tid due to lethargy  -robaxin stopped   4. Mood: Xanax 0.5 mg twice a day 5. Neuropsych: This patient is capable of making decisions on her own behalf. 6. Skin/Wound Care: Routine skin checks 7. Fluids/Electrolytes/Nutrition: intake suboptimal  -megace for appetite  -I personally reviewed the patient's labs today. Replete potassium 8. Hypertension. Hydrochlorothiazide 25 mg daily, lisinopril 20 mg daily. Fair control 9. Constipation. Laxative assistance 10. Hyperlipidemia.Lovaza 11. Neurogenic bladder  continue ICP program  Has no sense of bladder filling  -cath volumes 300-600 12. Hypoalbuminemia--added pro-stat 13. Escherichia coli UTI  -restarted abx (keflex now) yesterday  -wbc's 14.6---follow up cbc in am  LOS (Days) 12 A FACE TO FACE EVALUATION WAS PERFORMED  SWARTZ,ZACHARY T 01/02/2016, 8:37 AM

## 2016-01-02 NOTE — Progress Notes (Signed)
Initial Nutrition Assessment  DOCUMENTATION CODES:   Non-severe (moderate) malnutrition in context of chronic illness  INTERVENTION:  Provide Boost Breeze po TID, each supplement provides 250 kcal and 9 grams of protein.  Encourage adequate PO intake.   NUTRITION DIAGNOSIS:   Malnutrition related to chronic illness as evidenced by moderate depletions of muscle mass, moderate depletion of body fat.  GOAL:   Patient will meet greater than or equal to 90% of their needs  MONITOR:   PO intake, Supplement acceptance, Diet advancement, Weight trends, Labs, I & O's  REASON FOR ASSESSMENT:   Low Braden    ASSESSMENT:   70 y.o. right handed female with history of fibromyalgia, hypertension. Presented 12/18/2015 for numbness and weakness involving both hands and lower extremities that has progressed over the past 6-8 months. She had also been experiencing increased neck pain.  X-rays and imaging revealed cervical spondylosis with follow-up with a C3-C6. Underwent two-stage approach discectomy at C3-4, C4-5 with decompression, placement of intravertebral biomechanical device, [employer] with C3-C6 laminectomy for decompression of spinal cord posterior segmental instrumentation 12/18/2015  Pt is currently on a full liquid diet. Meal completion has been 20-50%. Pt reports appetite has been improving since the start of Megace. Sister at bedside reports pt has been having some difficulties swallowing, which is why pt is on a liquid diet. Sister reports pt was eating well PTA with no other difficulties. Per Epic weight records, weight has been stable. Pt is agreeable to nutritional supplements to aid in caloric and protein needs. Pt refused Ensure reporting milk products causes abdominal discomfort for her. RD to order [facility]. Pt was encouraged to eat her food at meals and to drink her supplements.   Nutrition-Focused physical exam completed. Findings are moderate fat depletion, moderate  muscle depletion, and mild edema.   Labs and medications reviewed.  Diet Order:  Diet full liquid Room service appropriate?: Yes; Fluid consistency:: Thin  Skin:   (Incision on neck)  Last BM:  6/5  Height:   Ht Readings from Last 1 Encounters:  12/21/15 5\' 9"  (1.753 m)    Weight:   Wt Readings from Last 1 Encounters:  12/29/15 136 lb (61.689 kg)    Ideal Body Weight:  65.9 kg  BMI:  Body mass index is 20.07 kg/(m^2).  Estimated Nutritional Needs:   Kcal:  1650-1850  Protein:  75-85 grams  Fluid:  1.6 - 1.8 L/day  EDUCATION NEEDS:   No education needs identified at this time  Corrin Parker, MS, RD, LDN Pager # [phone] After hours/ weekend pager # [phone]

## 2016-01-02 NOTE — Progress Notes (Signed)
Occupational Therapy Session Note  Patient Details  Name: [name] MRN: [id number] Date of Birth: Sep 05, 1945  Today's Date: 01/02/2016 OT Individual Time: [id number] and 1400-1430 OT Individual Time Calculation (min): 45 min and 30 min   Short Term Goals: Week 2:  OT Short Term Goal 1 (Week 2): Pt will maintain static sitting balance seated EOM with close supervision in prep for functional task OT Short Term Goal 2 (Week 2): Pt will don shirt with mod A with set-up OT Short Term Goal 3 (Week 2): Pt will direct caregiver with bed mobility with 1 VCs in prep for functional rolling.  Skilled Therapeutic Interventions/Progress Updates:    Session One: Pt seen OT session focusing on ADL re-training. Pt in supine upon arrival, voicing pain slightly better managed and agreeable to tx session. Pants donned total A in supine, pt able to initiate some movement in R LE to assist with threading LEs into pants. +2 utilized to roll to pull pants up. He transferred to EOB with max- total A, able to assist with walking R LE off EOB. +2 sliding board to w/c. Addressed UB dressing, pt with weak elbow flexion, trace elbow extension in L elbow and weak elbow extension in R elbow to assist with donning shirt. Requires increased time and effort with all UE movement. Hand over hand provided for washing face. Pt left reclined in w/c at end of session, all needs in reach.   Session Two: Pt seen for OT session focusing on upright posture and UE AAROM. Pt in supine upon arrival, agreeable to tx session. She rolled with max- total A and +2 to transfer to EOB.  Sitting on EOB, focus on upright posture and chest expansion with therapist facilitating chest expansion for improved upright posture, requiring constant total A for positioning. While sitting in this position, Student OT facilitated AAROM in B UEs at elbows and wrists. Increased motion and strength noted in gravity eliminated position in L UE compared to  R, with trace movements noted in R UE. Scapular mobilization performed to L shoulder, she voiced increased discomfort when attempted on R shoulder. Pt returned to supine +2 total A, left with all needs in reach.   Therapy Documentation Precautions:  Precautions Precautions: Fall, Cervical Required Braces or Orthoses: Cervical Brace Cervical Brace: Hard collar, At all times Restrictions Weight Bearing Restrictions: No  See Function Navigator for Current Functional Status.   Therapy/Group: Individual Therapy  Lewis, Gitty Osterlund C 01/02/2016, 7:08 AM

## 2016-01-02 NOTE — Progress Notes (Signed)
Speech Language Pathology Daily Session Note  Patient Details  Name: [name] MRN: [id number] Date of Birth: 1946-04-01  Today's Date: 01/02/2016 SLP Individual Time: 1300-1358 SLP Individual Time Calculation (min): 58 min  Short Term Goals: Week 2: SLP Short Term Goal 1 (Week 2): Pt will complete mildly complex reasoning tasks with 90% accuracy and Mod A cues.  SLP Short Term Goal 2 (Week 2): Pt will increase intelligibility at the sentence level for 90% intelligibity and Min A  for use of compensatory strategies.  SLP Short Term Goal 3 (Week 2): Pt will complete money management tasks with 90% accuracy and Mod A cues.  SLP Short Term Goal 4 (Week 2): Pt will complete medication management tasks with 90% accuracy and Mod A cues.  SLP Short Term Goal 5 (Week 2): Pt will consume trials of puree without s/s of aspiration/dysphagia with Min A for use of compensatory strategies.   Skilled Therapeutic Interventions: Skilled treatment session focused on addressing dysphagia and cognition goals. SLP facilitated session by providing Total assist for consumption of as well as skilled observation of applesauce, jello and thin liquids via straw with significant improvements as compared to yesterday's session.  Patient demonstrated 3-4 swallows with intermittent throat clear.  With no observed instances of wet vocal quality as well as a decrease in needed swallows to clear sensed pharyngeal residue recommend to continue with current diet orders and Dys.1 texture trials with SLP.  SLP also educated patient on the importance of directing care with caregivers who are feeding her.  SLP also facilitated session with a medication management task; patient able to recall home meds as well as recognize new ones Mod I.  Following review of meds x2 patient required Min question cues to recall functions of all meds.  Continue with current plan of care.    Function:  Eating Eating   Modified Consistency  Diet: Yes Eating Assist Level: Helper feeds patient     Helper Scoops Food on Utensil: Every scoop Helper Brings Food to Mouth: Every scoop   Cognition Comprehension Comprehension assist level: Understands complex 90% of the time/cues 10% of the time  Expression   Expression assist level: Expresses complex 90% of the time/cues < 10% of the time  Social Interaction Social Interaction assist level: Interacts appropriately 90% of the time - Needs monitoring or encouragement for participation or interaction.  Problem Solving Problem solving assist level: Solves basic 75 - 89% of the time/requires cueing 10 - 24% of the time  Memory Memory assist level: Recognizes or recalls 75 - 89% of the time/requires cueing 10 - 24% of the time    Pain Pain Assessment Pain Assessment: No/pain not brought up during session   Therapy/Group: Individual Therapy  Carmelia Roller., [place] [id number]  [place] 01/02/2016, 3:25 PM

## 2016-01-02 NOTE — Progress Notes (Addendum)
Physical Therapy Session Note  Patient Details  Name: [name] MRN: [id number] Date of Birth: April 28, 1946  Today's Date: 01/02/2016 PT Individual Time: 0900-1003 PT Individual Time Calculation (min): 63 min   Short Term Goals: Week 2:  PT Short Term Goal 1 (Week 2): Pt will be able to tolerate OOB x 2 hours at a time in w/c PT Short Term Goal 2 (Week 2): Pt will be able to demonstrate sitting balance with max assist during functional task EOM PT Short Term Goal 3 (Week 2): Pt will be able to perform transfers with max assist :     Skilled Therapeutic Interventions/Progress Updates:     Pt presents in w/c and agreeable to participate in therapy.  Performed HC/gastroc stretches 30sec x 3 bilaterally  AA ankle pumps x 10, LAQ with facilitation of quad activation x 5 bilaterally, AA shoulder horizontal  abd bilaterally, and AA biceps curls, triceps ext x 10 bilaterally. Pt transferred to mat with lateral scoot on slide board with maxA x 2 and max cues for hand placement and facilitation of trunk activation for facilitation of transfer. Pt unable to sit unsupported on mat secondary to core weakness.  Performed postural activities to facilitate core activation with verbal and tactile cues for engaging abdominals however requiring max encouragement to complete action and requiring max assist for supporting upright. Performed forward flexion, ext to neutral to facilitate engaging core ms, requiring max encouragement to complete activity due to pain and fatigue. Pt returned to w/c via SB transfer with maxA x2 and returned to room with NT in room.    Therapy Documentation Precautions:  Precautions Precautions: Fall, Cervical Required Braces or Orthoses: Cervical Brace Cervical Brace: Hard collar, At all times Restrictions Weight Bearing Restrictions: No General:   Vital Signs: Therapy Vitals BP: (!) 98/48 mmHg Pain:   Pt unable to rate pain numerically, pt grimacing with all  movements.  Pt received pain meds during session.     See Function Navigator for Current Functional Status.   Therapy/Group: Individual Therapy  Karriem Muench  Jaylnn Ullery, PTA  01/02/2016, 12:16 PM

## 2016-01-03 ENCOUNTER — Inpatient Hospital Stay (HOSPITAL_COMMUNITY): Payer: Medicare Other | Admitting: Speech Pathology

## 2016-01-03 ENCOUNTER — Inpatient Hospital Stay (HOSPITAL_COMMUNITY): Payer: Medicare Other | Admitting: Occupational Therapy

## 2016-01-03 ENCOUNTER — Inpatient Hospital Stay (HOSPITAL_COMMUNITY): Payer: Medicare Other

## 2016-01-03 LAB — CBC
HEMATOCRIT: 27 % — AB (ref 36.0–46.0)
Hemoglobin: 8.6 g/dL — ABNORMAL LOW (ref 12.0–15.0)
MCH: 29.8 pg (ref 26.0–34.0)
MCHC: 31.9 g/dL (ref 30.0–36.0)
MCV: 93.4 fL (ref 78.0–100.0)
PLATELETS: 318 10*3/uL (ref 150–400)
RBC: 2.89 MIL/uL — ABNORMAL LOW (ref 3.87–5.11)
RDW: 13.4 % (ref 11.5–15.5)
WBC: 10.5 10*3/uL (ref 4.0–10.5)

## 2016-01-03 NOTE — Progress Notes (Signed)
Physical Therapy Session Note  Patient Details  Name: [name] MRN: [id number] Date of Birth: Oct 25, 1945  Today's Date: 01/03/2016 PT Individual Time: 1000-1100 PT Individual Time Calculation (min): 60 min   Short Term Goals: Week 2:  PT Short Term Goal 1 (Week 2): Pt will be able to tolerate OOB x 2 hours at a time in w/c PT Short Term Goal 2 (Week 2): Pt will be able to demonstrate sitting balance with max assist during functional task EOM PT Short Term Goal 3 (Week 2): Pt will be able to perform transfers with max assist  Skilled Therapeutic Interventions/Progress Updates:    Session focused on addressing functional slideboard transfers with emphasis on pt working to maintain postural control (max assist +2) with cues for technique, neuro re-ed to address trunk control and sitting balance seated EOM with arms supported on table and with arms supported on mat (at times pt able to maintain a few seconds with supervision with UE's supported, otherwise required total A), and overall endurance/activity tolerance. End of session returned back to bed for cathing needs with +2 assist. NT present in room upon exit.   Therapy Documentation Precautions:  Precautions Precautions: Fall, Cervical Required Braces or Orthoses: Cervical Brace Cervical Brace: Hard collar, At all times Restrictions Weight Bearing Restrictions: No  Pain:  c/o neck pain - premedicated per report.     See Function Navigator for Current Functional Status.   Therapy/Group: Individual Therapy  Canary Brim Ivory Broad, PT, DPT  01/03/2016, 12:23 PM

## 2016-01-03 NOTE — Progress Notes (Signed)
Occupational Therapy Session Note  Patient Details  Name: [name] MRN: [id number] Date of Birth: [birth year]  Today's Date: 01/03/2016 OT Individual Time: [id number] OT Individual Time Calculation (min): 60 min    Short Term Goals: Week 2:  OT Short Term Goal 1 (Week 2): Pt will maintain static sitting balance seated EOM with close supervision in prep for functional task OT Short Term Goal 2 (Week 2): Pt will don shirt with mod A with set-up OT Short Term Goal 3 (Week 2): Pt will direct caregiver with bed mobility with 1 VCs in prep for functional rolling.  Skilled Therapeutic Interventions/Progress Updates:    Pt seen for OT session focusing on ADL re-training, functional sitting balance, and core strengthening/ stability. Pt in supine upon arrival with RN present, administering medications. Pt voiced having had laxative with soiled brief. She rolled with max- total A with +2 assist for hygiene and new brief to be donned total A.  Pants and TED hose donned total A in supine. Pt with increased movement in R LE noted today, able to assist with rolling towards L.  She transferred to EOB with max A. Sat on EOB with total A for balance with manual facilitation for upright balance and chest expansion. She donned shirt with +2 assist for someone to help steady in sitting and second person to help with set-up and donning shirt, working on pt moving UEs to facilitate bringing shirt down arms. Grooming completed total A sitting on EOB. Pt requested supine rest break prior to transferring to w/c due to neck/ back pain.  Total +2 to return to supine and and then transferred to w/c via sliding board with +2 assist. Pt left reclined in reclining w/c at end of session, soft call bell in reach. Made NT aware of need for pressure relief.    Therapy Documentation Precautions:  Precautions Precautions: Fall, Cervical Required Braces or Orthoses: Cervical Brace Cervical Brace: Hard collar, At all  times Restrictions Weight Bearing Restrictions: No  See Function Navigator for Current Functional Status.   Therapy/Group: Individual Therapy  Lewis, Rhealyn Cullen C 01/03/2016, 7:06 AM

## 2016-01-03 NOTE — Progress Notes (Signed)
Subjective/Complaints: Up with OT this morning. No new issues. Sleep still hit or miss. Pain tolerable  Review of systems denies any breathing problems no chest pain no shortness of breath, negative for nausea, vomiting, diarrhea  Objective: Vital Signs: Blood pressure 106/56, pulse 68, temperature 97.6 F (36.4 C), temperature source Oral, resp. rate 17, height '5\' 9"'  (1.753 m), weight 61.689 kg (136 lb), last menstrual period 03/31/2004, SpO2 98 %. No results found. Results for orders placed or performed during the hospital encounter of 12/21/15 (from the past 72 hour(s))  Creatinine, serum     Status: None   Collection Time: 01/01/16  4:58 AM  Result Value Ref Range   Creatinine, Ser 0.52 0.44 - 1.00 mg/dL   GFR calc non Af Amer >60 >60 mL/min   GFR calc Af Amer >60 >60 mL/min    Comment: (NOTE) The eGFR has been calculated using the CKD EPI equation. This calculation has not been validated in all clinical situations. eGFR's persistently <60 mL/min signify possible Chronic Kidney Disease.   Basic metabolic panel     Status: Abnormal   Collection Time: 01/01/16  2:48 PM  Result Value Ref Range   Sodium 133 (L) 135 - 145 mmol/L   Potassium 3.4 (L) 3.5 - 5.1 mmol/L   Chloride 100 (L) 101 - 111 mmol/L   CO2 26 22 - 32 mmol/L   Glucose, Bld 147 (H) 65 - 99 mg/dL   BUN 20 6 - 20 mg/dL   Creatinine, Ser 0.64 0.44 - 1.00 mg/dL   Calcium 8.9 8.9 - 10.3 mg/dL   GFR calc non Af Amer >60 >60 mL/min   GFR calc Af Amer >60 >60 mL/min    Comment: (NOTE) The eGFR has been calculated using the CKD EPI equation. This calculation has not been validated in all clinical situations. eGFR's persistently <60 mL/min signify possible Chronic Kidney Disease.    Anion gap 7 5 - 15  CBC     Status: Abnormal   Collection Time: 01/01/16  2:48 PM  Result Value Ref Range   WBC 14.6 (H) 4.0 - 10.5 K/uL   RBC 2.82 (L) 3.87 - 5.11 MIL/uL   Hemoglobin 8.6 (L) 12.0 - 15.0 g/dL   HCT 26.4 (L) 36.0 -  46.0 %   MCV 93.6 78.0 - 100.0 fL   MCH 30.5 26.0 - 34.0 pg   MCHC 32.6 30.0 - 36.0 g/dL   RDW 13.1 11.5 - 15.5 %   Platelets 267 150 - 400 K/uL  Basic metabolic panel     Status: Abnormal   Collection Time: 01/02/16  5:37 AM  Result Value Ref Range   Sodium 133 (L) 135 - 145 mmol/L   Potassium 3.7 3.5 - 5.1 mmol/L   Chloride 98 (L) 101 - 111 mmol/L   CO2 26 22 - 32 mmol/L   Glucose, Bld 93 65 - 99 mg/dL   BUN 14 6 - 20 mg/dL   Creatinine, Ser 0.58 0.44 - 1.00 mg/dL   Calcium 8.9 8.9 - 10.3 mg/dL   GFR calc non Af Amer >60 >60 mL/min   GFR calc Af Amer >60 >60 mL/min    Comment: (NOTE) The eGFR has been calculated using the CKD EPI equation. This calculation has not been validated in all clinical situations. eGFR's persistently <60 mL/min signify possible Chronic Kidney Disease.    Anion gap 9 5 - 15  CBC     Status: Abnormal   Collection Time: 01/03/16  4:30  AM  Result Value Ref Range   WBC 10.5 4.0 - 10.5 K/uL   RBC 2.89 (L) 3.87 - 5.11 MIL/uL   Hemoglobin 8.6 (L) 12.0 - 15.0 g/dL   HCT 27.0 (L) 36.0 - 46.0 %   MCV 93.4 78.0 - 100.0 fL   MCH 29.8 26.0 - 34.0 pg   MCHC 31.9 30.0 - 36.0 g/dL   RDW 13.4 11.5 - 15.5 %   Platelets 318 150 - 400 K/uL     General: No acute distress. Vital signs reviewed. Psych: Mood is up beat. More alert today.  Heart: Regular rate and rhythm no rubs murmurs or extra sounds Lungs: Clear to auscultation, breathing unlabored, no rales or wheezes Abdomen: Positive bowel sounds, soft nontender to palpation, nondistended Skin: No evidence of breakdown, no evidence of rash Neurologic: speech clear  Motor strength:  Right upper extremity: 1+ to 2- delt/bic trace to 1 tricep,wrist, 0 HI Left upper extremity: 1+ bicep,deltoid, trace wrist/tricep, 0 HI Right lower extremity: 2+/5 proximal distal Left lower extremity: 1/5 proximal to distal Sensory exam absent in bilateral lower extremities to light touch Musculoskeletal: No edema. No  tenderness.  Assessment/Plan: 1. 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 Physiatrist is providing close team supervision and 24 hour management of active medical problems listed below. Physiatrist and rehab team continue to assess barriers to discharge/monitor patient progress toward functional and medical goals. FIM: Function - Bathing Bathing activity did not occur:  (Night bath) Position: Other (comment) (Recliner) Body parts bathed by patient: Right arm, Left arm, Chest, Abdomen (Hand over hand assist) Body parts bathed by helper: Front perineal area, Buttocks Bathing not applicable: Right upper leg, Left upper leg, Right lower leg, Left lower leg, Back Assist Level: Touching or steadying assistance(Pt > 75%) (hand over hand assist using bath mit)  Function- Upper Body Dressing/Undressing What is the patient wearing?: Pull over shirt/dress Pull over shirt/dress - Perfomed by patient: Thread/unthread right sleeve, Thread/unthread left sleeve Pull over shirt/dress - Perfomed by helper: Put head through opening, Pull shirt over trunk, Thread/unthread left sleeve, Thread/unthread right sleeve Assist Level: 2 helpers Function - Lower Body Dressing/Undressing What is the patient wearing?: Pants, Non-skid slipper socks, Ted Hose Position: Bed Pants- Performed by helper: Thread/unthread right pants leg, Thread/unthread left pants leg, Pull pants up/down Non-skid slipper socks- Performed by helper: Don/doff right sock, Don/doff left sock TED Hose - Performed by helper: Don/doff right TED hose, Don/doff left TED hose Assist for footwear: Dependant Assist for lower body dressing: 2 Helpers  Function - Toileting Toileting activity did not occur: No continent bowel/bladder event Toileting steps completed by patient: Adjust clothing prior to toileting, Performs  perineal hygiene, Adjust clothing after toileting (per Berkley Harvey, Nt report) Assist level: Two helpers (Per [place], NT report)  Function - Air cabin crew transfer activity did not occur: Safety/medical concerns  Function - Chair/bed transfer Chair/bed transfer method: Lateral scoot Chair/bed transfer assist level: 2 helpers Chair/bed transfer assistive device: Sliding board Chair/bed transfer details: Verbal cues for precautions/safety, Verbal cues for safe use of DME/AE, Manual facilitation for weight shifting, Manual facilitation for placement, Manual facilitation for weight bearing  Function - Locomotion: Wheelchair Will patient use wheelchair at discharge?: Yes Type: Manual Wheelchair activity did not occur: Safety/medical concerns Assist Level: Dependent (Pt equals 0%) Wheel 50 feet with 2 turns activity did not occur: Safety/medical concerns Assist Level: Dependent (Pt  equals 0%) Wheel 150 feet activity did not occur: Safety/medical concerns Assist Level: Dependent (Pt equals 0%) Turns around,maneuvers to table,bed, and toilet,negotiates 3% grade,maneuvers on rugs and over doorsills: No Function - Locomotion: Ambulation Ambulation activity did not occur: Safety/medical concerns Walk 10 feet activity did not occur: Safety/medical concerns Walk 50 feet with 2 turns activity did not occur: Safety/medical concerns Walk 150 feet activity did not occur: Safety/medical concerns Walk 10 feet on uneven surfaces activity did not occur: Safety/medical concerns  Function - Comprehension Comprehension: Auditory Comprehension assist level: Understands complex 90% of the time/cues 10% of the time  Function - Expression Expression: Verbal Expression assist level: Expresses complex 90% of the time/cues < 10% of the time  Function - Social Interaction Social Interaction assist level: Interacts appropriately 90% of the time - Needs monitoring or encouragement for participation or  interaction.  Function - Problem Solving Problem solving assist level: Solves basic 75 - 89% of the time/requires cueing 10 - 24% of the time  Function - Memory Memory assist level: Recognizes or recalls 75 - 89% of the time/requires cueing 10 - 24% of the time Patient normally able to recall (first 3 days only): Current season, Location of own room, Staff names and faces, That he or she is in a hospital  Medical Problem List and Plan: 1.  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 Cervical collar at all times   - Continue CIR.   2.  DVT Prophylaxis/Anticoagulation: dopplers negative  -changed to sq lovenox  -Vascular ultrasound negative for lower extremity DVT on 5/31 3. Pain Management/fibromyalgia/chronic back pain: Chronic prednisone 10 mg twice a day,  Oxycodone and Zanaflex as needed,    -Lyrica decreased to 38m tid due to lethargy  -robaxin stopped   4. Mood: Xanax 0.5 mg twice a day 5. Neuropsych: This patient is capable of making decisions on her own behalf. 6. Skin/Wound Care: Routine skin checks 7. Fluids/Electrolytes/Nutrition: intake suboptimal  -megace for appetite which is picking up  -  Repleted potassium 8. Hypertension. Hydrochlorothiazide 25 mg daily, lisinopril 20 mg daily. Fair control 9. Constipation. Laxative assistance 10. Hyperlipidemia.Lovaza 11. Neurogenic bladder  continue ICP program  -cath volumes 300-600 12. Hypoalbuminemia--added pro-stat 13. Escherichia coli UTI  -restarted abx (keflex now) --continue for 7 days total.  -wbc's down to 10.5 today  LOS (Days) 13 A FACE TO FACE EVALUATION WAS PERFORMED  [name] 01/03/2016, 8:36 AM

## 2016-01-03 NOTE — Progress Notes (Signed)
Speech Language Pathology Daily Session Note  Patient Details  Name: [name] MRN: [id number] Date of Birth: 1945/09/18  Today's Date: 01/03/2016 SLP Individual Time: 1400-1500 SLP Individual Time Calculation (min): 60 min  Short Term Goals: Week 2: SLP Short Term Goal 1 (Week 2): Pt will complete mildly complex reasoning tasks with 90% accuracy and Mod A cues.  SLP Short Term Goal 2 (Week 2): Pt will increase intelligibility at the sentence level for 90% intelligibity and Min A  for use of compensatory strategies.  SLP Short Term Goal 3 (Week 2): Pt will complete money management tasks with 90% accuracy and Mod A cues.  SLP Short Term Goal 4 (Week 2): Pt will complete medication management tasks with 90% accuracy and Mod A cues.  SLP Short Term Goal 5 (Week 2): Pt will consume trials of puree without s/s of aspiration/dysphagia with Min A for use of compensatory strategies.   Skilled Therapeutic Interventions: Pt alert and pleasant. Reports that she attempted to solve math word problems from yesterday's therapy session last night when she wasn't able to sleep. Pt tolerated thins via straw with occasional throat clear, clear vocal quality. Pt managed 5 oz pudding with increased time and multiple swallows/bolus. Functional money math completed with 100% acc mod I for increased time. Min A for working memory- repetition of information required. Functional math word problems required mod A to complete.    Function:  Eating Eating   Modified Consistency Diet: Yes Eating Assist Level: Helper feeds patient     Helper Scoops Food on Utensil: Every scoop Helper Brings Food to Mouth: Every scoop   Cognition Comprehension Comprehension assist level: Understands complex 90% of the time/cues 10% of the time  Expression   Expression assist level: Expresses complex 90% of the time/cues < 10% of the time  Social Interaction Social Interaction assist level: Interacts appropriately 90%  of the time - Needs monitoring or encouragement for participation or interaction.  Problem Solving Problem solving assist level: Solves basic 90% of the time/requires cueing < 10% of the time  Memory Memory assist level: Recognizes or recalls 90% of the time/requires cueing < 10% of the time    Pain Pain Assessment Pain Assessment: No/denies pain  Therapy/Group: Individual Therapy  Vinetta Bergamo 01/03/2016, 3:35 PM

## 2016-01-04 ENCOUNTER — Inpatient Hospital Stay (HOSPITAL_COMMUNITY): Payer: Medicare Other | Admitting: Occupational Therapy

## 2016-01-04 ENCOUNTER — Inpatient Hospital Stay (HOSPITAL_COMMUNITY): Payer: Medicare Other | Admitting: Speech Pathology

## 2016-01-04 ENCOUNTER — Inpatient Hospital Stay (HOSPITAL_COMMUNITY): Payer: Medicare Other

## 2016-01-04 LAB — GLUCOSE, CAPILLARY
Glucose-Capillary: 120 mg/dL — ABNORMAL HIGH (ref 65–99)
Glucose-Capillary: 138 mg/dL — ABNORMAL HIGH (ref 65–99)

## 2016-01-04 MED ORDER — MUSCLE RUB 10-15 % EX CREA
TOPICAL_CREAM | CUTANEOUS | Status: DC | PRN
  Filled 2016-01-04: qty 85

## 2016-01-04 NOTE — Progress Notes (Signed)
Speech Language Pathology Weekly Progress and Session Note  Patient Details  Name: [name] MRN: [id number] Date of Birth: March 17, 1946  Beginning of progress report period: December 28, 2015 End of progress report period: January 04, 2016  Today's Date: 01/04/2016 SLP Individual Time: 1430-1500 SLP Individual Time Calculation (min): 30 min  Short Term Goals: Week 2    New Short Term Goals: Week 3: SLP Short Term Goal 1 (Week 3): Pt will consume trials of puree without s/s of aspiration/dysphagia with Min A for use of compensatory strategies.  SLP Short Term Goal 2 (Week 3): Pt will consume trials of dys 2 without s/s of aspiration/dysphagia with Min A for use of compensatory strategies.  SLP Short Term Goal 3 (Week 3): Pt will complete money management tasks with 90% accuracy and min A cues.  SLP Short Term Goal 4 (Week 3): Pt to demonstrate adequate vocal intensity at the single syllable word level with mod A.   Weekly Progress Updates:     Intensity: Minumum of 1-2 x/day, 30 to 90 minutes Frequency: 3 to 5 out of 7 days Duration/Length of Stay: 14 days Treatment/Interventions: Cognitive remediation/compensation;Dysphagia/aspiration precaution training;Cueing hierarchy;Functional tasks;Therapeutic Activities;Medication managment;Patient/family education;Therapeutic Exercise;Internal/external aids;Multimodal communication approach   Daily Session  Skilled Therapeutic Interventions: Pt seen for cognitive goals. Pt able to demonstrate money (coin) counting with 80% mod I. 100% with min A for sorting coins.      Function:   Eating Eating                 Cognition Comprehension Comprehension assist level: Understands complex 90% of the time/cues 10% of the time  Expression   Expression assist level: Expresses complex 90% of the time/cues < 10% of the time  Social Interaction Social Interaction assist level: Interacts appropriately 90% of the time - Needs monitoring  or encouragement for participation or interaction.  Problem Solving Problem solving assist level: Solves basic 90% of the time/requires cueing < 10% of the time  Memory Memory assist level: Recognizes or recalls 90% of the time/requires cueing < 10% of the time   General    Pain Pain Assessment Pain Assessment: No/denies pain  Therapy/Group: Individual Therapy  Vinetta Bergamo MA, CCC-SLP 01/04/2016, 3:03 PM

## 2016-01-04 NOTE — Progress Notes (Signed)
Physical Therapy Weekly Progress Note  Patient Details  Name: [name] MRN: [id number] Date of Birth: 24-Jun-1946  Beginning of progress report period: December 28, 2015 End of progress report period: January 04, 2016  Today's Date: 01/04/2016 PT Individual Time: 1100-1200 PT Individual Time Calculation (min): 60 min  C/o pain in neck and shoulders - RN aware. Pt requesting to return back to bed and declines leaving room due to pain and fatigue from just finishing with OT. Pt required total +2 for slideboard transfer back to bed and to return to supine with cues for technique and head/hips relationship. Once in supine, performed PROM and AAROM to BUE in all planes and AAROM in BLE in all planes for ROM, flexibility, and functional strengthening. Pt repositioned for comfort with all needs in reach. UE's elevated on pillows and educated on edema, recovery, and endurance.    Patient has met 1 of 3 short term goals. Pt continues to be limited by pain, fatigue, and slow motor recovery still requiring total to +2 assist for all mobility. Pt tolerating sitting up OOB in reclining w/c between therapy sessions. D/c plan is SNF now due to sister unable to provide level of care needed.  Patient continues to demonstrate the following deficits: quadriparesis, decreased strength, decreased balance, decreased activity tolerance, impaired tone, and therefore will continue to benefit from skilled PT intervention to enhance overall performance with activity tolerance, balance, postural control, ability to compensate for deficits, functional use of  right upper extremity, right lower extremity, left upper extremity and left lower extremity and coordination.  Patient not progressing toward long term goals.  See goal revision..  Plan of care revisions: Downgraded again due to SNF placement and continued lack of progress..  PT Short Term Goals Week 2:  PT Short Term Goal 1 (Week 2): Pt will be able to tolerate  OOB x 2 hours at a time in w/c PT Short Term Goal 1 - Progress (Week 2): Met PT Short Term Goal 2 (Week 2): Pt will be able to demonstrate sitting balance with max assist during functional task EOM PT Short Term Goal 2 - Progress (Week 2): Not met PT Short Term Goal 3 (Week 2): Pt will be able to perform transfers with max assist PT Short Term Goal 3 - Progress (Week 2): Not met Week 3:  PT Short Term Goal 1 (Week 3): Pt will be able to demonstrate sitting balance with max assist during functional task EOM PT Short Term Goal 2 (Week 3): Pt will be able to perform basic transfers with max assist  Skilled Therapeutic Interventions/Progress Updates:  Balance/vestibular training;Community reintegration;DME/adaptive equipment instruction;Functional electrical stimulation;Functional mobility training;Patient/family education;Neuromuscular re-education;Pain management;Psychosocial support;UE/LE Strength taining/ROM;Wheelchair propulsion/positioning;Therapeutic Activities;UE/LE Coordination activities;Therapeutic Exercise;Splinting/orthotics;Cognitive remediation/compensation;Discharge planning;Disease management/prevention;Ambulation/gait training;Skin care/wound management   Therapy Documentation Precautions:  Precautions Precautions: Fall, Cervical Required Braces or Orthoses: Cervical Brace Cervical Brace: Hard collar, At all times Restrictions Weight Bearing Restrictions: No  See Function Navigator for Current Functional Status.  Therapy/Group: Individual Therapy  Canary Brim Ivory Broad, PT, DPT  01/04/2016, 12:06 PM

## 2016-01-04 NOTE — Plan of Care (Signed)
Problem: RH Bed Mobility Goal: LTG Patient will perform bed mobility with assist (PT) LTG: Patient will perform bed mobility with assistance, with/without cues (PT).  Downgraded due to lack of progress ABG

## 2016-01-04 NOTE — Progress Notes (Signed)
Speech Language Pathology Daily Session Note  Patient Details  Name: [name] MRN: [id number] Date of Birth: [birth year]  Today's Date: 01/04/2016 SLP Individual Time: 1300-1400 SLP Individual Time Calculation (min): 60 min  Short Term Goals: Week 3: SLP Short Term Goal 1 (Week 3): Pt will consume trials of puree without s/s of aspiration/dysphagia with Min A for use of compensatory strategies.  SLP Short Term Goal 2 (Week 3): Pt will consume trials of dys 2 without s/s of aspiration/dysphagia with Min A for use of compensatory strategies.  SLP Short Term Goal 3 (Week 3): Pt will complete money management tasks with 90% accuracy and min A cues.  SLP Short Term Goal 4 (Week 3): Pt to demonstrate adequate vocal intensity at the single syllable word level with mod A.   Skilled Therapeutic Interventions: Skilled treatment session focused on dysphagia and cognitive goals. SLP facilitated session by providing Min A verbal cues for use of small sips with thin liquids via straw. Patient consumed lunch meal of a full liquid diet with intermittent throat clears, suspect due to large sequential sips. Patient was 100% intelligible at the phrase and sentence level with Mod I. Patient left supine in bed with all needs within reach. Continue with current plan of care.    Function:  Eating Eating   Modified Consistency Diet: Yes Eating Assist Level: Helper feeds patient     Helper Scoops Food on Utensil: Every scoop Helper Brings Food to Mouth: Every scoop   Cognition Comprehension Comprehension assist level: Understands complex 90% of the time/cues 10% of the time  Expression   Expression assist level: Expresses complex 90% of the time/cues < 10% of the time  Social Interaction Social Interaction assist level: Interacts appropriately 90% of the time - Needs monitoring or encouragement for participation or interaction.  Problem Solving Problem solving assist level: Solves basic 90% of  the time/requires cueing < 10% of the time  Memory Memory assist level: Recognizes or recalls 90% of the time/requires cueing < 10% of the time    Pain Pain Score: 9  Pain Location: Neck Pain Descriptors / Indicators: Aching Pain Intervention(s): Medication (See eMAR)  Therapy/Group: Individual Therapy  Lorraina Spring 01/04/2016, 4:22 PM

## 2016-01-04 NOTE — Progress Notes (Signed)
Occupational Therapy Session Note  Patient Details  Name: [name] MRN: [id number] Date of Birth: 04-12-1946  Today's Date: 01/04/2016 OT Individual Time: 1000-1055 OT Individual Time Calculation (min): 55 min    Short Term Goals: Week 2:  OT Short Term Goal 1 (Week 2): Pt will maintain static sitting balance seated EOM with close supervision in prep for functional task OT Short Term Goal 2 (Week 2): Pt will don shirt with mod A with set-up OT Short Term Goal 3 (Week 2): Pt will direct caregiver with bed mobility with 1 VCs in prep for functional rolling.  Skilled Therapeutic Interventions/Progress Updates:    Pt seen for OT session focusing on ADL re-training, functional sitting balance, and positioning. Pt in supine upon arrival, voicing desire to get OOB. TED hose and pants donned total A in supine with +2 to assist with rolling to get pants up. She was able to bend R LE up ~50% of way initiate rolling and able to walk R LE off EOB in prep for transition to EOB. Pt sat EOB with total A to don shirt, working on pt leaning forward to have arm dangle to assist with threading shirt, in all +2 total A required to don shirt for balance and clothing management.  +2 sliding board transfer completed to reclining w/c and in therapy gym from w/c> therapy mat. Pt sat EOM with total A, positioned with back supported on physioball with therapist behind and positioned with arms up and to facilitate chest expansion. She voiced feeling slight stretch with this upright positioning. She began to complain of increased pain in neck, unable to tolerate head in unsupported position. Pt reclined back slightly on mat into therapist with head supported on pillow for rest break. Kinesiotape placed on pt's R trap. For comfort, educated regarding purpose and use of kinesiotape. She declined attempting sitting up on EOM anymore, requesting return to w/c.  Pt returned to room and positioned with pillows and  towels for comfort. Retrograde massage performed to B hands with lotion. Education provided regarding directing caregivers for technique.  Pt left in w/c in prep for PT session. RN made aware of pt's complaints.   Therapy Documentation Precautions:  Precautions Precautions: Fall, Cervical Required Braces or Orthoses: Cervical Brace Cervical Brace: Hard collar, At all times Restrictions Weight Bearing Restrictions: No Pain: Pain Assessment Pain Score: 7  Pain Type: Acute pain Pain Location: Neck Pain Descriptors / Indicators: Aching Pain Intervention(s): RN made aware, repositioned, rest  See Function Navigator for Current Functional Status.   Therapy/Group: Individual Therapy  Lewis, Ithzel Fedorchak C 01/04/2016, 7:10 AM

## 2016-01-04 NOTE — Plan of Care (Signed)
Problem: RH Car Transfers Goal: LTG Patient will perform car transfers with assist (PT) LTG: Patient will perform car transfers with assistance (PT).  D/C due to SNF placement  Problem: RH Wheelchair Mobility Goal: LTG Patient will propel w/c in controlled environment (PT) LTG: Patient will propel wheelchair in controlled environment, # of feet with assist (PT)  D/C  Problem: RH Balance Goal: LTG Patient will maintain dynamic sitting balance (PT) LTG: Patient will maintain dynamic sitting balance with assistance during mobility activities (PT)  Downgraded due to lack of progress ABG

## 2016-01-04 NOTE — Progress Notes (Signed)
Subjective/Complaints: Slept a little better. Neck/shoulders tender.  Review of systems denies any breathing problems no chest pain no shortness of breath, negative for nausea, vomiting, diarrhea  Objective: Vital Signs: Blood pressure 104/63, pulse 70, temperature 98.1 F (36.7 C), temperature source Oral, resp. rate 18, height '5\' 9"'  (1.753 m), weight 61.689 kg (136 lb), last menstrual period 03/31/2004, SpO2 100 %. No results found. Results for orders placed or performed during the hospital encounter of 12/21/15 (from the past 72 hour(s))  Basic metabolic panel     Status: Abnormal   Collection Time: 01/01/16  2:48 PM  Result Value Ref Range   Sodium 133 (L) 135 - 145 mmol/L   Potassium 3.4 (L) 3.5 - 5.1 mmol/L   Chloride 100 (L) 101 - 111 mmol/L   CO2 26 22 - 32 mmol/L   Glucose, Bld 147 (H) 65 - 99 mg/dL   BUN 20 6 - 20 mg/dL   Creatinine, Ser 0.64 0.44 - 1.00 mg/dL   Calcium 8.9 8.9 - 10.3 mg/dL   GFR calc non Af Amer >60 >60 mL/min   GFR calc Af Amer >60 >60 mL/min    Comment: (NOTE) The eGFR has been calculated using the CKD EPI equation. This calculation has not been validated in all clinical situations. eGFR's persistently <60 mL/min signify possible Chronic Kidney Disease.    Anion gap 7 5 - 15  CBC     Status: Abnormal   Collection Time: 01/01/16  2:48 PM  Result Value Ref Range   WBC 14.6 (H) 4.0 - 10.5 K/uL   RBC 2.82 (L) 3.87 - 5.11 MIL/uL   Hemoglobin 8.6 (L) 12.0 - 15.0 g/dL   HCT 26.4 (L) 36.0 - 46.0 %   MCV 93.6 78.0 - 100.0 fL   MCH 30.5 26.0 - 34.0 pg   MCHC 32.6 30.0 - 36.0 g/dL   RDW 13.1 11.5 - 15.5 %   Platelets 267 150 - 400 K/uL  Basic metabolic panel     Status: Abnormal   Collection Time: 01/02/16  5:37 AM  Result Value Ref Range   Sodium 133 (L) 135 - 145 mmol/L   Potassium 3.7 3.5 - 5.1 mmol/L   Chloride 98 (L) 101 - 111 mmol/L   CO2 26 22 - 32 mmol/L   Glucose, Bld 93 65 - 99 mg/dL   BUN 14 6 - 20 mg/dL   Creatinine, Ser 0.58 0.44  - 1.00 mg/dL   Calcium 8.9 8.9 - 10.3 mg/dL   GFR calc non Af Amer >60 >60 mL/min   GFR calc Af Amer >60 >60 mL/min    Comment: (NOTE) The eGFR has been calculated using the CKD EPI equation. This calculation has not been validated in all clinical situations. eGFR's persistently <60 mL/min signify possible Chronic Kidney Disease.    Anion gap 9 5 - 15  CBC     Status: Abnormal   Collection Time: 01/03/16  4:30 AM  Result Value Ref Range   WBC 10.5 4.0 - 10.5 K/uL   RBC 2.89 (L) 3.87 - 5.11 MIL/uL   Hemoglobin 8.6 (L) 12.0 - 15.0 g/dL   HCT 27.0 (L) 36.0 - 46.0 %   MCV 93.4 78.0 - 100.0 fL   MCH 29.8 26.0 - 34.0 pg   MCHC 31.9 30.0 - 36.0 g/dL   RDW 13.4 11.5 - 15.5 %   Platelets 318 150 - 400 K/uL     General: No acute distress. Vital signs reviewed. Psych: Mood  is up beat. More alert today.  Heart: Regular rate and rhythm no rubs murmurs or extra sounds Lungs: Clear to auscultation, breathing unlabored, no rales or wheezes Abdomen: Positive bowel sounds, soft nontender to palpation, nondistended Skin: No evidence of breakdown, no evidence of rash Neurologic: speech clear  Motor strength:  Right upper extremity: 1+ to 2- delt/bic trace to 1 tricep,wrist, 0 HI Left upper extremity: 1+ bicep,deltoid, trace wrist/tricep, 0 HI Right lower extremity: 2+/5 proximal distal Left lower extremity: 1/5 proximal to distal Sensory exam absent in bilateral lower extremities to light touch Musculoskeletal: No edema. No tenderness.  Assessment/Plan: 1. 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 Physiatrist is providing close team supervision and 24 hour management of active medical problems listed below. Physiatrist and rehab team continue to assess barriers to discharge/monitor patient progress toward functional and medical goals. FIM: Function -  Bathing Bathing activity did not occur:  (Night bath) Position: Other (comment) (Recliner) Body parts bathed by patient: Right arm, Left arm, Chest, Abdomen (Hand over hand assist) Body parts bathed by helper: Front perineal area, Buttocks Bathing not applicable: Right upper leg, Left upper leg, Right lower leg, Left lower leg, Back Assist Level: Touching or steadying assistance(Pt > 75%) (hand over hand assist using bath mit)  Function- Upper Body Dressing/Undressing What is the patient wearing?: Pull over shirt/dress Pull over shirt/dress - Perfomed by patient: Thread/unthread right sleeve, Thread/unthread left sleeve Pull over shirt/dress - Perfomed by helper: Put head through opening, Pull shirt over trunk, Thread/unthread left sleeve, Thread/unthread right sleeve Assist Level: 2 helpers Function - Lower Body Dressing/Undressing What is the patient wearing?: Pants, Non-skid slipper socks, Ted Hose Position: Bed Pants- Performed by helper: Thread/unthread right pants leg, Thread/unthread left pants leg, Pull pants up/down Non-skid slipper socks- Performed by helper: Don/doff right sock, Don/doff left sock TED Hose - Performed by helper: Don/doff right TED hose, Don/doff left TED hose Assist for footwear: Dependant Assist for lower body dressing: 2 Helpers  Function - Toileting Toileting activity did not occur: No continent bowel/bladder event Toileting steps completed by patient: Adjust clothing prior to toileting, Performs perineal hygiene, Adjust clothing after toileting (per Berkley Harvey, Nt report) Assist level: Two helpers (Per [place], NT report)  Function - Air cabin crew transfer activity did not occur: Safety/medical concerns  Function - Chair/bed transfer Chair/bed transfer method: Lateral scoot Chair/bed transfer assist level: 2 helpers Chair/bed transfer assistive device: Sliding board Chair/bed transfer details: Verbal cues for precautions/safety, Verbal cues  for safe use of DME/AE, Manual facilitation for weight shifting, Manual facilitation for placement, Manual facilitation for weight bearing  Function - Locomotion: Wheelchair Will patient use wheelchair at discharge?: Yes Type: Manual Wheelchair activity did not occur: Safety/medical concerns Assist Level: Dependent (Pt equals 0%) Wheel 50 feet with 2 turns activity did not occur: Safety/medical concerns Assist Level: Dependent (Pt equals 0%) Wheel 150 feet activity did not occur: Safety/medical concerns Assist Level: Dependent (Pt equals 0%) Turns around,maneuvers to table,bed, and toilet,negotiates 3% grade,maneuvers on rugs and over doorsills: No Function - Locomotion: Ambulation Ambulation activity did not occur: Safety/medical concerns Walk 10 feet activity did not occur: Safety/medical concerns Walk 50 feet with 2 turns activity did not occur: Safety/medical concerns Walk 150 feet activity did not occur: Safety/medical concerns Walk 10 feet on uneven surfaces activity did not occur: Safety/medical concerns  Function - Comprehension Comprehension: Auditory Comprehension assist level: Understands  complex 90% of the time/cues 10% of the time  Function - Expression Expression: Verbal Expression assist level: Expresses complex 90% of the time/cues < 10% of the time  Function - Social Interaction Social Interaction assist level: Interacts appropriately 90% of the time - Needs monitoring or encouragement for participation or interaction.  Function - Problem Solving Problem solving assist level: Solves basic 90% of the time/requires cueing < 10% of the time  Function - Memory Memory assist level: Recognizes or recalls 90% of the time/requires cueing < 10% of the time Patient normally able to recall (first 3 days only): Current season, Location of own room, Staff names and faces, That he or she is in a hospital  Medical Problem List and Plan: 1.  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 Cervical collar at all times   - Continue CIR.   2.  DVT Prophylaxis/Anticoagulation: dopplers negative  -changed to sq lovenox  -Vascular ultrasound negative for lower extremity DVT on 5/31 3. Pain Management/fibromyalgia/chronic back pain: Chronic prednisone 10 mg twice a day,  Oxycodone and Zanaflex as needed,    -Lyrica decreased to 68m tid due to lethargy  -robaxin stopped    -add sports cream for neck/shoulders. 4. Mood: Xanax 0.5 mg twice a day 5. Neuropsych: This patient is capable of making decisions on her own behalf. 6. Skin/Wound Care: Routine skin checks 7. Fluids/Electrolytes/Nutrition: intake suboptimal  -megace for appetite which is picking up  -  Repleted potassium 8. Hypertension. Hydrochlorothiazide 25 mg daily, lisinopril 20 mg daily. Fair control 9. Constipation. Laxative assistance 10. Hyperlipidemia.Lovaza 11. Neurogenic bladder  continue ICP program  -cath volumes 300-600 12. Hypoalbuminemia--added pro-stat 13. Escherichia coli UTI  -restarted abx (keflex now) --continue for 7 days total.  -wbc's down to 10.5 today  LOS (Days) 14 A FACE TO FACE EVALUATION WAS PERFORMED  SWARTZ,ZACHARY T 01/04/2016, 9:04 AM

## 2016-01-05 ENCOUNTER — Inpatient Hospital Stay (HOSPITAL_COMMUNITY): Payer: Medicare Other

## 2016-01-05 NOTE — Progress Notes (Signed)
Occupational Therapy Session Note  Patient Details  Name: [name] MRN: [id number] Date of Birth: Feb 20, 1946  Today's Date: 01/05/2016 OT Individual Time: 1330-1400 OT Individual Time Calculation (min): 30 min    Short Term Goals: Week 2:  OT Short Term Goal 1 (Week 2): Pt will maintain static sitting balance seated EOM with close supervision in prep for functional task OT Short Term Goal 2 (Week 2): Pt will don shirt with mod A with set-up OT Short Term Goal 3 (Week 2): Pt will direct caregiver with bed mobility with 1 VCs in prep for functional rolling.  Skilled Therapeutic Interventions/Progress Updates: Therapeutic exercises with emphasis on BUE strengthening, supported AROM (no weights).    Pt received seated supported by pillows in tilt-in-space w/c and in no distress.   Pt unable to lift or raise either arm when cued independently but agreeable to therex for strengthening.   Pt performed self ROM at each elbow and shoulder, 10 reps each, elbow flexion, elbow extension, shoulder flexion, shoulder horizontal ab/adduction (figure 8's), with min assist to support elbow and wrist as pt completes each exercise.   Pt was encouraged by her performance although unable to elicit active wrist, finger/thumb movement during session.     Therapy Documentation Precautions:  Precautions Precautions: Fall, Cervical Required Braces or Orthoses: Cervical Brace Cervical Brace: Hard collar, At all times Restrictions Weight Bearing Restrictions: No  Vital Signs: Therapy Vitals Temp: 97.4 F (36.3 C) Temp Source: Oral Pulse Rate: 67 Resp: 17 BP: 101/71 mmHg Patient Position (if appropriate): Sitting Oxygen Therapy SpO2: 99 % O2 Device: Not Delivered   Pain: No/denies pain    See Function Navigator for Current Functional Status.   Therapy/Group: Individual Therapy  [place] 01/05/2016, 4:58 PM

## 2016-01-05 NOTE — Progress Notes (Signed)
Subjective/Complaints: Appears to have slept better last night. Pain waxes and wanes.  Review of systems denies any breathing problems no chest pain no shortness of breath, negative for nausea, vomiting, diarrhea  Objective: Vital Signs: Blood pressure 109/62, pulse 65, temperature 97.5 F (36.4 C), temperature source Oral, resp. rate 18, height 5\' 9"  (1.753 m), weight 61.689 kg (136 lb), last menstrual period 03/31/2004, SpO2 99 %. No results found. Results for orders placed or performed during the hospital encounter of 12/21/15 (from the past 72 hour(s))  CBC     Status: Abnormal   Collection Time: 01/03/16  4:30 AM  Result Value Ref Range   WBC 10.5 4.0 - 10.5 K/uL   RBC 2.89 (L) 3.87 - 5.11 MIL/uL   Hemoglobin 8.6 (L) 12.0 - 15.0 g/dL   HCT 27.0 (L) 36.0 - 46.0 %   MCV 93.4 78.0 - 100.0 fL   MCH 29.8 26.0 - 34.0 pg   MCHC 31.9 30.0 - 36.0 g/dL   RDW 13.4 11.5 - 15.5 %   Platelets 318 150 - 400 K/uL  Glucose, capillary     Status: Abnormal   Collection Time: 01/04/16 11:42 AM  Result Value Ref Range   Glucose-Capillary 120 (H) 65 - 99 mg/dL  Glucose, capillary     Status: Abnormal   Collection Time: 01/04/16  4:55 PM  Result Value Ref Range   Glucose-Capillary 138 (H) 65 - 99 mg/dL     General: No acute distress. Vital signs reviewed. Psych: Mood is up beat. More alert today.  Heart: Regular rate and rhythm no rubs murmurs or extra sounds Lungs: Clear to auscultation, breathing unlabored, no rales or wheezes Abdomen: Positive bowel sounds, soft nontender to palpation, nondistended Skin: No evidence of breakdown, no evidence of rash Neurologic: speech clear  Motor strength:  Right upper extremity: 1+ to 2- delt/bic trace to 1 tricep,wrist, 0 HI Left upper extremity: 1+ bicep,deltoid, trace wrist/tricep, 0 HI Right lower extremity: 2+/5 proximal distal Left lower extremity: 1/5 proximal to distal Sensory exam absent in bilateral lower extremities to light  touch Musculoskeletal: No edema. No tenderness.  Assessment/Plan: 1. 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 Physiatrist is providing close team supervision and 24 hour management of active medical problems listed below. Physiatrist and rehab team continue to assess barriers to discharge/monitor patient progress toward functional and medical goals. FIM: Function - Bathing Bathing activity did not occur:  (Night bath) Position: Bed Body parts bathed by patient: Right arm, Left arm, Chest, Abdomen (Hand over hand assist) Body parts bathed by helper: Right arm, Left arm, Chest, Abdomen, Front perineal area, Buttocks, Right upper leg, Left upper leg, Right lower leg, Left lower leg, Back Bathing not applicable: Right upper leg, Left upper leg, Right lower leg, Left lower leg, Back Assist Level: Touching or steadying assistance(Pt > 75%) (hand over hand assist using bath mit)  Function- Upper Body Dressing/Undressing What is the patient wearing?: Hospital gown Pull over shirt/dress - Perfomed by patient: Thread/unthread right sleeve, Thread/unthread left sleeve Pull over shirt/dress - Perfomed by helper: Put head through opening, Pull shirt over trunk, Thread/unthread left sleeve, Thread/unthread right sleeve Assist Level: 2 helpers Function - Lower Body Dressing/Undressing What is the patient wearing?: Hospital Gown Position: Bed Pants- Performed by helper: Thread/unthread right pants leg, Thread/unthread left pants leg, Pull pants up/down Non-skid slipper socks- Performed by helper: Don/doff right sock,  Don/doff left sock TED Hose - Performed by helper: Don/doff right TED hose, Don/doff left TED hose Assist for footwear: Dependant Assist for lower body dressing: 2 Helpers  Function - Toileting Toileting activity did not occur: No continent bowel/bladder  event Toileting steps completed by patient: Adjust clothing prior to toileting, Performs perineal hygiene, Adjust clothing after toileting (per Berkley Harvey, Nt report) Assist level: Two helpers (Per [place], NT report)  Function - Air cabin crew transfer activity did not occur: Safety/medical concerns  Function - Chair/bed transfer Chair/bed transfer method: Lateral scoot Chair/bed transfer assist level: 2 helpers Chair/bed transfer assistive device: Sliding board Chair/bed transfer details: Verbal cues for precautions/safety, Verbal cues for safe use of DME/AE, Manual facilitation for weight shifting, Manual facilitation for placement, Manual facilitation for weight bearing  Function - Locomotion: Wheelchair Will patient use wheelchair at discharge?: Yes Type: Manual Wheelchair activity did not occur: Safety/medical concerns Assist Level: Dependent (Pt equals 0%) Wheel 50 feet with 2 turns activity did not occur: Safety/medical concerns Assist Level: Dependent (Pt equals 0%) Wheel 150 feet activity did not occur: Safety/medical concerns Assist Level: Dependent (Pt equals 0%) Turns around,maneuvers to table,bed, and toilet,negotiates 3% grade,maneuvers on rugs and over doorsills: No Function - Locomotion: Ambulation Ambulation activity did not occur: Safety/medical concerns Walk 10 feet activity did not occur: Safety/medical concerns Walk 50 feet with 2 turns activity did not occur: Safety/medical concerns Walk 150 feet activity did not occur: Safety/medical concerns Walk 10 feet on uneven surfaces activity did not occur: Safety/medical concerns  Function - Comprehension Comprehension: Auditory Comprehension assist level: Follows complex conversation/direction with extra time/assistive device  Function - Expression Expression: Verbal Expression assist level: Expresses complex 90% of the time/cues < 10% of the time  Function - Social Interaction Social Interaction  assist level: Interacts appropriately 90% of the time - Needs monitoring or encouragement for participation or interaction.  Function - Problem Solving Problem solving assist level: Solves basic 90% of the time/requires cueing < 10% of the time  Function - Memory Memory assist level: Recognizes or recalls 90% of the time/requires cueing < 10% of the time Patient normally able to recall (first 3 days only): Current season, Location of own room, Staff names and faces, That he or she is in a hospital  Medical Problem List and Plan: 1.  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 Cervical collar at all times   - Continue CIR.    -overall much more alert with changes below 2.  DVT Prophylaxis/Anticoagulation: dopplers negative  -changed to sq lovenox  -Vascular ultrasound negative for lower extremity DVT on 5/31 3. Pain Management/fibromyalgia/chronic back pain: Chronic prednisone 10 mg twice a day,  Oxycodone and Zanaflex as needed,    -Lyrica decreased to 25mg  tid due to lethargy  -robaxin stopped    -add sports cream for neck/shoulders. 4. Mood: Xanax 0.5 mg twice a day 5. Neuropsych: This patient is capable of making decisions on her own behalf. 6. Skin/Wound Care: Routine skin checks 7. Fluids/Electrolytes/Nutrition: intake suboptimal  -megace for appetite which is picking up  -  Repleted potassium 8. Hypertension. Hydrochlorothiazide 25 mg daily, lisinopril 20 mg daily. Fair control 9. Constipation. Laxative assistance 10. Hyperlipidemia.Lovaza 11. Neurogenic bladder  continue ICP program  -cath volumes 300-600 generally 12. Hypoalbuminemia--added pro-stat 13. Escherichia coli UTI  -keflex for 7 days total.  -wbc's down to 10.5 today  LOS (Days) 15 A FACE TO FACE EVALUATION  WAS PERFORMED  SWARTZ,ZACHARY T 01/05/2016, 9:11 AM

## 2016-01-06 ENCOUNTER — Inpatient Hospital Stay (HOSPITAL_COMMUNITY): Payer: Medicare Other | Admitting: *Deleted

## 2016-01-06 MED ORDER — MUSCLE RUB 10-15 % EX CREA
TOPICAL_CREAM | Freq: Three times a day (TID) | CUTANEOUS | Status: DC
  Administered 2016-01-06 – 2016-01-08 (×8): via TOPICAL
  Administered 2016-01-08: 1 via TOPICAL
  Administered 2016-01-09 – 2016-01-11 (×5): via TOPICAL
  Filled 2016-01-06: qty 85

## 2016-01-06 NOTE — Progress Notes (Signed)
Physical Therapy Session Note  Patient Details  Name: [name] MRN: [id number] Date of Birth: 27-Mar-1946  Today's Date: 01/06/2016 PT Individual Time: 1430-1500 PT Individual Time Calculation (min): 30 min     Skilled Therapeutic Interventions/Progress Updates:  Patient in w/c ,asked to be put in bed ,as she is very tired. With the assistance of therapy tech, transferred patient to bed via mechanical lift and repositioned in order to assure safety.  PROM and AAROM exercises to B LE and UE in all planes of motion and in PNF patterns performed in supine.  At the end of session call button placed where patient can reach, x3 trials  to assure ease and ability to use.   Therapy Documentation Precautions:  Precautions Precautions: Fall, Cervical Required Braces or Orthoses: Cervical Brace Cervical Brace: Hard collar, At all times Restrictions Weight Bearing Restrictions: No   See Function Navigator for Current Functional Status.   Therapy/Group: Individual Therapy  [name] 01/06/2016, 3:51 PM

## 2016-01-06 NOTE — Progress Notes (Signed)
Subjective/Complaints: Still having neck and shoulder pain---hurts when therapist "pushes on shoulders for positioning"  Review of systems denies any breathing problems no chest pain no shortness of breath, negative for nausea, vomiting, diarrhea  Objective: Vital Signs: Blood pressure 137/68, pulse 75, temperature 98.1 F (36.7 C), temperature source Oral, resp. rate 18, height 5\' 9"  (1.753 m), weight 61.689 kg (136 lb), last menstrual period 03/31/2004, SpO2 99 %. No results found. Results for orders placed or performed during the hospital encounter of 12/21/15 (from the past 72 hour(s))  Glucose, capillary     Status: Abnormal   Collection Time: 01/04/16 11:42 AM  Result Value Ref Range   Glucose-Capillary 120 (H) 65 - 99 mg/dL  Glucose, capillary     Status: Abnormal   Collection Time: 01/04/16  4:55 PM  Result Value Ref Range   Glucose-Capillary 138 (H) 65 - 99 mg/dL     General: No acute distress. Vital signs reviewed. Psych: Mood is up beat. More alert today.  Heart: Regular rate and rhythm no rubs murmurs or extra sounds Lungs: Clear to auscultation, breathing unlabored, no rales or wheezes Abdomen: Positive bowel sounds, soft nontender to palpation, nondistended Skin: No evidence of breakdown, no evidence of rash Neurologic: speech clear. Very alert Motor strength:  Right upper extremity: 1+ to 2- delt/bic trace to 1 tricep,wrist, 0 HI Left upper extremity: 1+ bicep,deltoid, trace wrist/tricep, 0 HI Right lower extremity: 2+/5 proximal distal Left lower extremity: 1/5 proximal to distal Sensory exam absent in bilateral lower extremities to light touch Musculoskeletal: No edema. No tenderness.  Assessment/Plan: 1. 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 Physiatrist is providing close team supervision and 24 hour management of active  medical problems listed below. Physiatrist and rehab team continue to assess barriers to discharge/monitor patient progress toward functional and medical goals. FIM: Function - Bathing Bathing activity did not occur:  (Night bath) Position: Bed Body parts bathed by patient: Right arm, Left arm, Chest, Abdomen (Hand over hand assist) Body parts bathed by helper: Right arm, Left arm, Chest, Abdomen, Front perineal area, Buttocks, Right upper leg, Left upper leg, Right lower leg, Left lower leg, Back Bathing not applicable: Right upper leg, Left upper leg, Right lower leg, Left lower leg, Back Assist Level: Touching or steadying assistance(Pt > 75%) (hand over hand assist using bath mit)  Function- Upper Body Dressing/Undressing What is the patient wearing?: Hospital gown Pull over shirt/dress - Perfomed by patient: Thread/unthread right sleeve, Thread/unthread left sleeve Pull over shirt/dress - Perfomed by helper: Put head through opening, Pull shirt over trunk, Thread/unthread left sleeve, Thread/unthread right sleeve Assist Level: 2 helpers Function - Lower Body Dressing/Undressing What is the patient wearing?: Hospital Gown Position: Bed Pants- Performed by helper: Thread/unthread right pants leg, Thread/unthread left pants leg, Pull pants up/down Non-skid slipper socks- Performed by helper: Don/doff right sock, Don/doff left sock TED Hose - Performed by helper: Don/doff right TED hose, Don/doff left TED hose Assist for footwear: Dependant Assist for lower body dressing: 2 Helpers  Function - Toileting Toileting activity did not occur: No continent bowel/bladder event Toileting steps completed by patient: Adjust clothing prior to toileting, Performs perineal hygiene, Adjust clothing after toileting (per Berkley Harvey, Nt report) Assist level: Two helpers (Per [place], NT report)  Function - Air cabin crew transfer activity did not occur: Safety/medical concerns  Function -  Chair/bed transfer Chair/bed transfer method:  Lateral scoot Chair/bed transfer assist level: 2 helpers Chair/bed transfer assistive device: Sliding board Chair/bed transfer details: Verbal cues for precautions/safety, Verbal cues for safe use of DME/AE, Manual facilitation for weight shifting, Manual facilitation for placement, Manual facilitation for weight bearing  Function - Locomotion: Wheelchair Will patient use wheelchair at discharge?: Yes Type: Manual Wheelchair activity did not occur: Safety/medical concerns Assist Level: Dependent (Pt equals 0%) Wheel 50 feet with 2 turns activity did not occur: Safety/medical concerns Assist Level: Dependent (Pt equals 0%) Wheel 150 feet activity did not occur: Safety/medical concerns Assist Level: Dependent (Pt equals 0%) Turns around,maneuvers to table,bed, and toilet,negotiates 3% grade,maneuvers on rugs and over doorsills: No Function - Locomotion: Ambulation Ambulation activity did not occur: Safety/medical concerns Walk 10 feet activity did not occur: Safety/medical concerns Walk 50 feet with 2 turns activity did not occur: Safety/medical concerns Walk 150 feet activity did not occur: Safety/medical concerns Walk 10 feet on uneven surfaces activity did not occur: Safety/medical concerns  Function - Comprehension Comprehension: Auditory Comprehension assist level: Follows complex conversation/direction with extra time/assistive device  Function - Expression Expression: Verbal Expression assist level: Expresses complex 90% of the time/cues < 10% of the time  Function - Social Interaction Social Interaction assist level: Interacts appropriately 90% of the time - Needs monitoring or encouragement for participation or interaction.  Function - Problem Solving Problem solving assist level: Solves basic 90% of the time/requires cueing < 10% of the time  Function - Memory Memory assist level: Recognizes or recalls 90% of the  time/requires cueing < 10% of the time Patient normally able to recall (first 3 days only): Current season, Location of own room, Staff names and faces, That he or she is in a hospital  Medical Problem List and Plan: 1.  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 Cervical collar at all times   - Continue CIR.    -  2.  DVT Prophylaxis/Anticoagulation: dopplers negative  -changed to sq lovenox  -Vascular ultrasound negative for lower extremity DVT on 5/31 3. Pain Management/fibromyalgia/chronic back pain: Chronic prednisone 10 mg twice a day,  Oxycodone and Zanaflex as needed,    -Lyrica decreased to 25mg  tid due to lethargy  -robaxin stopped    -added sports cream for neck/shoulders---will schedule. 4. Mood: Xanax 0.5 mg twice a day 5. Neuropsych: This patient is capable of making decisions on her own behalf. 6. Skin/Wound Care: Routine skin checks 7. Fluids/Electrolytes/Nutrition: intake suboptimal  -megace for appetite which is picking up  -  Repleted potassium 8. Hypertension. Hydrochlorothiazide 25 mg daily, lisinopril 20 mg daily. Fair control 9. Constipation. Laxative assistance 10. Hyperlipidemia.Lovaza 11. Neurogenic bladder  continue ICP program  -cath volumes 300-600 generally 12. Hypoalbuminemia--added pro-stat 13. Escherichia coli UTI  -keflex for 7 days total.  -wbc's down to 10.5 today  LOS (Days) 16 A FACE TO FACE EVALUATION WAS PERFORMED  Izic Stfort T 01/06/2016, 9:02 AM

## 2016-01-07 ENCOUNTER — Inpatient Hospital Stay (HOSPITAL_COMMUNITY): Payer: Medicare Other | Admitting: Physical Therapy

## 2016-01-07 ENCOUNTER — Inpatient Hospital Stay (HOSPITAL_COMMUNITY): Payer: Medicare Other | Admitting: Occupational Therapy

## 2016-01-07 ENCOUNTER — Inpatient Hospital Stay (HOSPITAL_COMMUNITY): Payer: Medicare Other | Admitting: Speech Pathology

## 2016-01-07 MED ORDER — ACETAMINOPHEN 500 MG PO TABS
500.0000 mg | ORAL_TABLET | Freq: Four times a day (QID) | ORAL | Status: DC
  Administered 2016-01-07 – 2016-01-12 (×14): 500 mg via ORAL
  Filled 2016-01-07 (×17): qty 1

## 2016-01-07 NOTE — Progress Notes (Signed)
Speech Language Pathology Daily Session Note  Patient Details  Name: [name] MRN: [id number] Date of Birth: 1946/01/18  Today's Date: 01/07/2016 SLP Individual Time: 1000-1100 SLP Individual Time Calculation (min): 60 min  Short Term Goals: Week 3: SLP Short Term Goal 1 (Week 3): Pt will consume trials of puree without s/s of aspiration/dysphagia with Min A for use of compensatory strategies.  SLP Short Term Goal 2 (Week 3): Pt will consume trials of dys 2 without s/s of aspiration/dysphagia with Min A for use of compensatory strategies.  SLP Short Term Goal 3 (Week 3): Pt will complete money management tasks with 90% accuracy and min A cues.  SLP Short Term Goal 4 (Week 3): Pt to demonstrate adequate vocal intensity at the single syllable word level with mod A.   Skilled Therapeutic Interventions: Skilled treatment session focused on addressing dysphagia and cognition goals. SLP facilitated session by providing Total assist for trials of pureed textures with thin liquids and despite alternating bites and sips and allowing time for multiple swallows patient reports globus sensation after about 5 bites which resulted in throat clears and decline of further trials.  Recommend an objective assessment to differentiate suspected impact of pharyngeal and/or esophageal dysphagia.  SLP also facilitated session with Mod assist question cues to recall current medications and functions; RN present to administer during task.   Function:  Eating Eating   Modified Consistency Diet: Yes Eating Assist Level: Helper feeds patient           Cognition Comprehension Comprehension assist level: Follows complex conversation/direction with no assist  Expression   Expression assist level: Expresses complex 90% of the time/cues < 10% of the time  Social Interaction Social Interaction assist level: Interacts appropriately 90% of the time - Needs monitoring or encouragement for participation or  interaction.  Problem Solving Problem solving assist level: Solves basic 90% of the time/requires cueing < 10% of the time  Memory Memory assist level: Recognizes or recalls 75 - 89% of the time/requires cueing 10 - 24% of the time    Pain Pain Assessment Pain Assessment: 0-10 Pain Score: 7  Pain Location: Neck Pain Orientation: Right;Left Pain Radiating Towards: shoulders Pain Onset: On-going Patients Stated Pain Goal: 7 Pain Intervention(s): Medication (See eMAR);Repositioned  Therapy/Group: Individual Therapy  Carmelia Roller., CCC-SLP [id number]  [place] 01/07/2016, 7:25 PM

## 2016-01-07 NOTE — Plan of Care (Signed)
Problem: SCI BOWEL ELIMINATION Goal: RH STG MANAGE BOWEL WITH ASSISTANCE STG Manage Bowel with Total Assistance.  Outcome: Not Progressing No bowel movement. Refused suppository. Goal: RH STG SCI MANAGE BOWEL WITH MEDICATION WITH ASSISTANCE STG SCI Manage bowel with medication with total assistance.  Outcome: Progressing Takes sennokot.

## 2016-01-07 NOTE — Plan of Care (Signed)
Problem: SCI BOWEL ELIMINATION Goal: RH STG SCI MANAGE BOWEL WITH MEDICATION WITH ASSISTANCE STG SCI Manage bowel with medication with total assistance.  Outcome: Not Progressing Patient refusing Senokot   Problem: SCI BLADDER ELIMINATION Goal: RH STG MANAGE BLADDER WITH ASSISTANCE STG Manage Bladder With Total Assistance  Outcome: Progressing Continues to require I/O cath due to retention

## 2016-01-07 NOTE — Progress Notes (Signed)
Subjective/Complaints: Still having headaches.   Review of systems denies any breathing problems no chest pain no shortness of breath, negative for nausea, vomiting, diarrhea  Objective: Vital Signs: Blood pressure 129/65, pulse 70, temperature 98.8 F (37.1 C), temperature source Oral, resp. rate 20, height 5\' 9"  (0000000 m), weight 61.689 kg (136 lb), last menstrual period 03/31/2004, SpO2 99 %. No results found. Results for orders placed or performed during the hospital encounter of 12/21/15 (from the past 72 hour(s))  Glucose, capillary     Status: Abnormal   Collection Time: 01/04/16 11:42 AM  Result Value Ref Range   Glucose-Capillary 120 (H) 65 - 99 mg/dL  Glucose, capillary     Status: Abnormal   Collection Time: 01/04/16  4:55 PM  Result Value Ref Range   Glucose-Capillary 138 (H) 65 - 99 mg/dL     General: No acute distress. Vital signs reviewed. Psych: Mood is up beat. More alert today.  Heart: Regular rate and rhythm no rubs murmurs or extra sounds Lungs: Clear to auscultation, breathing unlabored, no rales or wheezes Abdomen: Positive bowel sounds, soft nontender to palpation, nondistended Skin: No evidence of breakdown, no evidence of rash Neurologic: speech clear. Very alert Motor strength:  Right upper extremity: 1+ to 2- delt/bic trace to 1 tricep,wrist, 0 HI Left upper extremity: 1+ bicep,deltoid, trace wrist/tricep, 0 HI Right lower extremity: 2+/5 proximal distal Left lower extremity: 1/5 proximal to distal Sensory exam absent in bilateral lower extremities to light touch Musculoskeletal: No edema. No tenderness.  Assessment/Plan: 1. 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 Physiatrist is providing close team supervision and 24 hour management of active medical problems listed below. Physiatrist and rehab team continue to  assess barriers to discharge/monitor patient progress toward functional and medical goals. FIM: Function - Bathing Bathing activity did not occur:  (Night bath) Position: Bed Body parts bathed by patient: Right arm, Left arm, Chest, Abdomen (Hand over hand assist) Body parts bathed by helper: Right arm, Left arm, Chest, Abdomen, Front perineal area, Buttocks, Right upper leg, Left upper leg, Right lower leg, Left lower leg, Back Bathing not applicable: Right upper leg, Left upper leg, Right lower leg, Left lower leg, Back Assist Level: Touching or steadying assistance(Pt > 75%) (hand over hand assist using bath mit)  Function- Upper Body Dressing/Undressing What is the patient wearing?: Hospital gown Pull over shirt/dress - Perfomed by patient: Thread/unthread right sleeve, Thread/unthread left sleeve Pull over shirt/dress - Perfomed by helper: Put head through opening, Pull shirt over trunk, Thread/unthread left sleeve, Thread/unthread right sleeve Assist Level: 2 helpers Function - Lower Body Dressing/Undressing What is the patient wearing?: Hospital Gown Position: Bed Pants- Performed by helper: Thread/unthread right pants leg, Thread/unthread left pants leg, Pull pants up/down Non-skid slipper socks- Performed by helper: Don/doff right sock, Don/doff left sock TED Hose - Performed by helper: Don/doff right TED hose, Don/doff left TED hose Assist for footwear: Dependant Assist for lower body dressing: 2 Helpers  Function - Toileting Toileting activity did not occur: No continent bowel/bladder event Toileting steps completed by patient: Adjust clothing prior to toileting, Performs perineal hygiene, Adjust clothing after toileting (per Berkley Harvey, Nt report) Assist level: Two helpers (Per [place], NT report)  Function - Air cabin crew transfer activity did not occur: Safety/medical concerns  Function - Chair/bed transfer Chair/bed transfer method: Lateral scoot Chair/bed  transfer assist level: 2 helpers Chair/bed  transfer assistive device: Sliding board Chair/bed transfer details: Verbal cues for precautions/safety, Verbal cues for safe use of DME/AE, Manual facilitation for weight shifting, Manual facilitation for placement, Manual facilitation for weight bearing  Function - Locomotion: Wheelchair Will patient use wheelchair at discharge?: Yes Type: Manual Wheelchair activity did not occur: Safety/medical concerns Assist Level: Dependent (Pt equals 0%) Wheel 50 feet with 2 turns activity did not occur: Safety/medical concerns Assist Level: Dependent (Pt equals 0%) Wheel 150 feet activity did not occur: Safety/medical concerns Assist Level: Dependent (Pt equals 0%) Turns around,maneuvers to table,bed, and toilet,negotiates 3% grade,maneuvers on rugs and over doorsills: No Function - Locomotion: Ambulation Ambulation activity did not occur: Safety/medical concerns Walk 10 feet activity did not occur: Safety/medical concerns Walk 50 feet with 2 turns activity did not occur: Safety/medical concerns Walk 150 feet activity did not occur: Safety/medical concerns Walk 10 feet on uneven surfaces activity did not occur: Safety/medical concerns  Function - Comprehension Comprehension: Auditory Comprehension assist level: Follows complex conversation/direction with extra time/assistive device  Function - Expression Expression: Verbal Expression assist level: Expresses complex 90% of the time/cues < 10% of the time  Function - Social Interaction Social Interaction assist level: Interacts appropriately 90% of the time - Needs monitoring or encouragement for participation or interaction.  Function - Problem Solving Problem solving assist level: Solves basic 90% of the time/requires cueing < 10% of the time  Function - Memory Memory assist level: Recognizes or recalls 90% of the time/requires cueing < 10% of the time Patient normally able to recall (first 3  days only): Current season, Location of own room, Staff names and faces, That he or she is in a hospital  Medical Problem List and Plan: 1.  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 Cervical collar at all times   - Continue CIR.   2.  DVT Prophylaxis/Anticoagulation: dopplers negative  -changed to sq lovenox  -Vascular ultrasound negative for lower extremity DVT on 5/31 3. Pain Management/fibromyalgia/chronic back pain: Chronic prednisone 10 mg twice a day,  Oxycodone and Zanaflex as needed,    -Lyrica decreased to 25mg  tid due to lethargy  -robaxin stopped    -added sports cream  scheduled. 4. Mood: Xanax 0.5 mg twice a day 5. Neuropsych: This patient is capable of making decisions on her own behalf. 6. Skin/Wound Care: Routine skin checks 7. Fluids/Electrolytes/Nutrition: intake suboptimal  -megace for appetite which is picking up  -  Repleted potassium 8. Hypertension. Hydrochlorothiazide 25 mg daily, lisinopril 20 mg daily. Fair control 9. Constipation. Laxative assistance 10. Hyperlipidemia.Lovaza 11. Neurogenic bladder  continue ICP program  -cath volumes 300-600 generally 12. Hypoalbuminemia--added pro-stat 13. Escherichia coli UTI  -keflex for 7 days total.  -wbc's down  LOS (Days) 17 A FACE TO FACE EVALUATION WAS PERFORMED  SWARTZ,ZACHARY T 01/07/2016, 8:46 AM

## 2016-01-07 NOTE — Progress Notes (Signed)
Occupational Therapy Weekly Progress Note  Patient Details  Name: [name] MRN: [id number] Date of Birth: 1945-08-04  Beginning of progress report period: December 30, 2015 End of progress report period: January 07, 2016  Today's Date: 01/07/2016 OT Individual Time: 0845-1000 OT Individual Time Calculation (min): 75 min    Patient has met 1 of 3 short term goals.  Pt cont to make slow progress towards OT goals. She is very limited by neck/ back pain during sessions unable to tolerate head/ neck in unsupported position for > 1-2 minutes. She currently requires max- total A for static sitting balance. Total A of 1-2 people required for UB dressing.  Patient continues to demonstrate the following deficits: acute pain and quadriparesis at level C4 - C6 and therefore will continue to benefit from skilled OT intervention to enhance overall performance with BADL and Reduce care partner burden.  Patient not progressing toward long term goals.  See goal revision..  Plan of care revisions: Goals have been downgraded to max- total A overall with pt directing care. .  OT Short Term Goals Week 2:  OT Short Term Goal 1 (Week 2): Pt will maintain static sitting balance seated EOM with close supervision in prep for functional task OT Short Term Goal 1 - Progress (Week 2): Not met OT Short Term Goal 2 (Week 2): Pt will don shirt with mod A with set-up OT Short Term Goal 2 - Progress (Week 2): Not met OT Short Term Goal 3 (Week 2): Pt will direct caregiver with bed mobility with 1 VCs in prep for functional rolling. OT Short Term Goal 3 - Progress (Week 2): Met Week 3:  OT Short Term Goal 1 (Week 3): Pt will complete oral care with min A using universal cuff  OT Short Term Goal 2 (Week 3): Pt wil direct caregiver with upper body dressing with Mod I  OT Short Term Goal 3 (Week 3): Pt will tolerate sitting EOB/EOM for 5 minutes to increase functional activity tolerance  Skilled Therapeutic  Interventions/Progress Updates:    Pt seen for OT session focusing on ADL re-training and neuro re-ed. Pt in supine upon arrival voicing "100/10 pain", however pt admits to this being her baseline and that medication already provided. Pt motivated to get OOB. Pants donned total A in supine, able to assist by raising R LE to assist with threading. She rolled with max- total A to have pants pulled up.  She transferred to EOB with total A, RX Muscle Rub donned by therapist for comfort per pt request. +2 sliding board transfer completed to w/c.  Pt noted to have increased elbow flexion/ extension. Trialed pt self-feeding with R hand with dorsal wrist support donned. Pt very unmotivated to eat, however, willing to attempt. With significantly increased time and effort pt able to bring spoon to mouth with elbow supported. At this time, self-feeding not functional due to time and effort required.  In therapy day room, attempted towel pushes at table with +2 stabilizing assist and assist for weight shift. Pt voiced significantly increased pain in neck and back with this exercise. Exercise stopped and rest break provided.  Completed hamstring curls x10 on each side with pt having foot on basketball. With R foot pt able to flex and extend through full ROM without assist, requiring min A for stability on L. Pt returned to room at end of session, left with all needs in reach in prep for SLP session.   Therapy Documentation Precautions:  Precautions Precautions: Fall, Cervical Required Braces or Orthoses: Cervical Brace Cervical Brace: Hard collar, At all times Restrictions Weight Bearing Restrictions: No  See Function Navigator for Current Functional Status.   Therapy/Group: Individual Therapy  Lewis, Trayvond Viets C 01/07/2016, 7:06 AM

## 2016-01-07 NOTE — Progress Notes (Signed)
Physical Therapy Session Note  Patient Details  Name: [name] MRN: [id number] Date of Birth: Aug 07, 1945  Today's Date: 01/07/2016 PT Individual Time: 1300-1357 PT Individual Time Calculation (min): 57 min   Short Term Goals: Week 3:  PT Short Term Goal 1 (Week 3): Pt will be able to demonstrate sitting balance with max assist during functional task EOM PT Short Term Goal 2 (Week 3): Pt will be able to perform basic transfers with max assist  Skilled Therapeutic Interventions/Progress Updates:   Pt received in w/c, no reports of pain at rest.  Performed w/c mobility x 25' with +2 hand over hand assistance to guide hand placement and for shoulder support but pt performing shoulder and elbow flexion<>extension to propel, fatigued quickly.  Pt set up in standing frame and transitioned to standing with total A +2; pt unable to tolerate standing due to pain in shoulders and neck and began to c/o feeling nauseous and decreased responsiveness.  Returned to sitting until pain subsided.   Pt repositioned in chair and participated in [place] for activation/use of bilat UE with bilat shoulder horizontal ADD to squeeze ball, maintain UE contact on ball while lifting from table, ball squeeze with placing ball forwards and back with shoulder/elbow flexion <> extension and ball squeeze with lifting ball to nose and forehead.  At end of session pt performed transfer back to bed with slideboard and feet supported with max-total A; +2 for sit > supine and mod A to roll to position pad.  Pt left with UE supported on pillows and all items within reach.     Therapy Documentation Precautions:  Precautions Precautions: Fall, Cervical Required Braces or Orthoses: Cervical Brace Cervical Brace: Hard collar, At all times Restrictions Weight Bearing Restrictions: No Pain: Pain Assessment Pain Assessment: 0-10 Pain Score: 6  Pain Location: Back Pain Orientation: Left Pain Radiating Towards:  shoulder Pain Onset: On-going Patients Stated Pain Goal: 6 Pain Intervention(s): Repositioned;Medication (See eMAR)   See Function Navigator for Current Functional Status.   Therapy/Group: Individual Therapy  Raylene Everts [facility] 01/07/2016, 1:59 PM

## 2016-01-08 ENCOUNTER — Inpatient Hospital Stay (HOSPITAL_COMMUNITY): Payer: Medicare Other | Admitting: Speech Pathology

## 2016-01-08 ENCOUNTER — Inpatient Hospital Stay (HOSPITAL_COMMUNITY): Payer: Medicare Other

## 2016-01-08 ENCOUNTER — Inpatient Hospital Stay (HOSPITAL_COMMUNITY): Payer: Medicare Other | Admitting: Occupational Therapy

## 2016-01-08 NOTE — Progress Notes (Signed)
Occupational Therapy Session Note  Patient Details  Name: [name] MRN: [id number] Date of Birth: [birth year]  Today's Date: 01/08/2016 OT Individual Time: 1305-1400 OT Individual Time Calculation (min): 55 min   Skilled Therapeutic Interventions/Progress Updates:    1:1 Therapeutic activity.  Transferred pt from w/c to tilt table via maxi move to focus on opportunity for weight bearing through bilateral UE, tolerance to upright positioning, LE strengthening etc. Pt BP did drop with position changes. BP laying flat 90/53; progressed to 30 degrees and BP dropped to 70/48.  Decreased to 20 degrees and BP at 98/53 however difficulty maintaining this BP dropping to 78/49.  Returned to 10 degrees and pt stabilized at 102/68.  On the tilt table, addressed LE mobility using maxi slides to decrease friction including knee flexion/ extension and hip adduction/ abduction. Pt able to perform  AROM with min A for stabilization for right Le but max A for controlled movement and initiation for left LE. REturn to w/c via lift.  Slide board transfer total A back to bed with max A for positioning.   Therapy Documentation Precautions:  Precautions Precautions: Fall, Cervical Required Braces or Orthoses: Cervical Brace Cervical Brace: Hard collar, At all times Restrictions Weight Bearing Restrictions: No Pain:  soreness in neck and shoulders- adjusted positioning for relief and rest breaks as needed. Returned to bed to rest after session  See Function Navigator for Current Functional Status.   Therapy/Group: Individual Therapy  Willeen Cass [facility] 01/08/2016, 9:51 PM

## 2016-01-08 NOTE — Progress Notes (Signed)
Speech-Language Pathology Note  MBSS complete. Full report located under chart review in imaging section.  Gunnar Fusi, M.A., CCC-SLP [phone]

## 2016-01-08 NOTE — Progress Notes (Signed)
Nutrition Follow-up  DOCUMENTATION CODES:   Non-severe (moderate) malnutrition in context of chronic illness  INTERVENTION:  Continue Boost Breeze po TID, each supplement provides 250 kcal and 9 grams of protein.  Encourage adequate PO intake.   NUTRITION DIAGNOSIS:   Malnutrition related to chronic illness as evidenced by moderate depletions of muscle mass, moderate depletion of body fat; ongoing  GOAL:   Patient will meet greater than or equal to 90% of their needs; progressing  MONITOR:   PO intake, Supplement acceptance, Diet advancement, Weight trends, Labs, I & O's  REASON FOR ASSESSMENT:   Low Braden    ASSESSMENT:   70 y.o. right handed female with history of fibromyalgia, hypertension. Presented 12/18/2015 for numbness and weakness involving both hands and lower extremities that has progressed over the past 6-8 months. She had also been experiencing increased neck pain.  X-rays and imaging revealed cervical spondylosis with follow-up with a C3-C6. Underwent two-stage approach discectomy at C3-4, C4-5 with decompression, placement of intravertebral biomechanical device, [employer] with C3-C6 laminectomy for decompression of spinal cord posterior segmental instrumentation 12/18/2015  MBS done today. Per SLP, pt with Severe pharyngeal phase dysphagia;Moderate cervical esophageal phase dysphagia. Pt has been advanced to a dysphagia 1 diet with thin liquids from a full liquid diet. Meal completion at lunch was 80%. Pt currently has Boost Breeze ordered and has been consuming them. RD to continue with orders.  Diet Order:  DIET - DYS 1 Room service appropriate?: Yes; Fluid consistency:: Thin  Skin:   (Incision on neck)  Last BM:  6/8  Height:   Ht Readings from Last 1 Encounters:  12/21/15 5\' 9"  (1.753 m)    Weight:   Wt Readings from Last 1 Encounters:  12/29/15 136 lb (61.689 kg)    Ideal Body Weight:  65.9 kg  BMI:  Body mass index is 20.07  kg/(m^2).  Estimated Nutritional Needs:   Kcal:  1650-1850  Protein:  75-85 grams  Fluid:  1.6 - 1.8 L/day  EDUCATION NEEDS:   No education needs identified at this time  Corrin Parker, MS, RD, LDN Pager # [phone] After hours/ weekend pager # [phone]

## 2016-01-08 NOTE — Progress Notes (Signed)
Speech Language Pathology Daily Session Note  Patient Details  Name: [name] MRN: [id number] Date of Birth: 1946/02/01  Today's Date: 01/08/2016 SLP Individual Time: 1403-1500 SLP Individual Time Calculation (min): 57 min  Short Term Goals: Week 3: SLP Short Term Goal 1 (Week 3): Pt will consume puree consistencies with minimal s/s of aspiration/dysphagia with Min A for use of compensatory strategies.  SLP Short Term Goal 2 (Week 3): Pt will consume trials of dys 2 without s/s of aspiration/dysphagia with Min A for use of compensatory strategies.  SLP Short Term Goal 3 (Week 3): Pt will complete money management tasks with 90% accuracy and min A cues.  SLP Short Term Goal 4 (Week 3): Pt to demonstrate adequate vocal intensity at the single syllable word level with mod A.   Skilled Therapeutic Interventions:  Pt was seen for skilled ST targeting goals for dysphagia and communication.  Pt was able to recall recommended swallowing precautions from this morning's therapy session with supervision question cues.  Pt requested a snack of trial dys 2 textures which she consumed with supervision cues for use of swallowing precautions.  No overt s/s of aspiration evident with solids or liquids.  During functional conversations with the therapist, pt was able to achieve intelligibility at the conversational level with supervision verbal cues, even when therapist incorporated environmental distractions to increase task challenge.  Pt with 10/10 headache by the end of today's therapy session.  RN made aware.   Pt was left in bed with call bell within reach.  Continue per current plan of care.    Function:  Eating Eating   Modified Consistency Diet: Yes Eating Assist Level: Helper feeds patient           Cognition Comprehension Comprehension assist level: Follows complex conversation/direction with extra time/assistive device  Expression   Expression assist level: Expresses basic 90%  of the time/requires cueing < 10% of the time.  Social Interaction Social Interaction assist level: Interacts appropriately 90% of the time - Needs monitoring or encouragement for participation or interaction.  Problem Solving Problem solving assist level: Solves basic 90% of the time/requires cueing < 10% of the time  Memory Memory assist level: Recognizes or recalls 75 - 89% of the time/requires cueing 10 - 24% of the time    Pain Pain Assessment Faces Pain Scale: Hurts worst Pain Location: Head Pain Descriptors / Indicators: Aching Pain Intervention(s): RN made aware  Therapy/Group: Individual Therapy  Felis Quillin, Selinda Orion 01/08/2016, 4:43 PM

## 2016-01-08 NOTE — Progress Notes (Signed)
Subjective/Complaints: Still having headaches.   Review of systems denies any breathing problems no chest pain no shortness of breath, negative for nausea, vomiting, diarrhea  Objective: Vital Signs: Blood pressure 135/70, pulse 85, temperature 99.4 F (37.4 C), temperature source Oral, resp. rate 20, height 5\' 9"  (1.753 m), weight 61.689 kg (136 lb), last menstrual period 03/31/2004, SpO2 100 %. No results found. No results found for this or any previous visit (from the past 72 hour(s)).   General: No acute distress. Vital signs reviewed. Psych: Mood is up beat. More alert today.  Heart: Regular rate and rhythm no rubs murmurs or extra sounds Lungs: Clear to auscultation, breathing unlabored, no rales or wheezes Abdomen: Positive bowel sounds, soft nontender to palpation, nondistended Skin: No evidence of breakdown, no evidence of rash Neurologic: speech clear. Very alert Motor strength:  Right upper extremity: 1+ to 2- delt/bic trace to 1 tricep,wrist, 0 HI Left upper extremity: 1+ bicep,deltoid, trace wrist/tricep, 0 HI Right lower extremity: 2+/5 proximal distal Left lower extremity: 1/5 proximal to distal Sensory exam absent in bilateral lower extremities to light touch Musculoskeletal: No edema. No tenderness.  Assessment/Plan: 1. 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 Physiatrist is providing close team supervision and 24 hour management of active medical problems listed below. Physiatrist and rehab team continue to assess barriers to discharge/monitor patient progress toward functional and medical goals. FIM: Function - Bathing Bathing activity did not occur: N/A (Night bath) Position: Bed Body parts bathed by patient: Right arm, Left arm, Chest, Abdomen (Hand over hand assist) Body parts bathed by helper: Right arm, Left arm, Chest,  Abdomen, Front perineal area, Buttocks, Right upper leg, Left upper leg, Right lower leg, Left lower leg, Back Bathing not applicable: Right upper leg, Left upper leg, Right lower leg, Left lower leg, Back Assist Level: Touching or steadying assistance(Pt > 75%) (hand over hand assist using bath mit)  Function- Upper Body Dressing/Undressing What is the patient wearing?: Hospital gown Pull over shirt/dress - Perfomed by patient: Thread/unthread right sleeve, Thread/unthread left sleeve Pull over shirt/dress - Perfomed by helper: Put head through opening, Pull shirt over trunk, Thread/unthread left sleeve, Thread/unthread right sleeve Button up shirt - Perfomed by helper: Thread/unthread right sleeve, Thread/unthread left sleeve, Pull shirt around back, Button/unbutton shirt (Jacket) Assist Level: 2 helpers Function - Lower Body Dressing/Undressing What is the patient wearing?: Hospital Gown Position: Bed Pants- Performed by helper: Thread/unthread right pants leg, Thread/unthread left pants leg, Pull pants up/down Non-skid slipper socks- Performed by helper: Don/doff right sock, Don/doff left sock TED Hose - Performed by helper: Don/doff right TED hose, Don/doff left TED hose Assist for footwear: Dependant Assist for lower body dressing: 2 Helpers  Function - Toileting Toileting activity did not occur: No continent bowel/bladder event Toileting steps completed by patient: Adjust clothing prior to toileting, Performs perineal hygiene, Adjust clothing after toileting (per Berkley Harvey, Nt report) Assist level: Two helpers (Per [place], NT report)  Function - Air cabin crew transfer activity did not occur: Safety/medical concerns  Function - Chair/bed transfer Chair/bed transfer method: Lateral scoot Chair/bed transfer assist level: 2 helpers Chair/bed transfer assistive device: Sliding board Chair/bed transfer details: Verbal cues for precautions/safety, Verbal cues for safe use  of DME/AE, Manual facilitation for weight shifting, Manual facilitation for placement, Manual facilitation for weight bearing  Function - Locomotion: Wheelchair Will patient use wheelchair at discharge?: Yes Type:  Manual Wheelchair activity did not occur: Safety/medical concerns Max wheelchair distance: 25 Assist Level: 2 helpers Wheel 50 feet with 2 turns activity did not occur: Safety/medical concerns Assist Level: Dependent (Pt equals 0%) Wheel 150 feet activity did not occur: Safety/medical concerns Assist Level: Dependent (Pt equals 0%) Turns around,maneuvers to table,bed, and toilet,negotiates 3% grade,maneuvers on rugs and over doorsills: No Function - Locomotion: Ambulation Ambulation activity did not occur: Safety/medical concerns Walk 10 feet activity did not occur: Safety/medical concerns Walk 50 feet with 2 turns activity did not occur: Safety/medical concerns Walk 150 feet activity did not occur: Safety/medical concerns Walk 10 feet on uneven surfaces activity did not occur: Safety/medical concerns  Function - Comprehension Comprehension: Auditory Comprehension assist level: Follows complex conversation/direction with extra time/assistive device  Function - Expression Expression: Verbal Expression assist level: Expresses complex 90% of the time/cues < 10% of the time  Function - Social Interaction Social Interaction assist level: Interacts appropriately 90% of the time - Needs monitoring or encouragement for participation or interaction.  Function - Problem Solving Problem solving assist level: Solves basic 90% of the time/requires cueing < 10% of the time  Function - Memory Memory assist level: Recognizes or recalls 75 - 89% of the time/requires cueing 10 - 24% of the time Patient normally able to recall (first 3 days only): Current season, Location of own room, Staff names and faces, That he or she is in a hospital  Medical Problem List and Plan: 1.  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 Cervical collar at all times   - Continue CIR.   2.  DVT Prophylaxis/Anticoagulation: dopplers negative  -changed to sq lovenox  -Vascular ultrasound negative for lower extremity DVT on 5/31 3. Pain Management/fibromyalgia/chronic back pain: Chronic prednisone 10 mg twice a day,  Oxycodone and Zanaflex as needed,    -Lyrica decreased to 25mg  tid due to lethargy  -robaxin stopped    -added sports cream  scheduled. 4. Mood: Xanax 0.5 mg twice a day 5. Neuropsych: This patient is capable of making decisions on her own behalf. 6. Skin/Wound Care: Routine skin checks 7. Fluids/Electrolytes/Nutrition: intake suboptimal  -megace for appetite which is picking up somewhat  -  Repleted potassium 8. Hypertension. Hydrochlorothiazide 25 mg daily, lisinopril 20 mg daily. Fair control 9. Constipation. Laxative assistance 10. Hyperlipidemia.Lovaza 11. Neurogenic bladder/bowels  continue ICP program  -cath volumes 300-600 generally  -check with RN regarding bowels. No stools recorded. Pt refused suppository 12. Hypoalbuminemia--added pro-stat 13. Escherichia coli UTI  -keflex for 7 days total.     LOS (Days) 18 A FACE TO FACE EVALUATION WAS PERFORMED  Moni Rothrock T 01/08/2016, 8:26 AM

## 2016-01-08 NOTE — Progress Notes (Signed)
Physical Therapy Session Note  Patient Details  Name: SHAMBRICA LESLEY MRN: [id number] Date of Birth: 18-Jan-1946  Today's Date: 01/08/2016 PT Individual Time: 1000-1100 PT Individual Time Calculation (min): 60 min   Short Term Goals: Week 3:  PT Short Term Goal 1 (Week 3): Pt will be able to demonstrate sitting balance with max assist during functional task EOM PT Short Term Goal 2 (Week 3): Pt will be able to perform basic transfers with max assist  Skilled Therapeutic Interventions/Progress Updates:   Session focused on dressing at bed level with total assist and mod to max assist for rolling, slideboard transfers during session with max to total assist (+2 for equipment and safety) with focus on pt working on balance, and neuro re-ed to address motor control, postural control, balance, and weightshifting in seated position EOM (max to total A) to kick ball, maintain balance, and activate UE's with therapy ball supporting UE's. End of session handoff to RN in room to administer medication.  Therapy Documentation Precautions:  Precautions Precautions: Fall, Cervical Required Braces or Orthoses: Cervical Brace Cervical Brace: Hard collar, At all times Restrictions Weight Bearing Restrictions: No  Pain:  c/o pain in neck and shoulders - RN administering pain medication.    See Function Navigator for Current Functional Status.   Therapy/Group: Individual Therapy  Canary Brim Ivory Broad, PT, DPT  01/08/2016, 11:51 AM

## 2016-01-09 ENCOUNTER — Inpatient Hospital Stay (HOSPITAL_COMMUNITY): Payer: Medicare Other | Admitting: Physical Therapy

## 2016-01-09 ENCOUNTER — Inpatient Hospital Stay (HOSPITAL_COMMUNITY): Payer: Medicare Other | Admitting: Speech Pathology

## 2016-01-09 ENCOUNTER — Inpatient Hospital Stay (HOSPITAL_COMMUNITY): Payer: Medicare Other | Admitting: Occupational Therapy

## 2016-01-09 MED ORDER — BISACODYL 10 MG RE SUPP
10.0000 mg | Freq: Every day | RECTAL | Status: DC
  Administered 2016-01-10 – 2016-01-14 (×4): 10 mg via RECTAL
  Filled 2016-01-09 (×6): qty 1

## 2016-01-09 MED ORDER — SENNOSIDES-DOCUSATE SODIUM 8.6-50 MG PO TABS
2.0000 | ORAL_TABLET | Freq: Every day | ORAL | Status: DC
  Administered 2016-01-10 – 2016-01-11 (×2): 2 via ORAL
  Filled 2016-01-09 (×3): qty 2

## 2016-01-09 MED ORDER — BISACODYL 10 MG RE SUPP
10.0000 mg | Freq: Every day | RECTAL | Status: DC | PRN
  Administered 2016-01-09: 10 mg via RECTAL

## 2016-01-09 NOTE — Progress Notes (Signed)
Physical Therapy Session Note  Patient Details  Name: [name] MRN: [id number] Date of Birth: 25-Nov-1945  Today's Date: 01/09/2016 PT Individual Time: 1030-1157 PT Individual Time Calculation (min): 87 min   Short Term Goals: Week 2:  PT Short Term Goal 1 (Week 2): Pt will be able to tolerate OOB x 2 hours at a time in w/c PT Short Term Goal 1 - Progress (Week 2): Met PT Short Term Goal 2 (Week 2): Pt will be able to demonstrate sitting balance with max assist during functional task EOM PT Short Term Goal 2 - Progress (Week 2): Not met PT Short Term Goal 3 (Week 2): Pt will be able to perform transfers with max assist PT Short Term Goal 3 - Progress (Week 2): Not met Week 3:  PT Short Term Goal 1 (Week 3): Pt will be able to demonstrate sitting balance with max assist during functional task EOM PT Short Term Goal 2 (Week 3): Pt will be able to perform basic transfers with max assist  Skilled Therapeutic Interventions/Progress Updates:    Patient received sitting in [facility] with friend present in room and agreeable to PT.  PT instructed patient WC mobility 1 arm at a time with max A from PT on R UE and Max +2 to maintain straight progression on LUE. PT utilized hand over hand technique for manual facilitation    UE NMR for BUE sqeeze on beach ball x 7 and BUE shoulder flexion with ball between hands with min facilitation at scapula for improved rotational movement.     Transfer training with SB with total A x 2. PT required to provided multimodal instruction for UE placement, trunk positioning, weight shifting, WB through BLE and improved posture.   Sitting balance for a total of 15 minutes with 5 bouts of 3 minutes, up to 6 seconds without Assist from PT . Max-total A to correct LOB in any direction.  Tilt table to improve WB through BLE with BP assessment. Patient performed transfer with maximove +2 assist  0deg : 125/76 20deg: 111/65 at 1 minute and 114/65 at 3 minutes.  asymptomatic 35deg: 108/69 at 1 minute with mild dizziness. 109/60 at 3 minutes with increased dizziness 20deg: 111/68 at 2 minutes, asymptomatic 0deg : 127/69.   Patient returned to room and left sitting in [facility] with call bell within reach and pillows under each arm.      Therapy Documentation Precautions:  Precautions Precautions: Fall, Cervical Required Braces or Orthoses: Cervical Brace Cervical Brace: Hard collar, At all times Restrictions Weight Bearing Restrictions: No  See Function Navigator for Current Functional Status.   Therapy/Group: Individual Therapy  Lorie Phenix 01/09/2016, 12:50 PM

## 2016-01-09 NOTE — Progress Notes (Signed)
Occupational Therapy Session Note  Patient Details  Name: [name] MRN: [id number] Date of Birth: [birth year]  Today's Date: 01/09/2016 OT Individual Time: [id number] OT Individual Time Calculation (min): 60 min    Short Term Goals: Week 3:  OT Short Term Goal 1 (Week 3): Pt will complete oral care with min A using universal cuff  OT Short Term Goal 2 (Week 3): Pt wil direct caregiver with upper body dressing with Mod I  OT Short Term Goal 3 (Week 3): Pt will tolerate sitting EOB/EOM for 5 minutes to increase functional activity tolerance  Skilled Therapeutic Interventions/Progress Updates:    Pt seen for skilled OT session focusing on ADL re-training, and stretching RUE for increased functional mobility. Upon arrival pt supine in bed with RN present and agreeable to tx session. Total A to don TEDS, non-slip socks and pants with total A x2 to pull pants up rolling in bed. Pt required Max A x2 to transfer to w/c from bed using sliding board. Once in w/c pt dressed UB with total  A, but shown improvements in initiating threading of shirt sleeves with setup and VC. Pt brushed teeth from w/c using R dorsal universal cuff while therapist held arm at elbow and wrist for support. Pt stated her goal is to wash her face therefore OT wrapped wash cloth in pt R hand. Pt was able to bring wash cloth to face with min A for support. OT wrapped coban around comb and deodorant for pt to practice grooming with items. With min A for elbow and wrist support pt was able to hold items due to increased diameter and texture. OT stretched pt triceps and biceps with pt really working on extending out. Pt was able to extend R arm more compared to past sessions and OT educated on the importance of continuing to stretch muscles. Pt left in w/c with call bell in reach.    Therapy Documentation Precautions:  Precautions Precautions: Fall, Cervical Required Braces or Orthoses: Cervical Brace Cervical Brace: Hard  collar, At all times Restrictions Weight Bearing Restrictions: No    Therapy/Group: Individual Therapy  Matilde Bash 01/09/2016, 9:51 AM

## 2016-01-09 NOTE — Patient Care Conference (Signed)
Inpatient RehabilitationTeam Conference and Plan of Care Update Date: 01/08/2016   Time: 2:00 PM    Patient Name: [name]      Medical Record Number: [id number]  Date of Birth: 21-Jul-1946 Sex: Female         Room/Bed: 4W07C/4W07C-01 Payor Info: Payor: MEDICARE / Plan: MEDICARE PART A AND B / Product Type: *No Product type* /    Admitting Diagnosis: cervical myelopathy  Admit Date/Time:  12/21/2015 12:59 PM Admission Comments: No comment available   Primary Diagnosis:  <principal problem not specified> Principal Problem: <principal problem not specified>  Patient Active Problem List   Diagnosis Date Noted  . E. coli UTI   . Myelopathy ([place]) 12/21/2015  . Abnormality of gait   . Acute incomplete spastic tetraplegia ([place])   . Fibromyalgia   . Chronic back pain   . Anxiety state   . Benign essential HTN   . Slow transit constipation   . HLD (hyperlipidemia)   . Urinary frequency   . Cervical spondylosis with myelopathy 12/18/2015  . Chest pain 03/27/2014  . Unspecified constipation 12/02/2013  . Hypokalemia 05/31/2013  . Insomnia 05/29/2011  . Lipoma 02/05/2011  . HYPERLIPIDEMIA 09/24/2006  . Depression 04/29/2006  . Essential hypertension 04/29/2006  . Myalgia and myositis 04/29/2006  . GERD 03/15/1998    Expected Discharge Date: Expected Discharge Date:  (SNF)  Team Members Present: Physician leading conference: Dr. Alger Simons Social Worker Present: Lennart Pall, LCSW Nurse Present: Heather Roberts, RN PT Present: Canary Brim, PT OT Present: Napoleon Form, OT SLP Present: Weston Anna, SLP PPS Coordinator present : Daiva Nakayama, RN, CRRN     Current Status/Progress Goal Weekly Team Focus  Medical   pain an issue still (likely in part due to her hx of FMS). more alert  see prior  pain, swallow, nutrition   Bowel/Bladder   I and O cath q 6-8. No BM since 6/8. Patient refusing suppository. On scheduled sennokot.  Patient to be able to move her bowels.  Managed i and O cath  bowel movement not more than 3 days.   Swallow/Nutrition/ Hydration   Dys.1 textures and thin liquids   Min assist for recall   tolerance of current diet    ADL's   Max- total A for ADLs; Max- total A sliding board transfers  Max- total A overall with pt directing care  Pain management, positioning, ADL re-training; pt directing care   Mobility   max to total A w/c level; pain and fatigue limit participation  Downgraded to mod-max A overall due to pt now SNF placement  activity tolerance, pain management, trunk control/posture, balance, transfers, strengthening   Communication   Min assist  Mod I  increase breath support for increased vocal intensity    Safety/Cognition/ Behavioral Observations  Mod assist   Min assist  problem solving with familiar and funcitonal tasks   Pain   Pain to bilateral shoulders, pins and needles to BLE and intermittent headaches.   Pain less than or equal to 2  Monitor for pain and medicate PRN   Skin   incision to right neck area with bruising and edema. Cervical collar at all times.  No skin breakdown  assess skin daily and prn      *See Care Plan and progress notes for long and short-term goals.  Barriers to Discharge: pronounced neuro deficits    Possible Resolutions to Barriers:  assistance after discharge. see prior    Discharge Planning/Teaching Needs:  Discharge plan has changed to SNF as family cannot meet care needs.      Team Discussion:  More alert this week (pain meds adjusted).  No true neurological change noted.  Still total assistance with sitting balance and poor pain tolerance.  MBS today = Dys 1 with thin and tolerating well.  Need to continue to encourage po intake.  Revisions to Treatment Plan:  None   Continued Need for Acute Rehabilitation Level of Care: The patient requires daily medical management by a physician with specialized training in physical medicine and rehabilitation for the following  conditions: Daily direction of a multidisciplinary physical rehabilitation program to ensure safe treatment while eliciting the highest outcome that is of practical value to the patient.: Yes Daily medical management of patient stability for increased activity during participation in an intensive rehabilitation regime.: Yes Daily analysis of laboratory values and/or radiology reports with any subsequent need for medication adjustment of medical intervention for : Neurological problems;Post surgical problems  [name] 01/09/2016, 12:16 PM

## 2016-01-09 NOTE — Progress Notes (Signed)
Speech Language Pathology Daily Session Note  Patient Details  Name: [name] MRN: [id number] Date of Birth: 1946/04/23  Today's Date: 01/09/2016 SLP Individual Time: 1345-1425 SLP Individual Time Calculation (min): 40 min  Short Term Goals: Week 3: SLP Short Term Goal 1 (Week 3): Pt will consume puree consistencies with minimal s/s of aspiration/dysphagia with Min A for use of compensatory strategies.  SLP Short Term Goal 2 (Week 3): Pt will consume trials of dys 2 without s/s of aspiration/dysphagia with Min A for use of compensatory strategies.  SLP Short Term Goal 3 (Week 3): Pt will complete money management tasks with 90% accuracy and min A cues.  SLP Short Term Goal 4 (Week 3): Pt to demonstrate adequate vocal intensity at the word level with mod A.  SLP Short Term Goal 4 - Progress (Week 3): Met SLP Short Term Goal 5 (Week 3): Pt to demonstrate adequate vocal intensity at the converation level with environmental distractions wtih Supervision level verbal cues.     Skilled Therapeutic Interventions: Skilled treatment session focused on communication and cognitive goals. SLP facilitated session by providing Min A question cues for patient to recall swallowing compensatory strategies needed to consume current diet safely. Clinician also provided an external aid to maximize recall and carryover of information. Patient also educated on the importance of directing her care in regards to swallowing compensatory strategies to maximize her safety due to patient being a dependent feeder. Patient declined any trials of solids/liquids due to recently completing lunch and needing a suppository.  Patient participated in a functional conversation in a minimally distracting environment with 100% intelligibility with supervision verbal cues. Patient left upright in wheelchair with quick release belt in place and all needs within reach.    Function:    Cognition Comprehension  Comprehension assist level: Follows basic conversation/direction with no assist  Expression   Expression assist level: Expresses basic 90% of the time/requires cueing < 10% of the time.  Social Interaction Social Interaction assist level: Interacts appropriately 90% of the time - Needs monitoring or encouragement for participation or interaction.  Problem Solving Problem solving assist level: Solves basic 90% of the time/requires cueing < 10% of the time  Memory Memory assist level: Recognizes or recalls 75 - 89% of the time/requires cueing 10 - 24% of the time    Pain Pain Assessment Pain Score: 3   Therapy/Group: Individual Therapy  [name] 01/09/2016, 2:40 PM
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
## 2016-01-09 NOTE — Progress Notes (Signed)
Social Work Patient ID: [name], female   DOB: 1945/12/17, 70 y.o.   MRN: [id number]   Pt and sister aware that I continue to work on SNF placement for pt.  Assisted with completion of MA application last week and mailed to DSS.  Will follow up on status at end of the week.  Will keep team posted of bed search progress.  Abeera Flannery, LCSW

## 2016-01-09 NOTE — Patient Care Conference (Deleted)
Inpatient RehabilitationTeam Conference and Plan of Care Update Date: 01/08/2016   Time: 2:00 PM    Patient Name: [name]      Medical Record Number: [id number]  Date of Birth: 08-Jul-1946 Sex: Female         Room/Bed: 4W07C/4W07C-01 Payor Info: Payor: MEDICARE / Plan: MEDICARE PART A AND B / Product Type: *No Product type* /    Admitting Diagnosis: cervical myelopathy  Admit Date/Time:  12/21/2015 12:59 PM Admission Comments: No comment available   Primary Diagnosis:  <principal problem not specified> Principal Problem: <principal problem not specified>  Patient Active Problem List   Diagnosis Date Noted  . E. coli UTI   . Myelopathy ([place]) 12/21/2015  . Abnormality of gait   . Acute incomplete spastic tetraplegia ([place])   . Fibromyalgia   . Chronic back pain   . Anxiety state   . Benign essential HTN   . Slow transit constipation   . HLD (hyperlipidemia)   . Urinary frequency   . Cervical spondylosis with myelopathy 12/18/2015  . Chest pain 03/27/2014  . Unspecified constipation 12/02/2013  . Hypokalemia 05/31/2013  . Insomnia 05/29/2011  . Lipoma 02/05/2011  . HYPERLIPIDEMIA 09/24/2006  . Depression 04/29/2006  . Essential hypertension 04/29/2006  . Myalgia and myositis 04/29/2006  . GERD 03/15/1998    Expected Discharge Date: Expected Discharge Date:  (SNF)  Team Members Present: Physician leading conference: Dr. Alger Simons Social Worker Present: Lennart Pall, LCSW Nurse Present: Heather Roberts, RN PT Present: Canary Brim, PT OT Present: Napoleon Form, OT SLP Present: Weston Anna, SLP PPS Coordinator present : Daiva Nakayama, RN, CRRN     Current Status/Progress Goal Weekly Team Focus  Medical   pain an issue still (likely in part due to her hx of FMS). more alert  see prior  pain, swallow, nutrition   Bowel/Bladder   I and O cath q 6-8. No BM since 6/8. Patient refusing suppository. On scheduled sennokot.  Patient to be able to move her bowels.  Managed i and O cath  bowel movement not more than 3 days.   Swallow/Nutrition/ Hydration   Dys.1 textures and thin liquids   Min assist for recall   tolerance of current diet    ADL's   Max- total A for ADLs; Max- total A sliding board transfers  Max- total A overall with pt directing care  Pain management, positioning, ADL re-training; pt directing care   Mobility   max to total A w/c level; pain and fatigue limit participation  Downgraded to mod-max A overall due to pt now SNF placement  activity tolerance, pain management, trunk control/posture, balance, transfers, strengthening   Communication   Min assist  Mod I  increase breath support for increased vocal intensity    Safety/Cognition/ Behavioral Observations  Mod assist   Min assist  problem solving with familiar and funcitonal tasks   Pain   Pain to bilateral shoulders, pins and needles to BLE and intermittent headaches.   Pain less than or equal to 2  Monitor for pain and medicate PRN   Skin   incision to right neck area with bruising and edema. Cervical collar at all times.  No skin breakdown  assess skin daily and prn      *See Care Plan and progress notes for long and short-term goals.  Barriers to Discharge: pronounced neuro deficits    Possible Resolutions to Barriers:  assistance after discharge. see prior    Discharge Planning/Teaching Needs:  Discharge plan has changed to SNF as family cannot meet care needs.      Team Discussion:  MD continues to address pain.  D/c'd all ambulation goals as pain is a barrier.  Meeting supervision w/c level goals and recommend moving up d/c date.  Need to double check house measurements with mother.  Ready for family ed.  Revisions to Treatment Plan:  D/c amb goals.  Change in d/c date to 6/16   Continued Need for Acute Rehabilitation Level of Care: The patient requires daily medical management by a physician with specialized training in physical medicine and rehabilitation  for the following conditions: Daily direction of a multidisciplinary physical rehabilitation program to ensure safe treatment while eliciting the highest outcome that is of practical value to the patient.: Yes Daily medical management of patient stability for increased activity during participation in an intensive rehabilitation regime.: Yes Daily analysis of laboratory values and/or radiology reports with any subsequent need for medication adjustment of medical intervention for : Neurological problems;Post surgical problems  Allon Costlow 01/09/2016, 12:14 PM

## 2016-01-09 NOTE — Progress Notes (Signed)
Social Work Patient ID: [name], female   DOB: May 13, 1946, 70 y.o.   MRN: [id number]   Cindy Curb, LCSW Social Worker Signed  Patient Care Conference 01/09/2016 12:16 PM    Expand All Collapse All   Inpatient RehabilitationTeam Conference and Plan of Care Update Date: 01/08/2016   Time: 2:00 PM     Patient Name: [name]       Medical Record Number: [id number]  Date of Birth: 1945-12-20 Sex: Female         Room/Bed: 4W07C/4W07C-01 Payor Info: Payor: MEDICARE / Plan: MEDICARE PART A AND B / Product Type: *No Product type* /    Admitting Diagnosis: cervical myelopathy   Admit Date/Time:  12/21/2015 12:59 PM Admission Comments: No comment available   Primary Diagnosis:  <principal problem not specified> Principal Problem: <principal problem not specified>    Patient Active Problem List     Diagnosis  Date Noted   .  E. coli UTI     .  Myelopathy ([place])  12/21/2015   .  Abnormality of gait     .  Acute incomplete spastic tetraplegia ([place])     .  Fibromyalgia     .  Chronic back pain     .  Anxiety state     .  Benign essential HTN     .  Slow transit constipation     .  HLD (hyperlipidemia)     .  Urinary frequency     .  Cervical spondylosis with myelopathy  12/18/2015   .  Chest pain  03/27/2014   .  Unspecified constipation  12/02/2013   .  Hypokalemia  05/31/2013   .  Insomnia  05/29/2011   .  Lipoma  02/05/2011   .  HYPERLIPIDEMIA  09/24/2006   .  Depression  04/29/2006   .  Essential hypertension  04/29/2006   .  Myalgia and myositis  04/29/2006   .  GERD  03/15/1998     Expected Discharge Date: Expected Discharge Date:  (SNF)  Team Members Present: Physician leading conference: Dr. Alger Stark Social Worker Present: Cindy Pall, LCSW Nurse Present: Cindy Roberts, RN PT Present: [name], PT OT Present: [name], OT SLP Present: [name], SLP PPS Coordinator present : Cindy Nakayama, RN, CRRN        Current Status/Progress   Goal  Weekly Team Focus   Medical     pain an issue still (likely in part due to her hx of FMS). more alert  see prior  pain, swallow, nutrition   Bowel/Bladder     I and O cath q 6-8. No BM since 6/8. Patient refusing suppository. On scheduled sennokot.  Patient to be able to move her bowels. Managed i and O cath  bowel movement not more than 3 days.    Swallow/Nutrition/ Hydration     Dys.1 textures and thin liquids   Min assist for recall   tolerance of current diet    ADL's     Max- total A for ADLs; Max- total A sliding board transfers   Max- total A overall with pt directing care  Pain management, positioning, ADL re-training; pt directing care   Mobility     max to total A w/c level; pain and fatigue limit participation  Downgraded to mod-max A overall due to pt now SNF placement   activity tolerance, pain management, trunk control/posture, balance, transfers, strengthening   Communication  Min assist  Mod I  increase breath support for increased vocal intensity    Safety/Cognition/ Behavioral Observations    Mod assist   Min assist  problem solving with familiar and funcitonal tasks    Pain     Pain to bilateral shoulders, pins and needles to BLE and intermittent headaches.   Pain less than or equal to 2  Monitor for pain and medicate PRN   Skin     incision to right neck area with bruising and edema. Cervical collar at all times.  No skin breakdown  assess skin daily and prn      *See Care Plan and progress notes for long and short-term goals.    Barriers to Discharge:  pronounced neuro deficits     Possible Resolutions to Barriers:   assistance after discharge. see prior      Discharge Planning/Teaching Needs:   Discharge plan has changed to SNF as family cannot meet care needs.       Team Discussion:    More alert this week (pain meds adjusted).  No true neurological change noted.  Still total assistance with sitting balance and poor pain tolerance.  MBS today  = Dys 1 with thin and tolerating well.  Need to continue to encourage po intake.   Revisions to Treatment Plan:    None    Continued Need for Acute Rehabilitation Level of Care: The patient requires daily medical management by a physician with specialized training in physical medicine and rehabilitation for the following conditions: Daily direction of a multidisciplinary physical rehabilitation program to ensure safe treatment while eliciting the highest outcome that is of practical value to the patient.: Yes Daily medical management of patient stability for increased activity during participation in an intensive rehabilitation regime.: Yes Daily analysis of laboratory values and/or radiology reports with any subsequent need for medication adjustment of medical intervention for : Neurological problems;Post surgical problems  [name] 01/09/2016, 12:16 PM                 [name], [place] Worker Addendum  Patient Care Conference 01/01/2016  3:12 PM    Expand All Collapse All   Inpatient RehabilitationTeam Conference and Plan of Care Update Date: 01/01/2016   Time: 2:00 PM     Patient Name: [name] Record Number: [id number]  Date of Birth: May 24, 1946 Sex: Female         Room/Bed: 4W07C/4W07C-01 Payor Info: Payor: MEDICARE / Plan: MEDICARE PART A AND B / Product Type: *No Product type* /    Admitting Diagnosis: cervical myelopathy  Admit Date/Time:  12/21/2015 12:59 PM Admission Comments: No comment available   Primary Diagnosis:  <principal problem not specified> Principal Problem: <principal problem not specified>    Patient Active Problem List     Diagnosis  Date Noted   .  E. coli UTI     .  Myelopathy ([place])  12/21/2015   .  Abnormality of gait     .  Acute incomplete spastic tetraplegia ([place])     .  Fibromyalgia     .  Chronic back pain     .  Anxiety state     .  Benign essential HTN     .  Slow transit constipation     .  HLD  (hyperlipidemia)     .  Urinary frequency     .  Cervical spondylosis with myelopathy  12/18/2015   .  Chest pain  03/27/2014   .  Unspecified constipation  12/02/2013   .  Hypokalemia  05/31/2013   .  Insomnia  05/29/2011   .  Lipoma  02/05/2011   .  HYPERLIPIDEMIA  09/24/2006   .  Depression  04/29/2006   .  Essential hypertension  04/29/2006   .  Myalgia and myositis  04/29/2006   .  GERD  03/15/1998     Expected Discharge Date: Expected Discharge Date:  (SNF)  Team Members Present: Physician leading conference: Dr. Alger Stark Social Worker Present: Cindy Pall, LCSW Nurse Present: Cindy Roberts, RN PT Present: [name], PT OT Present: [name], OT SLP Present: Gunnar Fusi, SLP PPS Coordinator present : Cindy Nakayama, RN, CRRN        Current Status/Progress  Goal  Weekly Team Focus   Medical     slow neuro progress. continued pain likely complicated by FMS   increased activity tolerance  pain control, nutrition, education    Bowel/Bladder     Requires I&O cath q 6-8hrs, Incontinent of bowel. LBM 12/31/15  Managed bowel and bladder program  Initiate bowel program    Swallow/Nutrition/ Hydration     Full liquid diet, Sueprvision-Min A for use of strategies with increase in overt s/s of aspiration  Min A   tolerance of current diet and trials of advanced textures     ADL's     Max- total A +2 overall for ADLs; Total A functional transfers with +2 for safety via sliding board  Modified to max A overall with pt directing caregiver   Pain management; ADL re-training; pr directiong care; functional positioning.    Mobility     max to total assist w/c level  downgraded to mod/max assist overall w/c level due to minimal progress  education, OOB tolerance, transfers, balance, trunk control, standing with lift equipment   Communication     Min-Mod A  Mod I  increase arousal and use of speech intelligibilty strategies    Safety/Cognition/ Behavioral Observations    Max-Max A   Min A  problem solving with familiar and functional tasks    Pain     Percocet 1-2tabs q 4hrs prn, Scheduled Lyrica and Robaxin    <2  Premedicate pt prior to initial therapy session    Skin     R neck incision slightly edemaous, cervical collar on at all times  No additional skin breakdown  Assess skin daily      *See Care Plan and progress notes for long and short-term goals.    Barriers to Discharge:  severity of neuro deficits     Possible Resolutions to Barriers:   adaptive equipment/wheelchair, family ed     Discharge Planning/Teaching Needs:   Home with sister and brother-in-law who can provide 24/7 assistance.  May need to consider SNF if sister does not feel she can provide needed assist.  to be scheduled    Team Discussion:    More lethargic and this is likely due to medications.  Plan to decreased pain meds.  Currently total assist +2.  Constant pain complaints but does try to work through with therapies.  SW reports expect plan to change to SNF as sister cannot meet care needs - to confirm by tomorrow.  showing overt s/s of swallowing issues - may be r/t level of alertness due to meds but may need to do another swallow study.   Revisions to Treatment Plan:    Expect change in d/c  plan to SNF    Continued Need for Acute Rehabilitation Level of Care: The patient requires daily medical management by a physician with specialized training in physical medicine and rehabilitation for the following conditions: Daily direction of a multidisciplinary physical rehabilitation program to ensure safe treatment while eliciting the highest outcome that is of practical value to the patient.: Yes Daily medical management of patient stability for increased activity during participation in an intensive rehabilitation regime.: Yes Daily analysis of laboratory values and/or radiology reports with any subsequent need for medication adjustment of medical intervention for : Neurological problems;Post  surgical problems  Taisei Bonnette 01/02/2016, 10:43 AM        Revision History      Date/Time User Provider Type Action    01/02/2016 10:44 AM Cindy Curb, LCSW Social Worker Addend    01/01/2016  3:17 PM Cindy Curb, LCSW Social Worker Sign    View Details Report                 [name], [place] Worker Signed  Patient Care Conference 12/26/2015  1:25 PM    Expand All Collapse All   Inpatient RehabilitationTeam Conference and Plan of Care Update Date: 12/25/2015   Time: 2:20 PM     Patient Name: [place] Record Number: [id number]  Date of Birth: 05-Jun-1946 Sex: Female         Room/Bed: 4W07C/4W07C-01 Payor Info: Payor: MEDICARE / Plan: MEDICARE PART A AND B / Product Type: *No Product type* /    Admitting Diagnosis: cervical myelopathy   Admit Date/Time:  12/21/2015 12:59 PM Admission Comments: No comment available   Primary Diagnosis:  <principal problem not specified> Principal Problem: <principal problem not specified>    Patient Active Problem List     Diagnosis  Date Noted   .  Myelopathy ([place])  12/21/2015   .  Abnormality of gait     .  Acute incomplete spastic tetraplegia ([place])     .  Fibromyalgia     .  Chronic back pain     .  Anxiety state     .  Benign essential HTN     .  Slow transit constipation     .  HLD (hyperlipidemia)     .  Urinary frequency     .  Cervical spondylosis with myelopathy  12/18/2015   .  Chest pain  03/27/2014   .  Unspecified constipation  12/02/2013   .  Hypokalemia  05/31/2013   .  Insomnia  05/29/2011   .  Lipoma  02/05/2011   .  HYPERLIPIDEMIA  09/24/2006   .  Depression  04/29/2006   .  Essential hypertension  04/29/2006   .  Myalgia and myositis  04/29/2006   .  GERD  03/15/1998     Expected Discharge Date: Expected Discharge Date: 01/18/16  Team Members Present: Physician leading conference: Dr. Alger Stark Social Worker Present: Cindy Pall, LCSW Nurse Present: Dorien Chihuahua,  RN PT Present: Jorge Mandril, PT OT Present: [name], OT SLP Present: [name], SLP PPS Coordinator present : Cindy Nakayama, RN, CRRN        Current Status/Progress  Goal  Weekly Team Focus   Medical     cervical myelpahty s/p C3-6 fusion. incomplete SCI. neruogenic bowel and bladder  neurological improvement  bowel and bladder mgt, skin care, pain control    Bowel/Bladder  in & out cath q6-8 hrs, incontinent LBM 5/28  able to void, continent  monitor   Swallow/Nutrition/ Hydration     Full liquid diet, Supervision-Min A for use of strategies   Min A   tolerance of current diet, use of swallow strateiges, possible trials of upgraded textures    ADL's     Total A LB dressing; max- total A UB dressing; Total A functional transfers using sliding board; max- total self feeding and grooming   Min A overall  Pain management, ADL re-training; sitting balance    Mobility     Total A for bed mobility, slideboard transfers, w/c mobility in tilt in space, standing  Min A overall  Pain management, balance/trunk control, transfers and WB through LE   Communication     Supervision-Min A  Mod I  increased use of speech intelligibility strategies    Safety/Cognition/ Behavioral Observations    Mod A  Min A  problem solving with functional tasks    Pain     percocet 1-2 tab q4 hrs PRN, scheduled Lyrica. Requires percocet only BID most days  < 2      Skin     neck incisions with dermabond, cervical collar   free of skin breakdown & infection   monitor skin    Rehab Goals Patient on target to meet rehab goals: Yes *See Care Plan and progress notes for long and short-term goals.    Barriers to Discharge:  level of injury, severity      Possible Resolutions to Barriers:   continued education, assistance at home for bowel/bladder/care     Discharge Planning/Teaching Needs:   Home with sister and brother-in-law who can provide 24/7 assistance.  to be scheduled    Team Discussion:     currrently max - total assistance with PT/OT.  Still on full liquid and not ready to upgrade.  Picking up on mild cognitive deficits i.e.memory, money management and recall.  Significantly weak and anticipate longer LOS>   Revisions to Treatment Plan:    None    Continued Need for Acute Rehabilitation Level of Care: The patient requires daily medical management by a physician with specialized training in physical medicine and rehabilitation for the following conditions: Daily direction of a multidisciplinary physical rehabilitation program to ensure safe treatment while eliciting the highest outcome that is of practical value to the patient.: Yes Daily medical management of patient stability for increased activity during participation in an intensive rehabilitation regime.: Yes Daily analysis of laboratory values and/or radiology reports with any subsequent need for medication adjustment of medical intervention for : Post surgical problems;Urological problems;Wound care problems;Neurological problems  Amillion Scobee 12/26/2015, 3:19 PM

## 2016-01-10 ENCOUNTER — Inpatient Hospital Stay (HOSPITAL_COMMUNITY): Payer: Medicare Other | Admitting: Speech Pathology

## 2016-01-10 ENCOUNTER — Inpatient Hospital Stay (HOSPITAL_COMMUNITY): Payer: Medicare Other | Admitting: Occupational Therapy

## 2016-01-10 ENCOUNTER — Inpatient Hospital Stay (HOSPITAL_COMMUNITY): Payer: Medicare Other

## 2016-01-10 NOTE — NC FL2 (Signed)
[place] MEDICAID FL2 LEVEL OF CARE SCREENING TOOL     IDENTIFICATION  Patient Name: [name] Birthdate: Aug 29, 1945 Sex: female Admission Date (Current Location): 12/21/2015  [facility] and [place] Number:  Herbalist and Address:  [facility], [place] [address] [place], [place] [address]      Provider Number: [id number]  Attending Physician Name and Address:  Meredith Staggers, MD  Relative Name and Phone Number:       Current Level of Care: Other (Comment) (Inpatient Rehabilitation ) Recommended Level of Care: [place] Prior Approval Number:    Date Approved/Denied:   PASRR Number: [id number]  Discharge Plan: SNF    Current Diagnoses: Patient Active Problem List   Diagnosis Date Noted  . E. coli UTI   . Myelopathy ([place]) 12/21/2015  . Abnormality of gait   . Acute incomplete spastic tetraplegia ([place])   . Fibromyalgia   . Chronic back pain   . Anxiety state   . Benign essential HTN   . Slow transit constipation   . HLD (hyperlipidemia)   . Urinary frequency   . Cervical spondylosis with myelopathy 12/18/2015  . Chest pain 03/27/2014  . Unspecified constipation 12/02/2013  . Hypokalemia 05/31/2013  . Insomnia 05/29/2011  . Lipoma 02/05/2011  . HYPERLIPIDEMIA 09/24/2006  . Depression 04/29/2006  . Essential hypertension 04/29/2006  . Myalgia and myositis 04/29/2006  . GERD 03/15/1998    Orientation RESPIRATION BLADDER Height & Weight     Self, Time, Place, Situation  Normal Incontinent, External catheter (I/O cath q 6-8 hrs) Weight: 61.689 kg (136 lb) Height:  5\' 9"  (175.3 cm)  BEHAVIORAL SYMPTOMS/MOOD NEUROLOGICAL BOWEL NUTRITION STATUS      Incontinent Diet (dys 1 with thin liquids)  AMBULATORY STATUS COMMUNICATION OF NEEDS Skin   Total Care Verbally Surgical wounds (incision to rihgt neck area with bruising and edema.  Cervical collar at all times  )                       Personal  Care Assistance Level of Assistance  Total care, Dressing, Bathing, Feeding Bathing Assistance: Maximum assistance Feeding assistance: Maximum assistance   Total Care Assistance: Maximum assistance   Functional Limitations Info             SPECIAL CARE FACTORS FREQUENCY  PT (By licensed PT), OT (By licensed OT), Speech therapy     PT Frequency: 5x/wk OT Frequency: 5x/wk     Speech Therapy Frequency: 5x/wk      Contractures      Additional Factors Info                  Current Medications (01/10/2016):  This is the current hospital active medication list Current Facility-Administered Medications  Medication Dose Route Frequency Provider Last Rate Last Dose  . acetaminophen (TYLENOL) tablet 500 mg  500 mg Oral QID Meredith Staggers, MD   500 mg at 01/10/16 0914  . ALPRAZolam Duanne Moron) tablet 0.5 mg  0.5 mg Oral BID Lavon Paganini Angiulli, PA-C   0.5 mg at 01/10/16 0915  . aspirin chewable tablet 81 mg  81 mg Oral Daily Lavon Paganini Angiulli, PA-C   81 mg at 01/10/16 0916  . bisacodyl (DULCOLAX) suppository 10 mg  10 mg Rectal QPC supper Meredith Staggers, MD   0 mg at 01/09/16 1727  . bisacodyl (DULCOLAX) suppository 10 mg  10 mg Rectal Daily PRN Cathlyn Parsons,  PA-C   10 mg at 01/09/16 1727  . enoxaparin (LOVENOX) injection 30 mg  30 mg Subcutaneous Q12H Meredith Staggers, MD   30 mg at 01/10/16 0916  . famotidine (PEPCID) tablet 20 mg  20 mg Oral Daily Lavon Paganini Angiulli, PA-C   20 mg at 01/10/16 0915  . feeding supplement (BOOST / RESOURCE BREEZE) liquid 1 Container  1 Container Oral TID BM Meredith Staggers, MD   1 Container at 01/10/16 [phone]  . lisinopril (PRINIVIL,ZESTRIL) tablet 20 mg  20 mg Oral Daily Lavon Paganini Angiulli, PA-C   20 mg at 01/10/16 0915  . magnesium oxide (MAG-OX) tablet 400 mg  400 mg Oral Daily Lavon Paganini Angiulli, PA-C   400 mg at 01/10/16 0915  . megestrol (MEGACE) 400 MG/10ML suspension 400 mg  400 mg Oral BID Meredith Staggers, MD   400 mg at 01/10/16 0916  .  MUSCLE RUB CREA   Topical TID Meredith Staggers, MD      . omega-3 acid ethyl esters (LOVAZA) capsule 1 g  1 g Oral Daily Lavon Paganini Angiulli, PA-C   1 g at 01/10/16 0916  . ondansetron (ZOFRAN) tablet 4 mg  4 mg Oral Q6H PRN Lavon Paganini Angiulli, PA-C       Or  . ondansetron (ZOFRAN) injection 4 mg  4 mg Intravenous Q6H PRN Daniel J Angiulli, PA-C      . oxyCODONE-acetaminophen (PERCOCET/ROXICET) 5-325 MG per tablet 1 tablet  1 tablet Oral Q4H PRN Meredith Staggers, MD   1 tablet at 01/09/16 0519  . pantoprazole (PROTONIX) EC tablet 40 mg  40 mg Oral QHS Myrene Galas, RPH   40 mg at 01/09/16 2136  . polyethylene glycol (MIRALAX / GLYCOLAX) packet 17 g  17 g Oral Daily PRN Lavon Paganini Angiulli, PA-C   17 g at 12/30/15 0807  . potassium chloride SA (K-DUR,KLOR-CON) CR tablet 20 mEq  20 mEq Oral Daily Meredith Staggers, MD   20 mEq at 01/10/16 0914  . predniSONE (DELTASONE) tablet 10 mg  10 mg Oral BID WC Daniel J Angiulli, PA-C   10 mg at 01/10/16 0915  . pregabalin (LYRICA) capsule 25 mg  25 mg Oral TID Meredith Staggers, MD   25 mg at 01/10/16 0914  . senna-docusate (Senokot-S) tablet 2 tablet  2 tablet Oral Q0600 Meredith Staggers, MD   2 tablet at 01/10/16 0550  . sorbitol 70 % solution 30 mL  30 mL Oral Daily PRN Lavon Paganini Angiulli, PA-C      . vitamin B-12 (CYANOCOBALAMIN) tablet 1,000 mcg  1,000 mcg Oral Daily Lavon Paganini Angiulli, PA-C   1,000 mcg at 01/10/16 [id number]     Discharge Medications: Please see discharge summary for a list of discharge medications.  Relevant Imaging Results:  Relevant Lab Results:   Additional Information SS#: [id number]  Lennart Pall, LCSW

## 2016-01-10 NOTE — Progress Notes (Signed)
Speech Language Pathology Daily Session Note  Patient Details  Name: [name] MRN: [id number] Date of Birth: 07/02/1946  Today's Date: 01/10/2016 SLP Individual Time: 1330-1430 SLP Individual Time Calculation (min): 60 min  Short Term Goals: Week 3: SLP Short Term Goal 1 (Week 3): Pt will consume puree consistencies with minimal s/s of aspiration/dysphagia with Min A for use of compensatory strategies.  SLP Short Term Goal 2 (Week 3): Pt will consume trials of dys 2 without s/s of aspiration/dysphagia with Min A for use of compensatory strategies.  SLP Short Term Goal 3 (Week 3): Pt will complete money management tasks with 90% accuracy and min A cues.  SLP Short Term Goal 4 (Week 3): Pt to demonstrate adequate vocal intensity at the word level with mod A.  SLP Short Term Goal 4 - Progress (Week 3): Met SLP Short Term Goal 5 (Week 3): Pt to demonstrate adequate vocal intensity at the converation level with environmental distractions wtih Supervision level verbal cues.     Skilled Therapeutic Interventions: Skilled treatment session focused on dysphagia and speech goals. SLP facilitated session by providing skilled observation with thin liquids via straw. Patient consumed trials with subtle throat clearing and required Min A verbal cues for use of 4-5 swallows. Patient declined trials due to being full from lunch. SLP also facilitated session by providing supervision verbal cues for patient to achieve 100% intelligibility at the conversation level with Moderate environmental distractions. Patient transferred back to bed at end of session via the MaxiMove with +2 for safety. Patient left with NT. Continue with current plan of care.    Function:  Eating Eating   Modified Consistency Diet: Yes Eating Assist Level: Helper feeds patient  Supervision cues         Cognition Comprehension Comprehension assist level: Follows complex conversation/direction with no assist   Expression   Expression assist level: Expresses complex 90% of the time/cues < 10% of the time  Social Interaction Social Interaction assist level: Interacts appropriately with others - No medications needed.  Problem Solving Problem solving assist level: Solves basic 90% of the time/requires cueing < 10% of the time  Memory Memory assist level: Recognizes or recalls 75 - 89% of the time/requires cueing 10 - 24% of the time    Pain Headache, Patient reported she was premedicated   Therapy/Group: Individual Therapy  PAYNE, COURTNEY 01/10/2016, 3:28 PM   

## 2016-01-10 NOTE — Progress Notes (Signed)
Physical Therapy Session Note  Patient Details  Name: [name] MRN: [id number] Date of Birth: 1946/02/28  Today's Date: 01/10/2016 PT Individual Time: 0920-1015 PT Individual Time Calculation (min): 55 min   Short Term Goals: Week 3:  PT Short Term Goal 1 (Week 3): Pt will be able to demonstrate sitting balance with max assist during functional task EOM PT Short Term Goal 2 (Week 3): Pt will be able to perform basic transfers with max assist  Skilled Therapeutic Interventions/Progress Updates:    Session started late due to patient taking medication via applesauce with RN. Focused on use of tilt table for neuro re-ed for weightbearing through BLE, postural control retraining, and upright tolerance while monitoring BP. Used maxi move for transfers on/off tilt table with pt activating trunk musculature. See values below. Pt with reports of "tiredness" during activity but not dizziness despite dropping BP values. HR increased at end of session and ultimately ended activity. Pt reports feeling like she may have had a BM at end of session - NT notified.    Supine - 109/71 mmHg; HR = 78 bpm 20 degrees x 2 min total - 86/57 mmHg; HR 81 bpm 20 degrees x 6 min total - 86/55 mmHg; HR 84 bpm 10 degrees x 10 min total - 97/54 mmHg; HR 76 bpm 15 degrees x 12 min total - 86/51 mmHg; HR 74 bpm 15 degrees x 15 min total - 85/54 mmHg; HR 74 bpm 15 degrees x 20 min total - 94/73 mmHg; HR = 91 bpm 15 degrees x 22 min total - 96/69 mmHg; HR = 128 bpm    Therapy Documentation Precautions:  Precautions Precautions: Fall, Cervical Required Braces or Orthoses: Cervical Brace Cervical Brace: Hard collar, At all times Restrictions Weight Bearing Restrictions: No   Pain: C/o neck pain - RN administered medication.  See Function Navigator for Current Functional Status.   Therapy/Group: Individual Therapy  Canary Brim Ivory Broad, PT, DPT  01/10/2016, 10:35 AM

## 2016-01-10 NOTE — Progress Notes (Signed)
Orthopedic Tech Progress Note Patient Details:  [name] 04-23-1946 [id number]  Ortho Devices Type of Ortho Device: Abdominal binder Ortho Device/Splint Location: abdomen Ortho Device/Splint Interventions: Loanne Drilling, Rosie Golson 01/10/2016, 11:53 AM

## 2016-01-10 NOTE — Progress Notes (Signed)
Physical Therapy Weekly Progress Note  Patient Details  Name: [name] MRN: [id number] Date of Birth: [birth year]  Beginning of progress report period: January 05, 2016 End of progress report period: January 10, 2016  Patient has met 0 of 2 short term goals.  Pt continues to make minimal functional gains due to pain and slow motor recovery. Pt willing to try all activities but has limited tolerance. Have been working on use of tilt table for upright tolerance and weightbearing through BLE but pt limited up to 20 degrees before orthostasis occurs. D/c plan is SNF due to sister unable to provide this level of care.   Patient continues to demonstrate the following deficits: quadriparesis, decreased balance, decreased postural control, impaired sensation, decreased functional mobility, pain and therefore will continue to benefit from skilled PT intervention to enhance overall performance with activity tolerance, balance, postural control, ability to compensate for deficits, functional use of  right upper extremity, right lower extremity, left upper extremity and left lower extremity and coordination.  Patient not progressing toward long term goals.  See goal revision..  Plan of care revisions: downgraded goals to overall max assist w/c level..  PT Short Term Goals Week 2:  PT Short Term Goal 1 (Week 2): Pt will be able to tolerate OOB x 2 hours at a time in w/c PT Short Term Goal 1 - Progress (Week 2): Met PT Short Term Goal 2 (Week 2): Pt will be able to demonstrate sitting balance with max assist during functional task EOM PT Short Term Goal 2 - Progress (Week 2): Not met PT Short Term Goal 3 (Week 2): Pt will be able to perform transfers with max assist PT Short Term Goal 3 - Progress (Week 2): Not met Week 3:  PT Short Term Goal 1 (Week 3): Pt will be able to demonstrate sitting balance with max assist during functional task EOM PT Short Term Goal 1 - Progress (Week 3): Not met PT  Short Term Goal 2 (Week 3): Pt will be able to perform basic transfers with max assist PT Short Term Goal 2 - Progress (Week 3): Not met Week 4:  PT Short Term Goal 1 (Week 4): = LTGs due to d/c to SNF (overall max assist)  Skilled Therapeutic Interventions/Progress Updates:  Balance/vestibular training;Community reintegration;DME/adaptive equipment instruction;Functional electrical stimulation;Functional mobility training;Patient/family education;Neuromuscular re-education;Pain management;Psychosocial support;UE/LE Strength taining/ROM;Wheelchair propulsion/positioning;Therapeutic Activities;UE/LE Coordination activities;Therapeutic Exercise;Splinting/orthotics;Cognitive remediation/compensation;Discharge planning;Disease management/prevention;Ambulation/gait training;Skin care/wound management   Therapy Documentation Precautions:  Precautions Precautions: Fall, Cervical Required Braces or Orthoses: Cervical Brace Cervical Brace: Hard collar, At all times Restrictions Weight Bearing Restrictions: No     See Function Navigator for Current Functional Status.  Canary Brim Ivory Broad, PT, DPT  01/10/2016, 10:04 AM

## 2016-01-10 NOTE — Progress Notes (Signed)
Subjective/Complaints: Perhaps slept better last night. Neck sore  Review of systems denies any breathing problems no chest pain no shortness of breath, negative for nausea, vomiting, diarrhea  Objective: Vital Signs: Blood pressure 109/68, pulse 75, temperature 98.6 F (37 C), temperature source Oral, resp. rate 18, height 5\' 9"  (1.753 m), weight 61.689 kg (136 lb), last menstrual period 03/31/2004, SpO2 100 %. No results found. No results found for this or any previous visit (from the past 72 hour(s)).   General: No acute distress. Vital signs reviewed. Psych: Mood is up beat. More alert today.  Heart: Regular rate and rhythm no rubs murmurs or extra sounds Lungs: Clear to auscultation, breathing unlabored, no rales or wheezes Abdomen: Positive bowel sounds, soft nontender to palpation, nondistended Skin: No evidence of breakdown, no evidence of rash Neurologic: speech clear. Very alert Motor strength:  Right upper extremity: 1+ to 2- delt/bic trace to 1 tricep,wrist, 0 HI Left upper extremity: 1+ bicep,deltoid, trace wrist/tricep, 0 HI Right lower extremity: 2+/5 proximal distal Left lower extremity: 1/5 proximal to distal Sensory exam absent in bilateral lower extremities to light touch Musculoskeletal: No edema. No tenderness.  Assessment/Plan: 1. 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 Physiatrist is providing close team supervision and 24 hour management of active medical problems listed below. Physiatrist and rehab team continue to assess barriers to discharge/monitor patient progress toward functional and medical goals. FIM: Function - Bathing Bathing activity did not occur: N/A (Night bath) Position: Bed Body parts bathed by patient: Right arm, Left arm, Chest, Abdomen (Hand over hand assist) Body parts bathed by helper: Right arm,  Left arm, Chest, Abdomen, Front perineal area, Buttocks, Right upper leg, Left upper leg, Right lower leg, Left lower leg, Back Bathing not applicable: Right upper leg, Left upper leg, Right lower leg, Left lower leg, Back Assist Level: Touching or steadying assistance(Pt > 75%) (hand over hand assist using bath mit)  Function- Upper Body Dressing/Undressing What is the patient wearing?: Hospital gown Pull over shirt/dress - Perfomed by patient: Thread/unthread right sleeve, Thread/unthread left sleeve Pull over shirt/dress - Perfomed by helper: Put head through opening, Pull shirt over trunk, Thread/unthread left sleeve, Thread/unthread right sleeve Button up shirt - Perfomed by helper: Thread/unthread right sleeve, Thread/unthread left sleeve, Pull shirt around back, Button/unbutton shirt (Jacket) Assist Level: 2 helpers Function - Lower Body Dressing/Undressing What is the patient wearing?: Hospital Gown Position: Bed Pants- Performed by helper: Thread/unthread right pants leg, Thread/unthread left pants leg, Pull pants up/down Non-skid slipper socks- Performed by helper: Don/doff right sock, Don/doff left sock TED Hose - Performed by helper: Don/doff right TED hose, Don/doff left TED hose Assist for footwear: Dependant Assist for lower body dressing: 2 Helpers  Function - Toileting Toileting activity did not occur: No continent bowel/bladder event Toileting steps completed by patient: Adjust clothing prior to toileting, Performs perineal hygiene, Adjust clothing after toileting (per Berkley Harvey, Nt report) Assist level: Two helpers (Per [place], NT report)  Function - Air cabin crew transfer activity did not occur: Safety/medical concerns  Function - Chair/bed transfer Chair/bed transfer method: Lateral scoot Chair/bed transfer assist level: Total assist (Pt < 25%) Chair/bed transfer assistive device: Armrests, Sliding board Chair/bed transfer details: Tactile cues for  posture, Tactile cues for placement, Manual facilitation for weight shifting, Manual facilitation for placement, Verbal cues for safe use of DME/AE, Verbal cues for precautions/safety, Verbal cues for  technique, Manual facilitation for weight bearing  Function - Locomotion: Wheelchair Will patient use wheelchair at discharge?: Yes Type: Manual Wheelchair activity did not occur: Safety/medical concerns Max wheelchair distance: 89ft Assist Level: 2 helpers, Maximal assistance (Pt 25 - 49%) Wheel 50 feet with 2 turns activity did not occur: Safety/medical concerns Assist Level: 2 helpers, Maximal assistance (Pt 25 - 49%) Wheel 150 feet activity did not occur: Safety/medical concerns Assist Level: Dependent (Pt equals 0%) Turns around,maneuvers to table,bed, and toilet,negotiates 3% grade,maneuvers on rugs and over doorsills: No Function - Locomotion: Ambulation Ambulation activity did not occur: Safety/medical concerns Walk 10 feet activity did not occur: Safety/medical concerns Walk 50 feet with 2 turns activity did not occur: Safety/medical concerns Walk 150 feet activity did not occur: Safety/medical concerns Walk 10 feet on uneven surfaces activity did not occur: Safety/medical concerns  Function - Comprehension Comprehension: Auditory Comprehension assist level: Follows complex conversation/direction with no assist  Function - Expression Expression: Verbal Expression assist level: Expresses complex ideas: With no assist  Function - Social Interaction Social Interaction assist level: Interacts appropriately with others - No medications needed.  Function - Problem Solving Problem solving assist level: Solves complex problems: Recognizes & self-corrects  Function - Memory Memory assist level: Complete Independence: No helper Patient normally able to recall (first 3 days only): Current season  Medical Problem List and Plan: 1.  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 Cervical collar at all times   - Continue CIR.    -SNF pending 2.  DVT Prophylaxis/Anticoagulation: dopplers negative  -changed to sq lovenox  -Vascular ultrasound negative for lower extremity DVT on 5/31 3. Pain Management/fibromyalgia/chronic back pain: Chronic prednisone 10 mg twice a day,  Oxycodone and Zanaflex as needed,    -Lyrica increased back to 50mg  tid and observe for lethargy  -robaxin stopped    -added sports cream  scheduled. 4. Mood: Xanax 0.5 mg twice a day 5. Neuropsych: This patient is capable of making decisions on her own behalf. 6. Skin/Wound Care: Routine skin checks 7. Fluids/Electrolytes/Nutrition: intake improving with megace  -D1 Thin diet   -  Repleted potassium 8. Hypertension. Hydrochlorothiazide 25 mg daily, lisinopril 20 mg daily. Fair control 9. Constipation. Laxative assistance 10. Hyperlipidemia.Lovaza 11. Neurogenic bladder/bowels  continue ICP program  -cath volumes 300-600 generally  -targeting a PM bowel program 12. Hypoalbuminemia- eating much better 13. Escherichia coli UTI  -keflex completed.     LOS (Days) 20 A FACE TO FACE EVALUATION WAS PERFORMED  SWARTZ,ZACHARY T 01/10/2016, 10:25 AM

## 2016-01-10 NOTE — Progress Notes (Signed)
Occupational Therapy Session Note  Patient Details  Name: [name] MRN: [id number] Date of Birth: 10-Jul-1946  Today's Date: 01/10/2016 OT Individual Time: 1130-1200 OT Individual Time Calculation (min): 30 min    Short Term Goals: Week 1:  OT Short Term Goal 1 (Week 1): Pt will be able to maintain sitting balance EOB with min A. OT Short Term Goal 1 - Progress (Week 1): Not met OT Short Term Goal 2 (Week 1): Pt will be able to don a shirt with mod A. OT Short Term Goal 2 - Progress (Week 1): Not met OT Short Term Goal 3 (Week 1): Pt will have improved grasp strength to hold washcloth to wash UB with mod A. OT Short Term Goal 3 - Progress (Week 1): Not met OT Short Term Goal 4 (Week 1): Pt will be able to roll in bed with mod A to assist caregivers with LB dressing. OT Short Term Goal 4 - Progress (Week 1): Partly met Week 2:  OT Short Term Goal 1 (Week 2): Pt will maintain static sitting balance seated EOM with close supervision in prep for functional task OT Short Term Goal 1 - Progress (Week 2): Not met OT Short Term Goal 2 (Week 2): Pt will don shirt with mod A with set-up OT Short Term Goal 2 - Progress (Week 2): Not met OT Short Term Goal 3 (Week 2): Pt will direct caregiver with bed mobility with 1 VCs in prep for functional rolling. OT Short Term Goal 3 - Progress (Week 2): Met Week 3:  OT Short Term Goal 1 (Week 3): Pt will complete oral care with min A using universal cuff  OT Short Term Goal 2 (Week 3): Pt wil direct caregiver with upper body dressing with Mod I  OT Short Term Goal 3 (Week 3): Pt will tolerate sitting EOB/EOM for 5 minutes to increase functional activity tolerance  Skilled Therapeutic Interventions/Progress Updates:    Pt seen for skilled OT to facilitate BUE AROM.  Initially pt stated she has slipped down in w/c. To adjust her hips, pt was assisted with leaning forward over therapist as pt moved back with reciprocal hips as therapist alternated  lifting each thigh. Pt tolerated forward lean. Pt adjusted back in chair and worked on A/AROM of shoulders, elbows and tip pinch on R hand to facilitate movements needed for self feeding. Pt participated well. Education with her niece in the room.   Therapy Documentation Precautions:  Precautions Precautions: Fall, Cervical Required Braces or Orthoses: Cervical Brace Cervical Brace: Hard collar, At all times Restrictions Weight Bearing Restrictions: No   Pain: no pain at rest, pt c/o headache, she stated the RN was aware   ADL:  See Function Navigator for Current Functional Status.   Therapy/Group: Individual Therapy  Osaze Hubbert 01/10/2016, 12:49 PM

## 2016-01-10 NOTE — Progress Notes (Signed)
Occupational Therapy Session Note  Patient Details  Name: [name] MRN: [id number] Date of Birth: 1946-01-05  Today's Date: 01/10/2016 OT Individual Time: [id number] OT Individual Time Calculation (min): 60 min    Short Term Goals: Week 3:  OT Short Term Goal 1 (Week 3): Pt will complete oral care with min A using universal cuff  OT Short Term Goal 2 (Week 3): Pt wil direct caregiver with upper body dressing with Mod I  OT Short Term Goal 3 (Week 3): Pt will tolerate sitting EOB/EOM for 5 minutes to increase functional activity tolerance  Skilled Therapeutic Interventions/Progress Updates:    Pt seen for OT session focusing on self feeding. Pt in supine upon arrival, voicing complaints of headache, however, not desiring to make RN aware for medication, wanting to cont with therapy. Pants donned total A in supine. She demonstrated improved ability to roll this session compared to previous ones, able to roll completely to side with min- mod A. Total A to transfer to EOB and total A +1 sliding board transfer to w/c with +2 to steady equipment. Pt taken to therapy day room to utiliize high/low table to eat breakfast. Universal dorsal wrist cuff plied to R hand and pt able to self feed with steadying assist at wrist and elbow with assist to scoop. Total A required for managing cup. VCs provided throughout to maintain swallowing precautions.  Pt returned to room. PRN muscle rub applied to pt's shoulders for comfort. Left in w/c positioned with pillows for comfort and family members present.    Therapy Documentation Precautions:  Precautions Precautions: Fall, Cervical Required Braces or Orthoses: Cervical Brace Cervical Brace: Hard collar, At all times Restrictions Weight Bearing Restrictions: No Pain: Pain Assessment Pain Score: 9  Pain Type: Acute pain Pain Location: Head Pain Descriptors / Indicators: Aching Pain Intervention(s): Repositioned;Ambulation/increased  activity  See Function Navigator for Current Functional Status.   Therapy/Group: Individual Therapy  Lewis, Noelle Hoogland C 01/10/2016, 7:05 AM

## 2016-01-11 ENCOUNTER — Inpatient Hospital Stay (HOSPITAL_COMMUNITY): Payer: Medicare Other

## 2016-01-11 ENCOUNTER — Inpatient Hospital Stay (HOSPITAL_COMMUNITY): Payer: Medicare Other | Admitting: Occupational Therapy

## 2016-01-11 ENCOUNTER — Inpatient Hospital Stay (HOSPITAL_COMMUNITY): Payer: Medicare Other | Admitting: Speech Pathology

## 2016-01-11 LAB — CBC
HEMATOCRIT: 30 % — AB (ref 36.0–46.0)
HEMOGLOBIN: 9.7 g/dL — AB (ref 12.0–15.0)
MCH: 30.3 pg (ref 26.0–34.0)
MCHC: 32.3 g/dL (ref 30.0–36.0)
MCV: 93.8 fL (ref 78.0–100.0)
Platelets: 298 10*3/uL (ref 150–400)
RBC: 3.2 MIL/uL — AB (ref 3.87–5.11)
RDW: 13.7 % (ref 11.5–15.5)
WBC: 9.8 10*3/uL (ref 4.0–10.5)

## 2016-01-11 LAB — BASIC METABOLIC PANEL
ANION GAP: 5 (ref 5–15)
BUN: 17 mg/dL (ref 6–20)
CALCIUM: 9 mg/dL (ref 8.9–10.3)
CHLORIDE: 105 mmol/L (ref 101–111)
CO2: 23 mmol/L (ref 22–32)
Creatinine, Ser: 0.59 mg/dL (ref 0.44–1.00)
GFR calc non Af Amer: >60 mL/min (ref >60)
GLUCOSE: 111 mg/dL — AB (ref 65–99)
POTASSIUM: 4.1 mmol/L (ref 3.5–5.1)
Sodium: 133 mmol/L — ABNORMAL LOW (ref 135–145)

## 2016-01-11 MED ORDER — PREGABALIN 50 MG PO CAPS
50.0000 mg | ORAL_CAPSULE | Freq: Three times a day (TID) | ORAL | Status: DC
  Administered 2016-01-11 – 2016-01-15 (×11): 50 mg via ORAL
  Filled 2016-01-11 (×12): qty 1

## 2016-01-11 MED ORDER — IBUPROFEN 400 MG PO TABS
400.0000 mg | ORAL_TABLET | Freq: Two times a day (BID) | ORAL | Status: DC
  Administered 2016-01-11 – 2016-01-14 (×8): 400 mg via ORAL
  Filled 2016-01-11 (×9): qty 1

## 2016-01-11 NOTE — Progress Notes (Signed)
Speech Language Pathology Weekly Progress and Session Note  Patient Details  Name: [name] MRN: [id number] Date of Birth: 1946-05-03  Beginning of progress report period: January 04, 2016 End of progress report period: January 11, 2016  Today's Date: 01/11/2016 SLP Individual Time: 1300-1400 SLP Individual Time Calculation (min): 60 min  Short Term Goals: Week 3: SLP Short Term Goal 1 (Week 3): Pt will consume puree consistencies with minimal s/s of aspiration/dysphagia with Min A for use of compensatory strategies.  SLP Short Term Goal 1 - Progress (Week 3): Met SLP Short Term Goal 2 (Week 3): Pt will consume trials of dys 2 without s/s of aspiration/dysphagia with Min A for use of compensatory strategies.  SLP Short Term Goal 2 - Progress (Week 3): Not met SLP Short Term Goal 3 (Week 3): Pt will complete money management tasks with 90% accuracy and min A cues.  SLP Short Term Goal 3 - Progress (Week 3): Not met SLP Short Term Goal 4 (Week 3): Pt to demonstrate adequate vocal intensity at the word level with mod A.  SLP Short Term Goal 4 - Progress (Week 3): Met SLP Short Term Goal 5 (Week 3): Pt to demonstrate adequate vocal intensity at the converation level with environmental distractions wtih Supervision level verbal cues.    SLP Short Term Goal 5 - Progress (Week 3): Met    New Short Term Goals: Week 4: SLP Short Term Goal 1 (Week 4): Pt will consume trials of dys 2 without s/s of aspiration/dysphagia with Min A for use of compensatory strategies.  SLP Short Term Goal 2 (Week 4): Pt will complete money management tasks with 90% accuracy and min A cues.  SLP Short Term Goal 3 (Week 4): Pt to demonstrate adequate vocal intensity at the converation level with environmental distractions wtih Mod I.    SLP Short Term Goal 4 (Week 4): Pt will consume puree consistencies with minimal s/s of aspiration/dysphagia with supervision for use of compensatory strategies.  SLP Short Term  Goal 5 (Week 4): Pt will consume thin liquids via straw with minimal s/s of aspiration/dysphagia with supervision for use of compensatory strategies.   Weekly Progress Updates: Patient has made functional gains and has met 3 of 5 STG's this reporting period due to improved swallowing and speech function. Currently, patient is consuming Dys. 1 textures with thin liquids via straw with intermittent overt s/s of aspiration and requires Min A verbal cues for use of swallowing compensatory strategies. Patient is also ~90-100% intelligible at the conversation level with supervision verbal cues due to improved vocal intensity. However, patient also continues to require overall Min-Mod A to complete basic mathematical problems and short-term recall of functional information. Patient and family education is ongoing. Patient would benefit from continued skilled SLP intervention to maximize her cognitive and swallowing function and functional communication prior to discharge.     Intensity: Minumum of 1-2 x/day, 30 to 90 minutes Frequency: 3 to 5 out of 7 days Duration/Length of Stay: TBD due to SNF placement  Treatment/Interventions: Cognitive remediation/compensation;Dysphagia/aspiration precaution training;Cueing hierarchy;Functional tasks;Therapeutic Activities;Medication managment;Patient/family education;Internal/external aids;Speech/Language facilitation;Environmental controls   Daily Session  Skilled Therapeutic Interventions: Skilled treatment session focused on dysphagia goals. SLP facilitated session by feeding the patient her current diet of Dys. 1 textures with thin liquids via straw and providing Min A verbal cues for use of swallowing compensatory strategies. Patient demonstrated intermittent, subtle throat clearing and coughing with thin liquids via straw, suspect due to large sips with  decreased use of multiple swallows. Patient's sister present nad provided encouragement. Patient left upright in  wheelchair with call bell in place and sister present. Continue with current plan of care.       Function:   Eating Eating   Modified Consistency Diet: Yes Eating Assist Level: Helper feeds patient     Helper Scoops Food on Utensil: Every scoop Helper Brings Food to Mouth: Every scoop   Cognition Comprehension Comprehension assist level: Follows basic conversation/direction with no assist  Expression   Expression assist level: Expresses basic needs/ideas: With no assist  Social Interaction Social Interaction assist level: Interacts appropriately 90% of the time - Needs monitoring or encouragement for participation or interaction.  Problem Solving Problem solving assist level: Solves basic 90% of the time/requires cueing < 10% of the time  Memory Memory assist level: Recognizes or recalls 90% of the time/requires cueing < 10% of the time   Pain Pain Assessment Pain Score: 2   Therapy/Group: Individual Therapy  [name] 01/11/2016, 4:51 PM

## 2016-01-11 NOTE — Progress Notes (Signed)
Physical Therapy Session Note  Patient Details  Name: [name] MRN: [id number] Date of Birth: 08-21-1945  Today's Date: 01/11/2016 PT Individual Time: 1100-1200 PT Individual Time Calculation (min): 60 min    Skilled Therapeutic Interventions/Progress Updates:   Focused on functional weightshifts to don and remove abdominal binder and sling for maxi move transfers w/c to cardiac tilt table. Neuro re-ed on cardiac tilt table for weightbearing through BLE, upright tolerance, and postural control. See vitals below and monitored throughout. Pt engaged in LE strengthening exercises while on table for knee flexion (active assisted on LLE). Pt not symptomatic during activity. Abdominal binder seemed to benefit patient during tilt table trials.   101/60 mmHg; HR = 75 bpm supine 92/62 mmHg; HR = 85 bpm (10 degrees after 2 min) 101/59 mmHg; HR = 78 bpm (10 degrees after 7 min total)  89/59 mmHg; HR = 83 bpm (15 degrees x 2 min) 91/61 mmHg; HR = 78 bpm (15 degrees x 7 min total) 87/68 mmHg; HR = 86 bpm (15 degrees x 9 min total)    Therapy Documentation Precautions:  Precautions Precautions: Fall, Cervical Required Braces or Orthoses: Cervical Brace Cervical Brace: Hard collar, At all times Restrictions Weight Bearing Restrictions: No  Pain:  Premedicated for neck and shoulder pain.   See Function Navigator for Current Functional Status.   Therapy/Group: Individual Therapy  Juanna Cao, PT, DPT  01/11/2016, 12:01 PM

## 2016-01-11 NOTE — Progress Notes (Signed)
Physical Therapy Session Note  Patient Details  Name: TERRICA DACE MRN: [id number] Date of Birth: 21-Jun-1946  Today's Date: 01/11/2016 PT Individual Time: 1400-1430 PT Individual Time Calculation (min): 30 min   Short Term Goals: Week 4:  PT Short Term Goal 1 (Week 4): = LTGs due to d/c to SNF (overall max assist)  Skilled Therapeutic Interventions/Progress Updates:   Session focused on seated UE strengthening, stretching, and AAROM in all planes of motion to facilitate normalized movement patterns and relieve pain. Max assist for repositioning in the w/c and removal of maxi move sling.   Therapy Documentation Precautions:  Precautions Precautions: Fall, Cervical Required Braces or Orthoses: Cervical Brace Cervical Brace: Hard collar, At all times Restrictions Weight Bearing Restrictions: No   Pain: Neck and shoulder pain alleviated with stretching.    See Function Navigator for Current Functional Status.   Therapy/Group: Individual Therapy  Canary Brim Ivory Broad, PT, DPT  01/11/2016, 3:32 PM

## 2016-01-11 NOTE — Progress Notes (Addendum)
Subjective/Complaints: Still having headaches. States that ibuprofen helps with headaches at home.   Review of systems denies any breathing problems no chest pain no shortness of breath, negative for nausea, vomiting, diarrhea  Objective: Vital Signs: Blood pressure 98/70, pulse 106, temperature 98.2 F (36.8 C), temperature source Oral, resp. rate 18, height 5\' 9"  (1.753 m), weight 61.689 kg (136 lb), last menstrual period 03/31/2004, SpO2 99 %. No results found. No results found for this or any previous visit (from the past 72 hour(s)).   General: No acute distress. Vital signs reviewed. Psych: Mood is up beat. More alert today.  Heart: Regular rate and rhythm no rubs murmurs or extra sounds Lungs: Clear to auscultation, breathing unlabored, no rales or wheezes Abdomen: Positive bowel sounds, soft nontender to palpation, nondistended Skin: No evidence of breakdown, no evidence of rash Neurologic: speech clear. Very alert Motor strength:  Right upper extremity: 1+ to 2- delt/bic trace to 1 tricep,wrist, 0 HI Left upper extremity: 1+ bicep,deltoid, trace wrist/tricep, 0 HI Right lower extremity: 2+/5 proximal distal Left lower extremity: 1/5 proximal to distal Sensory exam absent in bilateral lower extremities to light touch Musculoskeletal: No edema. No tenderness.  Assessment/Plan: 1. 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 Physiatrist is providing close team supervision and 24 hour management of active medical problems listed below. Physiatrist and rehab team continue to assess barriers to discharge/monitor patient progress toward functional and medical goals. FIM: Function - Bathing Bathing activity did not occur: N/A (Night bath) Position: Bed Body parts bathed by patient: Right arm, Left arm, Chest, Abdomen (Hand over hand assist) Body  parts bathed by helper: Right arm, Left arm, Chest, Abdomen, Front perineal area, Buttocks, Right upper leg, Left upper leg, Right lower leg, Left lower leg, Back Bathing not applicable: Right upper leg, Left upper leg, Right lower leg, Left lower leg, Back Assist Level: Touching or steadying assistance(Pt > 75%) (hand over hand assist using bath mit)  Function- Upper Body Dressing/Undressing What is the patient wearing?: Hospital gown Pull over shirt/dress - Perfomed by patient: Thread/unthread right sleeve, Thread/unthread left sleeve Pull over shirt/dress - Perfomed by helper: Put head through opening, Pull shirt over trunk, Thread/unthread left sleeve, Thread/unthread right sleeve Button up shirt - Perfomed by helper: Thread/unthread right sleeve, Thread/unthread left sleeve, Pull shirt around back, Button/unbutton shirt (Jacket) Assist Level: 2 helpers Function - Lower Body Dressing/Undressing What is the patient wearing?: Hospital Gown Position: Bed Pants- Performed by helper: Thread/unthread right pants leg, Thread/unthread left pants leg, Pull pants up/down Non-skid slipper socks- Performed by helper: Don/doff right sock, Don/doff left sock TED Hose - Performed by helper: Don/doff right TED hose, Don/doff left TED hose Assist for footwear: Dependant Assist for lower body dressing: 2 Helpers  Function - Toileting Toileting activity did not occur: No continent bowel/bladder event Toileting steps completed by patient: Adjust clothing prior to toileting, Performs perineal hygiene, Adjust clothing after toileting (per Berkley Harvey, Nt report) Assist level: Two helpers (Per [place], NT report)  Function - Air cabin crew transfer activity did not occur: Safety/medical concerns  Function - Chair/bed transfer Chair/bed transfer method: Lateral scoot Chair/bed transfer assist level: Total assist (Pt < 25%) Chair/bed transfer assistive device: Armrests, Sliding board Chair/bed  transfer details: Tactile cues for posture, Tactile cues for placement, Manual facilitation for weight shifting, Manual facilitation for placement, Verbal cues for safe use of DME/AE, Verbal cues  for precautions/safety, Verbal cues for technique, Manual facilitation for weight bearing  Function - Locomotion: Wheelchair Will patient use wheelchair at discharge?: Yes Type: Manual Wheelchair activity did not occur: Safety/medical concerns Max wheelchair distance: 7ft Assist Level: Dependent (Pt equals 0%) Wheel 50 feet with 2 turns activity did not occur: Safety/medical concerns Assist Level: Dependent (Pt equals 0%) Wheel 150 feet activity did not occur: Safety/medical concerns Assist Level: Dependent (Pt equals 0%) Turns around,maneuvers to table,bed, and toilet,negotiates 3% grade,maneuvers on rugs and over doorsills: No Function - Locomotion: Ambulation Ambulation activity did not occur: Safety/medical concerns Walk 10 feet activity did not occur: Safety/medical concerns Walk 50 feet with 2 turns activity did not occur: Safety/medical concerns Walk 150 feet activity did not occur: Safety/medical concerns Walk 10 feet on uneven surfaces activity did not occur: Safety/medical concerns  Function - Comprehension Comprehension: Auditory Comprehension assist level: Follows complex conversation/direction with no assist  Function - Expression Expression: Verbal Expression assist level: Expresses complex ideas: With no assist  Function - Social Interaction Social Interaction assist level: Interacts appropriately with others - No medications needed.  Function - Problem Solving Problem solving assist level: Solves complex problems: Recognizes & self-corrects  Function - Memory Memory assist level: Complete Independence: No helper Patient normally able to recall (first 3 days only): Current season  Medical Problem List and Plan: 1.  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 Cervical collar at all times   - Continue CIR.    -SNF pending 2.  DVT Prophylaxis/Anticoagulation: dopplers negative  -changed to sq lovenox  -Vascular ultrasound negative for lower extremity DVT on 5/31 3. Pain Management/fibromyalgia/chronic back pain: Chronic prednisone 10 mg twice a day,  Oxycodone and Zanaflex as needed,    -Lyrica increased back to 50mg  tid. observe for lethargy  -robaxin stopped    -dc sports cream (ineffective)  -will add low dose ibuprofen 400mg  bid for a few days 4. Mood: Xanax 0.5 mg twice a day 5. Neuropsych: This patient is capable of making decisions on her own behalf. 6. Skin/Wound Care: Routine skin checks 7. Fluids/Electrolytes/Nutrition: intake improving with megace but inconsistent  -D1 Thin diet   -  Repleted potassium  -recheck labs today 8. Hypertension. Hydrochlorothiazide 25 mg daily, lisinopril 20 mg daily. Fair control 9. Constipation. Laxative assistance 10. Hyperlipidemia.Lovaza 11. Neurogenic bladder/bowels  continue ICP program  -cath volumes 300-600 generally  -targeting a PM bowel program but has been inconsistent 12. Hypoalbuminemia- eating much better 13. Escherichia coli UTI  -keflex completed.     LOS (Days) 21 A FACE TO FACE EVALUATION WAS PERFORMED  [name] T 01/11/2016, 9:08 AM

## 2016-01-11 NOTE — Progress Notes (Signed)
Nutrition Follow-up  DOCUMENTATION CODES:   Non-severe (moderate) malnutrition in context of chronic illness  INTERVENTION:  Continue Boost Breeze po TID, each supplement provides 250 kcal and 9 grams of protein.  Encourage adequate PO intake.   NUTRITION DIAGNOSIS:   Malnutrition related to chronic illness as evidenced by moderate depletions of muscle mass, moderate depletion of body fat; ongoing  GOAL:   Patient will meet greater than or equal to 90% of their needs; progresing  MONITOR:   PO intake, Supplement acceptance, Diet advancement, Weight trends, Labs, I & O's  REASON FOR ASSESSMENT:   Low Braden    ASSESSMENT:   70 y.o. right handed female with history of fibromyalgia, hypertension. Presented 12/18/2015 for numbness and weakness involving both hands and lower extremities that has progressed over the past 6-8 months. She had also been experiencing increased neck pain.  X-rays and imaging revealed cervical spondylosis with follow-up with a C3-C6. Underwent two-stage approach discectomy at C3-4, C4-5 with decompression, placement of intravertebral biomechanical device, [employer] with C3-C6 laminectomy for decompression of spinal cord posterior segmental instrumentation 12/18/2015  Meal completion has been varied from 10-100% with 100% at lunch today. Pt reports having a good appetite and liking her food at meals. Pt has been consuming her Boost Breeze. Pt encouraged to eat her food at meals and to drink her supplements.   Labs and medications reviewed.   Diet Order:  DIET - DYS 1 Room service appropriate?: Yes; Fluid consistency:: Thin  Skin:   (Incision on neck)  Last BM:  6/15  Height:   Ht Readings from Last 1 Encounters:  12/21/15 5\' 9"  (1.753 m)    Weight:   Wt Readings from Last 1 Encounters:  12/29/15 136 lb (61.689 kg)    Ideal Body Weight:  65.9 kg  BMI:  Body mass index is 20.07 kg/(m^2).  Estimated Nutritional Needs:   Kcal:   1650-1850  Protein:  75-85 grams  Fluid:  1.6 - 1.8 L/day  EDUCATION NEEDS:   No education needs identified at this time  Corrin Parker, MS, RD, LDN Pager # [phone] After hours/ weekend pager # [phone]

## 2016-01-11 NOTE — Progress Notes (Signed)
Occupational Therapy Session Note  Patient Details  Name: [name] MRN: [id number] Date of Birth: 1945/09/27  Today's Date: 01/11/2016 OT Individual Time: [id number] OT Individual Time Calculation (min): 60 min    Short Term Goals: Week 3:  OT Short Term Goal 1 (Week 3): Pt will complete oral care with min A using universal cuff  OT Short Term Goal 2 (Week 3): Pt wil direct caregiver with upper body dressing with Mod I  OT Short Term Goal 3 (Week 3): Pt will tolerate sitting EOB/EOM for 5 minutes to increase functional activity tolerance  Skilled Therapeutic Interventions/Progress Updates:    Pt seen for OT session focusing on ADL retraining and UE stretching/ ROM. Pt in supine upon arrival, voicing having not slept well however wanting to get OOB. TED hose,socks, and pants donned total A in supine, rolling with +2 to pull pants up. She transferred to EOB with max A, able to assist with moving R LE off EOB. Sliding board transfer completed with total A with +2 to steady equipment. With increased mobility pt voiced pain "over 100". RN aware and pain medication administered, Muscle Rub donned to B shoulders for MD order. Grooming completed with hand over hand assist to brush hair and wash face. With use of dorsal universal cuff on R hand pt able to brush teeth with steadying assist provided at elbow and wrist. Total A required to lean forward to spit into sink.  She voiced desire to have UE "stretched" due to discomfort in shoulders/ neck. ROM provided at elbow and shoulders. Pt demonstrated ability to flex and extend B elbows against gravity demonstrating improved strength and working towards functional movement/ use of UEs.  Pt left sitting in w/c at end of session, towels and pillows placed for comfort and all needs in reach.   Therapy Documentation Precautions:  Precautions Precautions: Fall, Cervical Required Braces or Orthoses: Cervical Brace Cervical Brace: Hard collar,  At all times Restrictions Weight Bearing Restrictions: No  See Function Navigator for Current Functional Status.   Therapy/Group: Individual Therapy  Lewis, [name] 01/11/2016, 7:09 AM

## 2016-01-12 ENCOUNTER — Inpatient Hospital Stay (HOSPITAL_COMMUNITY): Payer: Medicare Other | Admitting: Speech Pathology

## 2016-01-12 ENCOUNTER — Inpatient Hospital Stay (HOSPITAL_COMMUNITY): Payer: Medicare Other | Admitting: Occupational Therapy

## 2016-01-12 LAB — URINALYSIS, ROUTINE W REFLEX MICROSCOPIC
GLUCOSE, UA: NEGATIVE mg/dL
KETONES UR: NEGATIVE mg/dL
Nitrite: POSITIVE — AB
PH: 6 (ref 5.0–8.0)
Protein, ur: NEGATIVE mg/dL
Specific Gravity, Urine: 1.03 (ref 1.005–1.030)

## 2016-01-12 LAB — URINE MICROSCOPIC-ADD ON

## 2016-01-12 MED ORDER — NITROFURANTOIN MONOHYD MACRO 100 MG PO CAPS
100.0000 mg | ORAL_CAPSULE | Freq: Two times a day (BID) | ORAL | Status: DC
  Administered 2016-01-12 – 2016-01-14 (×6): 100 mg via ORAL
  Filled 2016-01-12 (×8): qty 1

## 2016-01-12 NOTE — Progress Notes (Signed)
Speech Language Pathology Daily Session Note  Patient Details  Name: [name] MRN: [id number] Date of Birth: 1946-03-06  Today's Date: 01/12/2016 SLP Individual Time: 1500-1530 SLP Individual Time Calculation (min): 30 min  Short Term Goals: Week 4: SLP Short Term Goal 1 (Week 4): Pt will consume trials of dys 2 without s/s of aspiration/dysphagia with Min A for use of compensatory strategies.  SLP Short Term Goal 2 (Week 4): Pt will complete money management tasks with 90% accuracy and min A cues.  SLP Short Term Goal 3 (Week 4): Pt to demonstrate adequate vocal intensity at the converation level with environmental distractions wtih Mod I.    SLP Short Term Goal 4 (Week 4): Pt will consume puree consistencies with minimal s/s of aspiration/dysphagia with supervision for use of compensatory strategies.  SLP Short Term Goal 5 (Week 4): Pt will consume thin liquids via straw with minimal s/s of aspiration/dysphagia with supervision for use of compensatory strategies.   Skilled Therapeutic Interventions: Skilled treatment session focused on dysphagia and speech goals. SLP facilitated session by providing supervision cues for adequate vocal intensity at the conversation level with environmental distractions. Pt consumed 2 bolus of graham crackers completely dissolved in puree (no solid consistency to graham crackers per pt request). Pt consumed without s/s of aspiration. With question prompt, pt able to state swallow compensatory strategies and implemented with Min A verbal cues. Pt left in bed with all needs within reach. Continue current plan of care.   Function:  Eating Eating   Modified Consistency Diet: Yes Eating Assist Level: Set up assist for   Eating Set Up Assist For: Opening containers;Cutting food;Applying device (includes dentures) Helper Scoops Food on Utensil: Every scoop Helper Brings Food to Mouth: Every scoop   Cognition Comprehension Comprehension assist  level: Follows basic conversation/direction with no assist  Expression   Expression assist level: Expresses basic needs/ideas: With no assist  Social Interaction Social Interaction assist level: Interacts appropriately 90% of the time - Needs monitoring or encouragement for participation or interaction.  Problem Solving Problem solving assist level: Solves basic 90% of the time/requires cueing < 10% of the time  Memory Memory assist level: Recognizes or recalls 90% of the time/requires cueing < 10% of the time    Pain    Therapy/Group: Individual Therapy  Khalifa Knecht 01/12/2016, 4:14 PM

## 2016-01-12 NOTE — Progress Notes (Signed)
Occupational Therapy Session Note  Patient Details  Name: [name] MRN: [id number] Date of Birth: 1945/10/19  Today's Date: 01/12/2016 OT Individual Time: [id number] OT Individual Time Calculation (min): 45 min    Short Term Goals: Week 3:  OT Short Term Goal 1 (Week 3): Pt will complete oral care with min A using universal cuff  OT Short Term Goal 2 (Week 3): Pt wil direct caregiver with upper body dressing with Mod I  OT Short Term Goal 3 (Week 3): Pt will tolerate sitting EOB/EOM for 5 minutes to increase functional activity tolerance  Skilled Therapeutic Interventions/Progress Updates:    Pt seen for OT session focusing on ADL re-training. Pt in supine upon arrival, saying "I can't describe how I feel", BP 91/56, and pt voicing desire to get OOB. Pt with incontinent BM in brief, total A +1 for hygiene and clothing management. She was able to assist with maintaining side lying position once arm hooked over bed rail.  She transferred to EOB with max A assisting with walking R LE off EOB. Total A +1 sliding board transfer completed to w/c with +2 to steady equipment and assist with placing board. Seated in w/c at sink, oral care completed with assist to steady at wrist and elbow with use of dorsal wrist universal cuff. Washed face with hand over hand assist. Pt left seated in reclining w/c at end of session, positioned with pillows for comfort and all needs in reach.   Therapy Documentation Precautions:  Precautions Precautions: Fall, Cervical Required Braces or Orthoses: Cervical Brace Cervical Brace: Hard collar, At all times Restrictions Weight Bearing Restrictions: No  See Function Navigator for Current Functional Status.   Therapy/Group: Individual Therapy  Lewis, Roxene Alviar C 01/12/2016, 6:04 AM

## 2016-01-12 NOTE — Progress Notes (Signed)
Occupational Therapy Session Note  Patient Details  Name: [name] MRN: [id number] Date of Birth: 1946-07-26  Today's Date: 01/12/2016 OT Individual Time: 1210-1235 OT Individual Time Calculation (min): 25 min    Short Term Goals: Week 3:  OT Short Term Goal 1 (Week 3): Pt will complete oral care with min A using universal cuff  OT Short Term Goal 2 (Week 3): Pt wil direct caregiver with upper body dressing with Mod I  OT Short Term Goal 3 (Week 3): Pt will tolerate sitting EOB/EOM for 5 minutes to increase functional activity tolerance  Skilled Therapeutic Interventions/Progress Updates:    Pt seen for OT session focusing on self feeding. Pt received in day room set-up for lunch. Dorsal splint universal cuff donned on R UE. Required steadying assist at elbow and wrist. Assist required for motion to scoop food, and with steadying assist pt able to bring utensil to mouth. Able to self feed 100% of meal (ate ~75% of meal. Total A required for management of drink.VCs required throughout to maintain swallowing precautions. Returned to room, left with all needs in reach.   Therapy Documentation Precautions:  Precautions Precautions: Fall, Cervical Required Braces or Orthoses: Cervical Brace Cervical Brace: Hard collar, At all times Restrictions Weight Bearing Restrictions: No  See Function Navigator for Current Functional Status.   Therapy/Group: Individual Therapy  Lewis, Ramirez Fullbright C 01/12/2016, 3:51 PM

## 2016-01-12 NOTE — Progress Notes (Signed)
[name] is a 70 y.o. female 11-09-1945 [id number]  Subjective: No new complaints. Per staff: fowl swelling cathed urine, cloudy. Slept well. Feeling OK.  Objective: Vital signs in last 24 hours: Temp:  [97.6 F (36.4 C)-99.1 F (37.3 C)] 99.1 F (37.3 C) (06/17 0400) Pulse Rate:  [75-88] 88 (06/17 0400) Resp:  [16-17] 17 (06/17 0400) BP: (91-102)/(48-55) 102/55 mmHg (06/17 0400) SpO2:  [97 %-99 %] 97 % (06/17 0400) Weight change:  Last BM Date: 01/11/16  Intake/Output from previous day: 06/16 0701 - 06/17 0700 In: 180 [P.O.:180] Out: 350 [Urine:350] Last cbgs: CBG (last 3)  No results for input(s): GLUCAP in the last 72 hours.   Physical Exam General: No apparent distress   HEENT: not dry Lungs: Normal effort. Lungs clear to auscultation, no crackles or wheezes. Cardiovascular: Regular rate and rhythm, no edema Abdomen: S/NT/ND; BS(+) Musculoskeletal:  Unchanged - muscle atrophy Neurological: No new neurological deficits Wounds: N/A    Skin: clear   Mental state: Alert, oriented, cooperative    Lab Results: BMET    Component Value Date/Time   NA 133* 01/11/2016 1012   K 4.1 01/11/2016 1012   CL 105 01/11/2016 1012   CO2 23 01/11/2016 1012   GLUCOSE 111* 01/11/2016 1012   BUN 17 01/11/2016 1012   CREATININE 0.59 01/11/2016 1012   CREATININE 0.85 10/30/2010 0921   CALCIUM 9.0 01/11/2016 1012   GFRNONAA >60 01/11/2016 1012   GFRAA >60 01/11/2016 1012   CBC    Component Value Date/Time   WBC 9.8 01/11/2016 1012   RBC 3.20* 01/11/2016 1012   HGB 9.7* 01/11/2016 1012   HCT 30.0* 01/11/2016 1012   PLT 298 01/11/2016 1012   MCV 93.8 01/11/2016 1012   MCH 30.3 01/11/2016 1012   MCHC 32.3 01/11/2016 1012   RDW 13.7 01/11/2016 1012   LYMPHSABS 0.7 12/24/2015 0455   MONOABS 0.5 12/24/2015 0455   EOSABS 0.0 12/24/2015 0455   BASOSABS 0.0 12/24/2015 0455    Studies/Results: No results found.  Medications: I have reviewed the patient's  current medications.  Assessment/Plan:   1. 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 Cervical collar at all times - Continue CIR.  -SNF pending 2. DVT Prophylaxis/Anticoagulation: dopplers negative -changed to sq lovenox -Vascular ultrasound negative for lower extremity DVT on 5/31 3. Pain Management/fibromyalgia/chronic back pain: Chronic prednisone 10 mg twice a day, Oxycodone and Zanaflex as needed,  -Lyrica increased back to 50mg  tid. observe for lethargy  -dc sports cream (ineffective) -will add low dose ibuprofen 400mg  bid for a few days 4. Mood: Xanax 0.5 mg twice a day 5. Neuropsych: This patient is capable of making decisions on her own behalf. 6. Skin/Wound Care: Routine skin checks 7. Fluids/Electrolytes/Nutrition: intake improving with megace but inconsistent -D1 Thin diet  - Repleted potassium -recheck labs today 8. Hypertension. Hydrochlorothiazide 25 mg daily, lisinopril 20 mg daily. Fair control 9. Constipation. Laxative assistance 10. Hyperlipidemia.Lovaza 11. Neurogenic bladder/bowels continue ICP program -cath volumes 300-600 generally -targeting a PM bowel program but has been inconsistent 12. Hypoalbuminemia- eating much better 13. H/o Escherichia coli UTI. Abnormal urine 01/12/16 -keflex completed             -UA, Cx   Length of stay, days: 30  Walker Kehr , MD 01/12/2016, 8:57 AM

## 2016-01-13 ENCOUNTER — Inpatient Hospital Stay (HOSPITAL_COMMUNITY): Payer: Medicare Other | Admitting: Physical Therapy

## 2016-01-13 MED ORDER — MENTHOL 3 MG MT LOZG
1.0000 | LOZENGE | OROMUCOSAL | Status: DC | PRN
  Filled 2016-01-13: qty 9

## 2016-01-13 MED ORDER — PHENOL 1.4 % MT LIQD
1.0000 | OROMUCOSAL | Status: DC | PRN
  Administered 2016-01-13 – 2016-01-14 (×2): 1 via OROMUCOSAL
  Filled 2016-01-13: qty 177

## 2016-01-13 MED ORDER — OXYCODONE HCL 5 MG PO TABS
5.0000 mg | ORAL_TABLET | ORAL | Status: DC | PRN
  Administered 2016-01-13 – 2016-01-15 (×4): 5 mg via ORAL
  Filled 2016-01-13 (×4): qty 1

## 2016-01-13 NOTE — Plan of Care (Signed)
Problem: SCI BLADDER ELIMINATION Goal: RH STG MANAGE BLADDER WITH ASSISTANCE STG Manage Bladder With Total Assistance  Outcome: Progressing Continues to receive I/O caths due to urinary retention

## 2016-01-13 NOTE — Progress Notes (Signed)
01/13/16 nursing Patient  refused her megace this morning and laxatives offered 2x.

## 2016-01-13 NOTE — Progress Notes (Signed)
Physical Therapy Session Note  Patient Details  Name: [name] MRN: [id number] Date of Birth: [birth year]  Today's Date: 01/13/2016 PT Individual Time: 1007-1102 PT Individual Time Calculation (min): 55 min    Skilled Therapeutic Interventions/Progress Updates:    Pt received in w/c & agreeable to PT, noting 10/10 pain "all over" & RN administered pain medication. PT provided total A for tightening cervical collar as it appeared to be loose.  Transported pt into hallway & pt attempted to propel w/c with BUE but unable to grasp drive wheels and had difficulty even with Max A from PT. Transported pt to rehab gym & applied theraband on pt's drive wheels to attempt to help her grip wheel better. Pt required total A to transfer B hands back on wheels but able to grip wheel enough to extend elbow & produce sufficient push to advance w/c. PT provided max A with hand placement but pt able to push wheels without assistance from PT for a distance of 30 ft total. At end of session pt transported back to room & left in w/c with all needs within reach.   Therapy Documentation Precautions:  Precautions Precautions: Fall, Cervical Required Braces or Orthoses: Cervical Brace Cervical Brace: Hard collar, At all times Restrictions Weight Bearing Restrictions: No  Pain: Pain Assessment Pain Assessment: 0-10 Pain Score: 10-Worst pain ever Pain Location:  ("all over") Pain Intervention(s): RN made aware    See Function Navigator for Current Functional Status.   Therapy/Group: Individual Therapy  Waunita Schooner 01/13/2016, 8:02 AM

## 2016-01-13 NOTE — Progress Notes (Signed)
[name] is a 70 y.o. female 01-Mar-1946 [id number]  Subjective: C/o Tylenol causing HAs. C/o ST on the R.  Per staff: fowl swelling cathed urine, cloudy.   Objective: Vital signs in last 24 hours: Temp:  [97.6 F (36.4 C)-98.6 F (37 C)] 98.6 F (37 C) (06/18 0541) Pulse Rate:  [84] 84 (06/18 0541) Resp:  [16] 16 (06/18 0541) BP: (90-135)/(56-66) 135/66 mmHg (06/18 0541) SpO2:  [100 %] 100 % (06/18 0541) Weight change:  Last BM Date: 01/11/16  Intake/Output from previous day: 06/17 0701 - 06/18 0700 In: 420 [P.O.:420] Out: 2551 [Urine:2550; Stool:1] Last cbgs: CBG (last 3)  No results for input(s): GLUCAP in the last 72 hours.   Physical Exam General: No apparent distress   HEENT: not dry. Throat w/o erythema or thrush Lungs: Normal effort. Lungs clear to auscultation, no crackles or wheezes. Cardiovascular: Regular rate and rhythm, no edema Abdomen: S/NT/ND; BS(+) Musculoskeletal:  Unchanged - muscle atrophy Neurological: No new neurological deficits Wounds: N/A    Skin: clear   Mental state: Alert, oriented, cooperative    Lab Results: BMET    Component Value Date/Time   NA 133* 01/11/2016 1012   K 4.1 01/11/2016 1012   CL 105 01/11/2016 1012   CO2 23 01/11/2016 1012   GLUCOSE 111* 01/11/2016 1012   BUN 17 01/11/2016 1012   CREATININE 0.59 01/11/2016 1012   CREATININE 0.85 10/30/2010 0921   CALCIUM 9.0 01/11/2016 1012   GFRNONAA >60 01/11/2016 1012   GFRAA >60 01/11/2016 1012   CBC    Component Value Date/Time   WBC 9.8 01/11/2016 1012   RBC 3.20* 01/11/2016 1012   HGB 9.7* 01/11/2016 1012   HCT 30.0* 01/11/2016 1012   PLT 298 01/11/2016 1012   MCV 93.8 01/11/2016 1012   MCH 30.3 01/11/2016 1012   MCHC 32.3 01/11/2016 1012   RDW 13.7 01/11/2016 1012   LYMPHSABS 0.7 12/24/2015 0455   MONOABS 0.5 12/24/2015 0455   EOSABS 0.0 12/24/2015 0455   BASOSABS 0.0 12/24/2015 0455    Studies/Results: No results found.  Medications: I  have reviewed the patient's current medications.  Assessment/Plan:   1. 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 Cervical collar at all times - Continue CIR.  -SNF pending 2. DVT Prophylaxis/Anticoagulation: dopplers negative -changed to sq lovenox -Vascular ultrasound negative for lower extremity DVT on 5/31 3. Pain Management/fibromyalgia/chronic back pain: Chronic prednisone 10 mg twice a day, Oxycodone and Zanaflex as needed,  -Lyrica increased back to 50mg  tid. observe for lethargy  -dc sports cream (ineffective). D/c Tylenol. -will add low dose ibuprofen 400mg  bid for a few days 4. Mood: Xanax 0.5 mg twice a day 5. Neuropsych: This patient is capable of making decisions on her own behalf. 6. Skin/Wound Care: Routine skin checks 7. Fluids/Electrolytes/Nutrition: intake improving with megace but inconsistent -D1 Thin diet  - Repleted potassium -recheck labs today 8. Hypertension. Hydrochlorothiazide 25 mg daily, lisinopril 20 mg daily. Fair control 9. Constipation. Laxative assistance 10. Hyperlipidemia.Lovaza 11. Neurogenic bladder/bowels continue ICP program -cath volumes 300-600 generally -targeting a PM bowel program but has been inconsistent 12. Hypoalbuminemia- eating much better 13. H/o Escherichia coli UTI. Abnormal urine 01/12/16 -keflex completed. Macrobid             -UA, Cx 14. ST ?etiology - Cepachol spray prn.    Length of stay, days: 23  Walker Kehr , MD 01/13/2016, 9:20 AM

## 2016-01-14 ENCOUNTER — Inpatient Hospital Stay (HOSPITAL_COMMUNITY): Payer: Medicare Other | Admitting: Occupational Therapy

## 2016-01-14 ENCOUNTER — Inpatient Hospital Stay (HOSPITAL_COMMUNITY): Payer: Medicare Other

## 2016-01-14 ENCOUNTER — Inpatient Hospital Stay (HOSPITAL_COMMUNITY): Payer: Medicare Other | Admitting: Speech Pathology

## 2016-01-14 MED ORDER — OXYCODONE HCL 5 MG PO TABS: 5.0000 mg | ORAL_TABLET | ORAL | Status: DC | PRN

## 2016-01-14 MED ORDER — PREGABALIN 50 MG PO CAPS: 50.0000 mg | ORAL_CAPSULE | Freq: Three times a day (TID) | ORAL | Status: DC

## 2016-01-14 MED ORDER — ALPRAZOLAM 0.5 MG PO TABS: 0.5000 mg | ORAL_TABLET | Freq: Two times a day (BID) | ORAL | Status: DC

## 2016-01-14 NOTE — Progress Notes (Signed)
Social Work Patient ID: [name], female   DOB: Oct 02, 1945, 70 y.o.   MRN: [id number]   Received SNF bed offer today from 4 facilities and pt/sister have accepted offer from [facility] with plans to admit tomorrow.  Will travel via ambulance and plan d/c in the morning.  [name], LCSW

## 2016-01-14 NOTE — Discharge Summary (Signed)
NAMEHAYDAN, [name]       ACCOUNT NO.:  [id number]  MEDICAL RECORD NO.:  [id number]  LOCATION:  [id number]                        FACILITY:  [facility]  PHYSICIAN:  Meredith Staggers, M.D.DATE OF BIRTH:  1946/03/06  DATE OF ADMISSION:  12/21/2015 DATE OF DISCHARGE:  01/15/2016                              DISCHARGE SUMMARY   DISCHARGE DIAGNOSES: 1. Cervical myelopathy secondary to spondylosis with spastic     tetraparesis, status post anterior-posterior fusion, Dec 18, 2015. 2. Subcutaneous Lovenox for deep venous thrombosis prophylaxis. 3. Pain management. 4. Hypertension. 5. Constipation. 6. Hyperlipidemia. 7. Neurogenic bowel and bladder. 8. Escherichia coli urinary tract infection, resolved. 9. Possible pulmonary nodule evident on lateral view of chest x-ray 01/14/2016     HISTORY OF PRESENT ILLNESS:  This is a 70 year old right-handed female, history of fibromyalgia, hypertension, lives with her sister.  Presented on Dec 18, 2015, for numbness and weakness involving both hands and lower extremities that progressed over the past 6-8 months.  She also had been experienced increasing neck pain.  Denied any bowel or bladder disturbances.  X-rays and imaging revealed cervical spondylosis with followup, C3-C6.  Underwent two-stage approach diskectomy, C3-C4, C4-5 with decompression, placement of intravertebral biomechanical device, [employer] with C3-C6 laminectomy for decompression of spinal cord, posterior segmental instrumentation, Dec 18, 2015, per Dr. Kathyrn Sheriff. Hospital course, pain management.  Cervical collar at all times.  The patient was admitted for a comprehensive rehab program.  PAST MEDICAL HISTORY:  See discharge diagnoses.  SOCIAL HISTORY:  Lives with sister, used a walker prior to admission.  FUNCTIONAL STATUS:  Upon admission to [place] was moderate assist to ambulate 4 feet rolling walker, +2 physical assist sit to stand; mod- to-max assist,  activities of daily living.  PHYSICAL EXAMINATION:  VITAL SIGNS:  Blood pressure 132/73, pulse 95, temperature 98, respirations 20. GENERAL:  This was an alert female, oriented x3. NECK:  Cervical collar in place. LUNGS:  Clear to auscultation without wheeze. CARDIAC:  Regular rate and rhythm.  No murmur. ABDOMEN:  Soft, nontender.  Good bowel sounds.  REHABILITATION HOSPITAL COURSE:  The patient was admitted to Inpatient Rehab Services with therapies initiated on a 3-hour daily basis, consisting of physical therapy, occupational therapy, speech therapy and rehabilitation nursing.  Following issues were addressed during the patient's rehabilitation stay.  Pertaining to Mrs. [name] cervical myelopathy, spastic tetraparesis, she had undergone anterior- posterior fusion, Dec 18, 2015.  She would follow up with Dr. Kathyrn Sheriff. Cervical collar at all times.  She was using oxycodone as needed for pain.  Chronic prednisone for history of fibromyalgia.  Xanax as needed for anxiety.  Blood pressures remained well controlled, on lisinopril. She was tolerating a dysphagia 1 thin-liquid diet.  Bouts of constipation resolved with laxative assistance.  Neurogenic bowel and bladder.  Intermittent catheterizations as needed.  She had completed a course of Keflex for E. coli urinary tract infection.  Subcutaneous Lovenox for DVT prophylaxis through 03/11/2016 and stop.  Venous Doppler studies negative.  The patient received weekly collaborative interdisciplinary team conferences to discuss estimated length of stay, family teaching, any barriers to discharge.  The patient total assist for tightening her cervical collar. Transported the patient to hallway, attempted to propofol wheelchair, max assist.  Required total assist to transfer bilateral hands back on the wheels, but able to grip well enough to extend elbow and produce sufficient push to advance the wheelchair.  She needed  significant assistance for activities of daily living and grooming, homemaking and hygiene.  During her hospital course, a followup urine study was done after completion of Keflex, showed greater than 100,000 Gram-negative rods.  She was completing a course of Macrobid.  The patient with limited resources at home for support, felt skilled nursing facility was needed, but bed becoming available on January 15, 2016.  DISCHARGE MEDICATIONS: 1. Xanax 0.5 mg p.o. b.i.d. 2. Aspirin 81 mg p.o. daily. 3. Dulcolax suppository daily after supper. 4. Pepcid 20 mg p.o. daily. 5. Ibuprofen 400 mg p.o. b.i.d. 6. Lisinopril 20 mg p.o. daily. 7. Magnesium oxide 400 mg p.o. daily. 8. Macrobid 100 mg p.o. every 12 hours x7 days and stop. 9. Lovaza 1 g p.o. daily. 10.Oxycodone immediate release 5 mg every 4 hours as needed, severe     pain. 11.Protonix 40 mg p.o. at bedtime. 12.Prednisone 10 mg p.o. b.i.d. 13.Lyrica 50 mg p.o. t.i.d. 14.Vitamin B12 1000 mcg p.o. daily. 15. Subcutaneous Lovenox 30 mg every 12 hours until 03/11/2016 and stop 16. Zanaflex 2 mg bedtime as needed  DIET:  Dysphagia 1 thin liquids.  SPECIAL INSTRUCTIONS:  Intermittent catheterization every 6 hours as needed.  Maintain Aspen cervical collar at all times.  The patient should follow up with Dr. Alger Simons at the Outpatient Rehab Service Office as directed; Dr. Kathyrn Sheriff, Neurosurgery, call for appointment.  Routine chest x-ray for nursing home placement 01/14/2016 showed possible pulmonary nodule evident on lateral view. Recommendations are for unenhanced chest CT could be completed as outpatient   Lauraine Rinne, P.A.   ______________________________ Meredith Staggers, M.D.    DA/MEDQ  D:  01/14/2016  T:  01/14/2016  Job:  [id number]  cc:   Dr. Patria Mane A. Sarajane Jews, MD

## 2016-01-14 NOTE — Progress Notes (Signed)
Occupational Therapy Session Note  Patient Details  Name: [name] MRN: [id number] Date of Birth: Apr 13, 1946  Today's Date: 01/14/2016 OT Concurrent Time: 1300-1330 OT Concurrent Time Calculation (min): 30 min   Short Term Goals: Week 3:  OT Short Term Goal 1 (Week 3): Pt will complete oral care with min A using universal cuff  OT Short Term Goal 2 (Week 3): Pt wil direct caregiver with upper body dressing with Mod I  OT Short Term Goal 3 (Week 3): Pt will tolerate sitting EOB/EOM for 5 minutes to increase functional activity tolerance   Skilled Therapeutic Interventions/Progress Updates:    Pt seen for OT concurrent tx focusing on self feeding using dominant R UE with use of universal cuff. Seated in w/c at high/low table. Pt able to scoop food and bring to mouth with steadying assist at wrist and elbow. Requires assist at wrist due to decreased strength needed to balance and maintain position of utensil to bring to mouth. Total A required to manage cup. VCs provided throughout for maintaining swallowing precautions. Pt returned to room, left with all needs in reach.   Therapy Documentation Precautions:  Precautions Precautions: Fall, Cervical Required Braces or Orthoses: Cervical Brace Cervical Brace: Hard collar, At all times Restrictions Weight Bearing Restrictions: No   See Function Navigator for Current Functional Status.   Therapy/Group: Individual Therapy  Lewis, Preet Mangano C 01/14/2016, 2:01 PM

## 2016-01-14 NOTE — Progress Notes (Signed)
Speech Language Pathology Session Note & Discharge Summary  Patient Details  Name: [name] MRN: [id number] Date of Birth: 06-16-1946  Today's Date: 01/14/2016 SLP Individual Time: 1400-1453 SLP Individual Time Calculation (min): 53 min   Skilled Therapeutic Interventions:  Skilled treatment session focused on cognitive goals. SLP facilitated session by re-administering the MoCA-BLIND. Patient scored 14/22 points with a score of 18 or above considered normal. Patient continues to demonstrate impairments in short-term recall and organization/attention. Patient declined all trials and was perseverative on getting back into bed so she could "get warm" and complete her x-ray. Patient was transferred to the bed with total +2 assist for safety with the slideboard. Patient left supine in bed with all needs within reach. Continue with current plan of care.    Patient has met 5 of 5 long term goals.  Patient to discharge at [facility] level.   Reasons goals not met: N/A   Clinical Impression/Discharge Summary: Patient has made excellent gains and has met 5 of 5 LTG's this admission due to improved swallowing, speech and cognitive function. Currently, patient is consuming Dys. 1 textures with thin liquids with intermittent overt s/s of aspiration and requires overall Min A verbal cues for use of swallowing strategies. Patient is 100% intelligible at the conversation level with Mod I and requires overall supervision verbal cues to complete functional and familiar tasks safely in regards to recall, problem solving, attention and awareness. Patient and family education is complete. Patient's family is unable to provide the necessary physical and cognitive assistance needed at this time, therefore, patient will discharge to a SNF. Patient would benefit from f/u SLP services to maximize her swallowing and cognitive function and overall functional independence in order to reduce caregiver burden.    Care Partner:  Caregiver Able to Provide Assistance: No  Type of Caregiver Assistance: Physical;Cognitive  Recommendation:  Skilled Nursing facility  Rationale for SLP Follow Up: Maximize cognitive function and independence;Maximize swallowing safety;Maximize functional communication;Reduce caregiver burden   Equipment: N/A   Reasons for discharge: Discharged from hospital   Patient/Family Agrees with Progress Made and Goals Achieved: Yes   Function:  Eating Eating   Modified Consistency Diet: Yes Eating Assist Level:  (Steadying assist at wrist and elbow and use of universal cuff)           Cognition Comprehension Comprehension assist level: Understands complex 90% of the time/cues 10% of the time  Expression   Expression assist level: Expresses complex 90% of the time/cues < 10% of the time  Social Interaction Social Interaction assist level: Interacts appropriately with others with medication or extra time (anti-anxiety, antidepressant).;Interacts appropriately 90% of the time - Needs monitoring or encouragement for participation or interaction.  Problem Solving Problem solving assist level: Solves basic 90% of the time/requires cueing < 10% of the time  Memory Memory assist level: Recognizes or recalls 90% of the time/requires cueing < 10% of the time   [name] 01/14/2016, 3:31 PM

## 2016-01-14 NOTE — Progress Notes (Signed)
Occupational Therapy Session Note  Patient Details  Name: [name] MRN: [id number] Date of Birth: [birth year]  Today's Date: 01/14/2016 OT Individual Time:  4008-6761  60 minute session       Short Term Goals: Week 3:  OT Short Term Goal 1 (Week 3): Pt will complete oral care with min A using universal cuff  OT Short Term Goal 1 - Progress (Week 3): Not met OT Short Term Goal 2 (Week 3): Pt wil direct caregiver with upper body dressing with Mod I  OT Short Term Goal 2 - Progress (Week 3): Not met OT Short Term Goal 3 (Week 3): Pt will tolerate sitting EOB/EOM for 5 minutes to increase functional activity tolerance OT Short Term Goal 3 - Progress (Week 3): Met  Skilled Therapeutic Interventions/Progress Updates:    Pt seen for skilled OT session focusing on ADL retraining and functional mobility. Upon arrival pt supine in bed and agreeable to OT. Pants and TEDs donned total A. Pt rolled with max A- total A to sit up EOB with total + 1 for sitting balance. Pt was able to tolerate sitting EOB for 10 minutes while completing grooming tasks including using universal cuff to brush teeth and wash face with steady A and OT washing again for thoroughness. Pt requires therapist support at the elbow and wrist due to decreased strength. Pt used sliding boards and  2 helpers to transfer from bed to w/c and completed upper body dressing from w/c.  Pt was able to wiggle shirt down to forearms once OT threaded shirt over fingers. After pt initiated threading of shirt OT completed donning with total A.  Per pt request OT stretched and ranged pt shoulders in preparation for functional task.  Pt worked on self-propelling w/c with assistance from OT for hand placement. Pt was able to self propel w/c two feet with increased time, theraband on wheels and hand repositioning due to decreased strength in shoulders, biceps, triceps pt is unable to bring hands back to starting position after one push. Pt was  left in w/c with call bell in reach.   Therapy Documentation Precautions:  Precautions Precautions: Fall, Cervical Required Braces or Orthoses: Cervical Brace Cervical Brace: Hard collar, At all times Restrictions Weight Bearing Restrictions: No  Therapy/Group: Individual Therapy  Matilde Bash 01/14/2016, 3:10 PM

## 2016-01-14 NOTE — Plan of Care (Signed)
Problem: RH Balance Goal: LTG: Patient will maintain dynamic sitting balance (OT) LTG: Patient will maintain dynamic sitting balance with assistance during activities of daily living (OT)  Outcome: Not Met (add Reason) Pt requires max- total A for dynamic sitting balance. -AL  Problem: RH Dressing Goal: LTG Patient will perform upper body dressing (OT) LTG Patient will perform upper body dressing with assist, with/without cues (OT).  Outcome: Not Met (add Reason) Pt requires total A for UB dressing- AL

## 2016-01-14 NOTE — Progress Notes (Signed)
Physical Therapy Discharge Summary  Patient Details  Name: [name] MRN: [id number] Date of Birth: 1946/05/28  Patient has met 1 of 3 long term goals due to improved activity tolerance, improved balance, improved postural control, increased strength, decreased pain and improved attention.  Patient to discharge at a wheelchair level Total Assist.   Patient's care partner unavailable to provide the necessary physical assistance at discharge and therefore, pt is discharging to SNF.  Reasons goals not met: Goals were set for max assist but pt still often requires anywhere from max to total A+2 for functional transfers and bed mobility.   Recommendation:  Patient will benefit from ongoing skilled PT services in skilled nursing facility setting to continue to advance safe functional mobility, address ongoing impairments in quadriparesis, strength, balance, postural control, endurance, tone, ROM, sensation, and minimize fall risk.  Equipment: TBD at next venue of care  Reasons for discharge: discharge from hospital  Patient/family agrees with progress made and goals achieved: Yes  PT Discharge Precautions/Restrictions Precautions Precautions: Fall;Cervical Required Braces or Orthoses: Cervical Brace Cervical Brace: Hard collar;At all times Restrictions Weight Bearing Restrictions: No  Cognition Overall Cognitive Status: Within Functional Limits for tasks assessed Arousal/Alertness: Awake/alert Orientation Level: Oriented X4 Attention: Focused;Sustained Focused Attention: Appears intact Sustained Attention: Appears intact Selective Attention: Appears intact Alternating Attention: Appears intact Memory: Impaired Memory Impairment: Decreased recall of new information;Decreased short term memory Decreased Short Term Memory: Verbal basic;Functional basic Awareness: Appears intact Problem Solving: Impaired Problem Solving Impairment: Verbal complex;Functional  complex Reasoning: Impaired Reasoning Impairment: Verbal complex;Functional complex Organizing: Impaired Organizing Impairment: Verbal complex;Functional complex Safety/Judgment: Appears intact Sensation Sensation Light Touch: Impaired Detail Light Touch Impaired Details: Impaired LLE;Impaired RLE Proprioception: Impaired by gross assessment Coordination Gross Motor Movements are Fluid and Coordinated: No Motor  Motor Motor: Tetraplegia;Abnormal tone;Abnormal postural alignment and control  Trunk/Postural Assessment  Cervical Assessment Cervical Assessment: Exceptions to [facility] (hard collar) Thoracic Assessment Thoracic Assessment: Exceptions to [facility] (kyphotic; decreased control due to SCI) Lumbar Assessment Lumbar Assessment: Exceptions to [facility] (posterior pelvic tilt) Postural Control Postural Control: Deficits on evaluation Trunk Control: impaired due to SCI; mod to total assist  Balance Balance Balance Assessed: Yes Static Sitting Balance Static Sitting - Level of Assistance: 2: Max assist;3: Mod assist Dynamic Sitting Balance Sitting balance - Comments: max to total A Extremity Assessment   see OT d/c summary for UE details   RLE Assessment RLE Assessment: Exceptions to [facility] RLE Strength RLE Overall Strength Comments: grossly 2+ to 3-/5; decreased muscular endurance LLE Assessment LLE Assessment: Exceptions to [facility] LLE Strength LLE Overall Strength Comments: 1+ to 2- at hip; 2-/5 knee and ankle   See Function Navigator for Current Functional Status.  Canary Brim Ivory Broad, PT, DPT  01/14/2016, 2:36 PM

## 2016-01-14 NOTE — Progress Notes (Signed)
Subjective/Complaints: Wants better food to eat! Headache still present but was better at times over the weekend.   Review of systems denies any breathing problems no chest pain no shortness of breath, negative for nausea, vomiting, diarrhea  Objective: Vital Signs: Blood pressure 128/66, pulse 73, temperature 98.3 F (36.8 C), temperature source Oral, resp. rate 17, height '5\' 9"'  (1.753 m), weight 61.689 kg (136 lb), last menstrual period 03/31/2004, SpO2 100 %. No results found. Results for orders placed or performed during the hospital encounter of 12/21/15 (from the past 72 hour(s))  Basic metabolic panel     Status: Abnormal   Collection Time: 01/11/16 10:12 AM  Result Value Ref Range   Sodium 133 (L) 135 - 145 mmol/L   Potassium 4.1 3.5 - 5.1 mmol/L   Chloride 105 101 - 111 mmol/L   CO2 23 22 - 32 mmol/L   Glucose, Bld 111 (H) 65 - 99 mg/dL   BUN 17 6 - 20 mg/dL   Creatinine, Ser 0.59 0.44 - 1.00 mg/dL   Calcium 9.0 8.9 - 10.3 mg/dL   GFR calc non Af Amer >60 >60 mL/min   GFR calc Af Amer >60 >60 mL/min    Comment: (NOTE) The eGFR has been calculated using the CKD EPI equation. This calculation has not been validated in all clinical situations. eGFR's persistently <60 mL/min signify possible Chronic Kidney Disease.    Anion gap 5 5 - 15  CBC     Status: Abnormal   Collection Time: 01/11/16 10:12 AM  Result Value Ref Range   WBC 9.8 4.0 - 10.5 K/uL   RBC 3.20 (L) 3.87 - 5.11 MIL/uL   Hemoglobin 9.7 (L) 12.0 - 15.0 g/dL   HCT 30.0 (L) 36.0 - 46.0 %   MCV 93.8 78.0 - 100.0 fL   MCH 30.3 26.0 - 34.0 pg   MCHC 32.3 30.0 - 36.0 g/dL   RDW 13.7 11.5 - 15.5 %   Platelets 298 150 - 400 K/uL  Urinalysis, Routine w reflex microscopic (not at [facility])     Status: Abnormal   Collection Time: 01/12/16 10:04 PM  Result Value Ref Range   Color, Urine AMBER (A) YELLOW    Comment: BIOCHEMICALS MAY BE AFFECTED BY COLOR   APPearance CLOUDY (A) CLEAR   Specific Gravity, Urine 1.030  1.005 - 1.030   pH 6.0 5.0 - 8.0   Glucose, UA NEGATIVE NEGATIVE mg/dL   Hgb urine dipstick MODERATE (A) NEGATIVE   Bilirubin Urine SMALL (A) NEGATIVE   Ketones, ur NEGATIVE NEGATIVE mg/dL   Protein, ur NEGATIVE NEGATIVE mg/dL   Nitrite POSITIVE (A) NEGATIVE   Leukocytes, UA SMALL (A) NEGATIVE  Urine microscopic-add on     Status: Abnormal   Collection Time: 01/12/16 10:04 PM  Result Value Ref Range   Squamous Epithelial / LPF 0-5 (A) NONE SEEN   WBC, UA 6-30 0 - 5 WBC/hpf   RBC / HPF 6-30 0 - 5 RBC/hpf   Bacteria, UA MANY (A) NONE SEEN     General: No acute distress. Vital signs reviewed. Psych: Mood is up beat. More alert today.  Heart: Regular rate and rhythm no rubs murmurs or extra sounds Lungs: Clear to auscultation, breathing unlabored, no rales or wheezes Abdomen: Positive bowel sounds, soft nontender to palpation, nondistended Skin: No evidence of breakdown, no evidence of rash Neurologic: speech clear. Very alert Motor strength:  Right upper extremity: 1+ to 2- delt/bic trace to 1 tricep,wrist, 0 HI Left upper  extremity: 1+ bicep,deltoid, trace wrist/tricep, 0 HI Right lower extremity: 2+/5 proximal distal Left lower extremity: 1/5 proximal to distal Sensory exam absent in bilateral lower extremities to light touch Musculoskeletal: No edema. No tenderness.  Assessment/Plan: 1. 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 Physiatrist is providing close team supervision and 24 hour management of active medical problems listed below. Physiatrist and rehab team continue to assess barriers to discharge/monitor patient progress toward functional and medical goals. FIM: Function - Bathing Bathing activity did not occur: N/A (Night bath) Position: Bed Body parts bathed by patient: Right arm, Left arm, Chest, Abdomen (Hand over hand assist) Body  parts bathed by helper: Right arm, Left arm, Chest, Abdomen, Front perineal area, Buttocks, Right upper leg, Left upper leg, Right lower leg, Left lower leg, Back Bathing not applicable: Right upper leg, Left upper leg, Right lower leg, Left lower leg, Back Assist Level: Touching or steadying assistance(Pt > 75%) (hand over hand assist using bath mit)  Function- Upper Body Dressing/Undressing What is the patient wearing?: Hospital gown Pull over shirt/dress - Perfomed by patient: Thread/unthread right sleeve, Thread/unthread left sleeve Pull over shirt/dress - Perfomed by helper: Put head through opening, Pull shirt over trunk, Thread/unthread left sleeve, Thread/unthread right sleeve Button up shirt - Perfomed by helper: Thread/unthread right sleeve, Thread/unthread left sleeve, Pull shirt around back, Button/unbutton shirt (Jacket) Assist Level: 2 helpers Function - Lower Body Dressing/Undressing What is the patient wearing?: Hospital Gown Position: Bed Pants- Performed by helper: Thread/unthread right pants leg, Thread/unthread left pants leg, Pull pants up/down Non-skid slipper socks- Performed by helper: Don/doff right sock, Don/doff left sock TED Hose - Performed by helper: Don/doff right TED hose, Don/doff left TED hose Assist for footwear: Dependant Assist for lower body dressing: 2 Helpers  Function - Toileting Toileting activity did not occur: No continent bowel/bladder event Toileting steps completed by patient: Adjust clothing prior to toileting, Performs perineal hygiene, Adjust clothing after toileting (per Berkley Harvey, Nt report) Assist level: Two helpers (Per [place], NT report)  Function - Air cabin crew transfer activity did not occur: Safety/medical concerns  Function - Chair/bed transfer Chair/bed transfer method: Lateral scoot Chair/bed transfer assist level: 2 helpers Chair/bed transfer assistive device: Armrests, Sliding board Chair/bed transfer details:  Tactile cues for posture, Tactile cues for placement, Manual facilitation for weight shifting, Manual facilitation for placement, Verbal cues for safe use of DME/AE, Verbal cues for precautions/safety, Verbal cues for technique, Manual facilitation for weight bearing  Function - Locomotion: Wheelchair Will patient use wheelchair at discharge?: Yes Type: Manual Wheelchair activity did not occur: Safety/medical concerns Max wheelchair distance: 30 ft Assist Level: Maximal assistance (Pt 25 - 49%) Wheel 50 feet with 2 turns activity did not occur: Safety/medical concerns Assist Level: Dependent (Pt equals 0%) Wheel 150 feet activity did not occur: Safety/medical concerns Assist Level: Dependent (Pt equals 0%) Turns around,maneuvers to table,bed, and toilet,negotiates 3% grade,maneuvers on rugs and over doorsills: No Function - Locomotion: Ambulation Ambulation activity did not occur: Safety/medical concerns Walk 10 feet activity did not occur: Safety/medical concerns Walk 50 feet with 2 turns activity did not occur: Safety/medical concerns Walk 150 feet activity did not occur: Safety/medical concerns Walk 10 feet on uneven surfaces activity did not occur: Safety/medical concerns  Function - Comprehension Comprehension: Auditory Comprehension assist level: Follows complex conversation/direction with extra time/assistive device  Function - Expression Expression: Verbal Expression assist level: Expresses  complex 90% of the time/cues < 10% of the time  Function - Social Interaction Social Interaction assist level: Interacts appropriately 90% of the time - Needs monitoring or encouragement for participation or interaction.  Function - Problem Solving Problem solving assist level: Solves basic 90% of the time/requires cueing < 10% of the time  Function - Memory Memory assist level: Recognizes or recalls 90% of the time/requires cueing < 10% of the time Patient normally able to recall  (first 3 days only): Staff names and faces, That he or she is in a hospital, Current season  Medical Problem List and Plan: 1.  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 Cervical collar at all times   - Continue CIR.    -SNF pending 2.  DVT Prophylaxis/Anticoagulation: dopplers negative  -changed to sq lovenox  -Vascular ultrasound negative for lower extremity DVT on 5/31 3. Pain Management/fibromyalgia/chronic back pain: Chronic prednisone 10 mg twice a day,  Oxycodone and Zanaflex as needed,    -Lyrica increased back to 34m tid. observe for lethargy  -robaxin stopped    -dc sports cream (ineffective)  -continue low dose ibuprofen 4041mbid for a few days 4. Mood: Xanax 0.5 mg twice a day 5. Neuropsych: This patient is capable of making decisions on her own behalf. 6. Skin/Wound Care: Routine skin checks 7. Fluids/Electrolytes/Nutrition: intake overall improved with megace. Wants diet upgrade!  -D1 Thin diet   -  Repleted potassium  -  8. Hypertension. Hydrochlorothiazide 25 mg daily, lisinopril 20 mg daily. Fair control 9. Constipation. Laxative assistance 10. Hyperlipidemia.Lovaza 11. Neurogenic bladder/bowels  continue I/C's   -targeting a PM bowel program but has been inconsistent 12. Hypoalbuminemia- eating much better 13. Escherichia coli UTI  -keflex completed.     LOS (Days) 24 A FACE TO FACE EVALUATION WAS PERFORMED  Alastor Kneale T 01/14/2016, 8:34 AM

## 2016-01-14 NOTE — Progress Notes (Signed)
Occupational Therapy Discharge Summary  Patient Details  Name: [name] MRN: [id number] Date of Birth: [birth year]   Patient has met 4 of 6 long term goals due to improved activity tolerance, improved balance, postural control, functional use of  RIGHT upper and LEFT upper extremity and improved coordination.  Patient to discharge at overall Total Assist level.  Patient's care partner is unable to provide needed assistance. Pt will continue to receive skilled therapy at SNF.   Reasons goals not met:  Pt requires max-total A due to fatigue, pain and decreased strength in bilateral UE. Pt highly motivated and initiates movements but is unable to complete ADLs due to decreased strength and coordination.   Recommendation:  Patient will benefit from ongoing skilled OT services in skilled nursing facility setting to continue to advance functional skills in the area of BADL and Reduce care partner burden.  Equipment: No equipment provided to be determined at next venue of care   Reasons for discharge: discharge from hospital  Patient/family agrees with progress made and goals achieved: Yes  OT Discharge Precautions/Restrictions  Precautions Precautions: Fall;Cervical Required Braces or Orthoses: Cervical Brace Cervical Brace: Hard collar;At all times Restrictions Weight Bearing Restrictions: No Vision/Perception  Vision- History Patient Visual Report: No change from baseline Vision- Assessment Vision Assessment?: No apparent visual deficits  Cognition Overall Cognitive Status: Within Functional Limits for tasks assessed Arousal/Alertness: Awake/alert Orientation Level: Oriented X4 Attention: Focused;Sustained Focused Attention: Appears intact Sustained Attention: Appears intact Selective Attention: Appears intact Alternating Attention: Appears intact Divided Attention: Appears intact Memory: Impaired Memory Impairment: Decreased recall of new information;Decreased  short term memory Decreased Short Term Memory: Verbal complex;Functional complex Awareness: Appears intact Problem Solving: Appears intact Problem Solving Impairment: Verbal complex;Functional complex Executive Function: Reasoning;Sequencing;Organizing;Decision Making;Initiating;Self Correcting;Self Monitoring Reasoning: Appears intact Reasoning Impairment: Verbal complex;Functional complex Sequencing: Appears intact Organizing: Appears intact Organizing Impairment: Verbal complex;Functional complex Decision Making: Appears intact Initiating: Appears intact Self Monitoring: Appears intact Self Correcting: Appears intact Safety/Judgment: Appears intact Sensation Sensation Light Touch: Impaired Detail Light Touch Impaired Details: Impaired RUE;Impaired LUE Stereognosis: Impaired by gross assessment Hot/Cold: Not tested Proprioception: Not tested Coordination Gross Motor Movements are Fluid and Coordinated: No Fine Motor Movements are Fluid and Coordinated: No Coordination and Movement Description: Pt has small motor recovery, but it is not functional  Motor  Motor Motor: Tetraplegia;Abnormal tone;Abnormal postural alignment and control Trunk/Postural Assessment  Cervical Assessment Cervical Assessment: Within Functional Limits Thoracic Assessment Thoracic Assessment: Within Functional Limits Lumbar Assessment Lumbar Assessment: Within Functional Limits Postural Control Postural Control: Within Functional Limits Trunk Control: impaired due to SCI; mod to total assist  Balance Balance Balance Assessed: Yes Static Sitting Balance Static Sitting - Level of Assistance: 2: Max assist Dynamic Sitting Balance Sitting balance - Comments: Max A  Extremity/Trunk Assessment RUE Assessment RUE Assessment: Within Functional Limits RUE Strength Right Elbow Flexion: 2+/5 Right Elbow Extension: 2+/5 Right Hand Gross Grasp: Impaired LUE Assessment LUE Assessment: Exceptions to  [facility] LUE Strength Left Elbow Flexion: 3-/5 Left Elbow Extension: 3-/5 Left Hand Gross Grasp: Impaired   See Function Navigator for Current Functional Status.  Matilde Bash 01/14/2016, 4:09 PM

## 2016-01-14 NOTE — Discharge Summary (Signed)
Discharge summary job [id number]

## 2016-01-14 NOTE — Progress Notes (Signed)
Physical Therapy Session Note  Patient Details  Name: CARNELLA TIENDA MRN: [id number] Date of Birth: 02/17/1946  Today's Date: 01/14/2016 PT Individual Time: 1114-1201 PT Individual Time Calculation (min): 47 min    Skilled Therapeutic Interventions/Progress Updates:    Session focused on grad day activities including w/c mobility, car transfers, mat transfers, and sitting balance. See d/c summary for complete details of assessment. Pt required max to total A for transfers (+2 for car transfer) with cues for technique, hand placement, and assist for balance. Pt demonstrating improved trunk control with static sitting balance on edge of mat even able to maintain a few seconds with close supervision ranging up to total A.   Therapy Documentation Precautions:  Precautions Precautions: Fall, Cervical Required Braces or Orthoses: Cervical Brace Cervical Brace: Hard collar, At all times Restrictions Weight Bearing Restrictions: No  Pain: Premedicated for pain in neck.    See Function Navigator for Current Functional Status.   Therapy/Group: Individual Therapy  Canary Brim Ivory Broad, PT, DPT  01/14/2016, 12:05 PM

## 2016-01-15 ENCOUNTER — Inpatient Hospital Stay (HOSPITAL_COMMUNITY): Payer: Medicare Other

## 2016-01-15 ENCOUNTER — Inpatient Hospital Stay (HOSPITAL_COMMUNITY): Payer: Medicare Other | Admitting: Occupational Therapy

## 2016-01-15 LAB — URINE CULTURE: Culture: >=100000 — AB

## 2016-01-15 MED ORDER — TIZANIDINE HCL 2 MG PO TABS: 2.0000 mg | ORAL_TABLET | Freq: Every evening | ORAL | Status: DC | PRN

## 2016-01-15 MED ORDER — TIZANIDINE HCL 2 MG PO CAPS: 2.0000 mg | ORAL_CAPSULE | Freq: Every evening | ORAL | Status: DC | PRN

## 2016-01-15 NOTE — Progress Notes (Signed)
Patient discharged to [facility] via EMS. Report called to Nursing Supervisor at [facility]. Patient's sister to take patient belongings to facility.

## 2016-01-15 NOTE — Progress Notes (Signed)
Subjective/Complaints: In good spirits. Ready for transfer today but will miss her "angels".   Review of systems denies any breathing problems no chest pain no shortness of breath, negative for nausea, vomiting, diarrhea  Objective: Vital Signs: Blood pressure 124/71, pulse 81, temperature 99.2 F (37.3 C), temperature source Oral, resp. rate 16, height 5\' 9"  (1.753 m), weight 61.689 kg (136 lb), last menstrual period 03/31/2004, SpO2 100 %. Dg Chest 2 View  01/14/2016  CLINICAL DATA:  Cough. EXAM: CHEST  2 VIEW COMPARISON:  01/30/2015 FINDINGS: Cardiac silhouette is normal in size and configuration. Normal mediastinal and hilar contours. On the lateral view, there is an apparent nodule projecting over the ascending aorta. This is not evident on the frontal view, but may be hidden by the mediastinum or hila. Lungs are otherwise clear.  No pleural effusion or pneumothorax. Bony thorax is intact. IMPRESSION: 1. No acute cardiopulmonary disease. 2. Possible pulmonary nodule evident on the lateral view only. Recommend follow-up unenhanced chest CT. Electronically Signed   By: Lajean Manes M.D.   On: 01/14/2016 16:47   Results for orders placed or performed during the hospital encounter of 12/21/15 (from the past 72 hour(s))  Urinalysis, Routine w reflex microscopic (not at [facility])     Status: Abnormal   Collection Time: 01/12/16 10:04 PM  Result Value Ref Range   Color, Urine AMBER (A) YELLOW    Comment: BIOCHEMICALS MAY BE AFFECTED BY COLOR   APPearance CLOUDY (A) CLEAR   Specific Gravity, Urine 1.030 1.005 - 1.030   pH 6.0 5.0 - 8.0   Glucose, UA NEGATIVE NEGATIVE mg/dL   Hgb urine dipstick MODERATE (A) NEGATIVE   Bilirubin Urine SMALL (A) NEGATIVE   Ketones, ur NEGATIVE NEGATIVE mg/dL   Protein, ur NEGATIVE NEGATIVE mg/dL   Nitrite POSITIVE (A) NEGATIVE   Leukocytes, UA SMALL (A) NEGATIVE  Urine microscopic-add on     Status: Abnormal   Collection Time: 01/12/16 10:04 PM  Result Value  Ref Range   Squamous Epithelial / LPF 0-5 (A) NONE SEEN   WBC, UA 6-30 0 - 5 WBC/hpf   RBC / HPF 6-30 0 - 5 RBC/hpf   Bacteria, UA MANY (A) NONE SEEN  Urine culture     Status: Abnormal (Preliminary result)   Collection Time: 01/12/16 10:06 PM  Result Value Ref Range   Specimen Description URINE, CATHETERIZED    Special Requests NONE    Culture >=100,000 COLONIES/mL GRAM NEGATIVE RODS (A)    Report Status PENDING      General: No acute distress. Vital signs reviewed. Psych: Mood is up beat. More alert today.  Heart: Regular rate and rhythm no rubs murmurs or extra sounds Lungs: Clear to auscultation, breathing unlabored, no rales or wheezes Abdomen: Positive bowel sounds, soft nontender to palpation, nondistended Skin: No evidence of breakdown, no evidence of rash Neurologic: speech clear. Very alert Motor strength:  Right upper extremity: 1+ to 2- delt/bic trace to 1 tricep,wrist, 0 HI Left upper extremity: 1+ bicep,deltoid, trace wrist/tricep, 0 HI Right lower extremity: 2+/5 proximal distal Left lower extremity: 1/5 proximal to distal Sensory exam absent in bilateral lower extremities to light touch Musculoskeletal: No edema. No tenderness.  Assessment/Plan: 1. 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 Physiatrist is providing close team supervision and 24 hour management of active medical problems listed below. Physiatrist and rehab team continue to assess barriers to  discharge/monitor patient progress toward functional and medical goals. FIM: Function - Bathing Bathing activity did not occur: N/A (Night bath- total A) Position: Bed Body parts bathed by patient: Right arm, Left arm, Chest, Abdomen (Hand over hand assist) Body parts bathed by helper: Right arm, Left arm, Chest, Abdomen, Front perineal area, Buttocks, Right upper leg, Left upper  leg, Right lower leg, Left lower leg, Back Bathing not applicable: Right upper leg, Left upper leg, Right lower leg, Left lower leg, Back Assist Level: Touching or steadying assistance(Pt > 75%) (hand over hand assist using bath mit)  Function- Upper Body Dressing/Undressing What is the patient wearing?: Pull over shirt/dress Pull over shirt/dress - Perfomed by patient: Thread/unthread right sleeve, Thread/unthread left sleeve Pull over shirt/dress - Perfomed by helper: Thread/unthread right sleeve, Thread/unthread left sleeve, Put head through opening, Pull shirt over trunk Button up shirt - Perfomed by helper: Thread/unthread right sleeve, Thread/unthread left sleeve, Pull shirt around back, Button/unbutton shirt (Jacket) Assist Level: Touching or steadying assistance(Pt > 75%) Function - Lower Body Dressing/Undressing What is the patient wearing?: Pants, Non-skid slipper socks, Ted Hose Position: Bed Pants- Performed by helper: Thread/unthread right pants leg, Thread/unthread left pants leg, Pull pants up/down, Fasten/unfasten pants Non-skid slipper socks- Performed by helper: Don/doff right sock, Don/doff left sock TED Hose - Performed by helper: Don/doff right TED hose, Don/doff left TED hose Assist for footwear: Dependant Assist for lower body dressing: 2 Helpers  Function - Toileting Toileting activity did not occur: No continent bowel/bladder event Toileting steps completed by patient: Adjust clothing prior to toileting, Performs perineal hygiene, Adjust clothing after toileting (per Berkley Harvey, Nt report) Toileting steps completed by helper: Adjust clothing prior to toileting, Performs perineal hygiene, Adjust clothing after toileting Assist level: Two helpers  Function - Air cabin crew transfer activity did not occur: Safety/medical concerns  Function - Chair/bed transfer Chair/bed transfer method: Lateral scoot Chair/bed transfer assist level: Total assist (Pt <  25%) Chair/bed transfer assistive device: Sliding board Chair/bed transfer details: Tactile cues for placement, Verbal cues for technique, Verbal cues for precautions/safety  Function - Locomotion: Wheelchair Will patient use wheelchair at discharge?: Yes Type: Manual Wheelchair activity did not occur: Safety/medical concerns Max wheelchair distance: 15' Assist Level: Maximal assistance (Pt 25 - 49%) Wheel 50 feet with 2 turns activity did not occur: Safety/medical concerns Assist Level: Dependent (Pt equals 0%) Wheel 150 feet activity did not occur: Safety/medical concerns Assist Level: Dependent (Pt equals 0%) Turns around,maneuvers to table,bed, and toilet,negotiates 3% grade,maneuvers on rugs and over doorsills: No Function - Locomotion: Ambulation Ambulation activity did not occur: Safety/medical concerns Walk 10 feet activity did not occur: Safety/medical concerns Walk 50 feet with 2 turns activity did not occur: Safety/medical concerns Walk 150 feet activity did not occur: Safety/medical concerns Walk 10 feet on uneven surfaces activity did not occur: Safety/medical concerns  Function - Comprehension Comprehension: Auditory Comprehension assist level: Understands complex 90% of the time/cues 10% of the time  Function - Expression Expression: Verbal Expression assist level: Expresses complex 90% of the time/cues < 10% of the time  Function - Social Interaction Social Interaction assist level: Interacts appropriately with others with medication or extra time (anti-anxiety, antidepressant)., Interacts appropriately 90% of the time - Needs monitoring or encouragement for participation or interaction.  Function - Problem Solving Problem solving assist level: Solves basic 90% of the time/requires cueing < 10% of the time  Function - Memory Memory assist level: Recognizes or recalls 90% of the time/requires cueing <  10% of the time Patient normally able to recall (first 3 days  only): Staff names and faces, That he or she is in a hospital, Current season  Medical Problem List and Plan: 1.  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 Cervical collar at all times   - Continue CIR.    -SNF transfer today  -i can see patient in about 4 weeks in office 2.  DVT Prophylaxis/Anticoagulation: dopplers negative  -continue sq lovenox for 12 weeks from 12/18/15  -Vascular ultrasound negative for lower extremity DVT on 5/31 3. Pain Management/fibromyalgia/chronic back pain: Chronic prednisone 10 mg twice a day,  Oxycodone and Zanaflex as needed,    -Lyrica  50mg  tid. Seems to be tolerating well  -robaxin stopped    -dc sports cream (ineffective)  -continue low dose ibuprofen 400mg  bid for a few days 4. Mood: Xanax 0.5 mg twice a day 5. Neuropsych: This patient is capable of making decisions on her own behalf. 6. Skin/Wound Care: Routine skin checks 7. Fluids/Electrolytes/Nutrition: intake overall improved with megace. Wants diet upgrade!  -D1 Thin diet   -  Repleted potassium  -  8. Hypertension. Hydrochlorothiazide 25 mg daily, lisinopril 20 mg daily. Fair control 9. Constipation. Laxative assistance 10. Hyperlipidemia.Lovaza 11. Neurogenic bladder/bowels  continue I/C's   -targeting a PM bowel program but has been inconsistent 12. Hypoalbuminemia- eating much better 13. Escherichia coli UTI  -keflex completed.     LOS (Days) 25 A FACE TO FACE EVALUATION WAS PERFORMED  SWARTZ,ZACHARY T 01/15/2016, 8:41 AM

## 2016-01-15 NOTE — Progress Notes (Signed)
Social Work  Discharge Note  The overall goal for the admission was met for:   Discharge location: No - plan changed to SNF as family cannot meet care needs  Length of Stay: Yes - 25 days  Discharge activity level: No - most initial goals revised - max/ total assist at d/c  Home/community participation: No  Services provided included: MD, RD, PT, OT, SLP, RN, TR, Pharmacy and SW  Financial Services: Medicare  Follow-up services arranged: Other: SN[facility] (or additional information): assisted pt/ sister with completion of Medicaid application  Patient/Family verbalized understanding of follow-up arrangements: Yes  Individual responsible for coordination of the follow-up plan: pt  Confirmed correct DME delivered: NA  [name]

## 2016-01-16 ENCOUNTER — Telehealth: Payer: Self-pay | Admitting: *Deleted

## 2016-01-16 NOTE — Telephone Encounter (Signed)
Spoke with patient's sister regarding transition care to our outpatient clinic.  Sister says patient has been placed in skilled nursing facility at [facility]. This happened yesterday. Currently, patient does not have medical transportation is established.

## 2016-01-17 ENCOUNTER — Encounter: Payer: Self-pay | Admitting: Internal Medicine

## 2016-01-17 ENCOUNTER — Non-Acute Institutional Stay (SKILLED_NURSING_FACILITY): Payer: Medicare Other | Admitting: Internal Medicine

## 2016-01-17 DIAGNOSIS — E46 Unspecified protein-calorie malnutrition: Secondary | ICD-10-CM

## 2016-01-17 DIAGNOSIS — M797 Fibromyalgia: Secondary | ICD-10-CM | POA: Diagnosis not present

## 2016-01-17 DIAGNOSIS — R5381 Other malaise: Secondary | ICD-10-CM

## 2016-01-17 DIAGNOSIS — M4712 Other spondylosis with myelopathy, cervical region: Secondary | ICD-10-CM | POA: Diagnosis not present

## 2016-01-17 DIAGNOSIS — I10 Essential (primary) hypertension: Secondary | ICD-10-CM | POA: Diagnosis not present

## 2016-01-17 DIAGNOSIS — R1310 Dysphagia, unspecified: Secondary | ICD-10-CM | POA: Diagnosis not present

## 2016-01-17 DIAGNOSIS — D62 Acute posthemorrhagic anemia: Secondary | ICD-10-CM | POA: Diagnosis not present

## 2016-01-17 DIAGNOSIS — E871 Hypo-osmolality and hyponatremia: Secondary | ICD-10-CM | POA: Diagnosis not present

## 2016-01-17 DIAGNOSIS — F419 Anxiety disorder, unspecified: Secondary | ICD-10-CM | POA: Diagnosis not present

## 2016-01-17 DIAGNOSIS — K219 Gastro-esophageal reflux disease without esophagitis: Secondary | ICD-10-CM | POA: Diagnosis not present

## 2016-01-17 DIAGNOSIS — N3000 Acute cystitis without hematuria: Secondary | ICD-10-CM
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
## 2016-01-21 LAB — BASIC METABOLIC PANEL
BUN: 9 mg/dL (ref 4–21)
CREATININE: 0.4 mg/dL — AB (ref 0.5–1.1)
Glucose: 99 mg/dL
Potassium: 3.9 mmol/L (ref 3.4–5.3)
Sodium: 136 mmol/L — AB (ref 137–147)

## 2016-01-21 LAB — HEPATIC FUNCTION PANEL
ALK PHOS: 89 U/L (ref 25–125)
ALT: 30 U/L (ref 7–35)
AST: 14 U/L (ref 13–35)
BILIRUBIN, TOTAL: 0.5 mg/dL

## 2016-01-21 LAB — CBC AND DIFFERENTIAL
HCT: 30 % — AB (ref 36–46)
HEMOGLOBIN: 9.7 g/dL — AB (ref 12.0–16.0)
NEUTROS ABS: 5 /uL
PLATELETS: 175 10*3/uL (ref 150–399)
WBC: 6.8 10*3/mL

## 2016-01-22 ENCOUNTER — Non-Acute Institutional Stay (SKILLED_NURSING_FACILITY): Payer: Medicare Other | Admitting: Adult Health

## 2016-01-22 ENCOUNTER — Encounter: Payer: Self-pay | Admitting: Adult Health

## 2016-01-22 DIAGNOSIS — E43 Unspecified severe protein-calorie malnutrition: Secondary | ICD-10-CM | POA: Diagnosis not present
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
## 2016-01-24 ENCOUNTER — Encounter: Payer: Self-pay | Admitting: Adult Health

## 2016-01-24 ENCOUNTER — Other Ambulatory Visit: Payer: Self-pay | Admitting: *Deleted

## 2016-01-24 ENCOUNTER — Non-Acute Institutional Stay (SKILLED_NURSING_FACILITY): Payer: Medicare Other | Admitting: Adult Health

## 2016-01-24 DIAGNOSIS — R400 Somnolence: Secondary | ICD-10-CM | POA: Diagnosis not present

## 2016-01-24 DIAGNOSIS — I952 Hypotension due to drugs: Secondary | ICD-10-CM | POA: Diagnosis not present
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
## 2016-01-25 ENCOUNTER — Encounter: Payer: Medicare Other | Admitting: Physical Medicine & Rehabilitation

## 2016-02-04 ENCOUNTER — Other Ambulatory Visit: Payer: Self-pay

## 2016-02-04 MED ORDER — ALPRAZOLAM 0.25 MG PO TABS: 0.2500 mg | ORAL_TABLET | Freq: Two times a day (BID) | ORAL | Status: DC

## 2016-02-04 MED ORDER — PREGABALIN 50 MG PO CAPS: 50.0000 mg | ORAL_CAPSULE | Freq: Two times a day (BID) | ORAL | Status: DC

## 2016-02-04 NOTE — Telephone Encounter (Signed)
Prescription request was received from:  [place] [address]  Phone: [phone]  Fax: [phone]  Rx for Lyrica 50 mg tablets. #60 with 0 RF.  Rx for Alprazolam 0.25 mg tablets. #30 With 0 RF

## 2016-02-08 ENCOUNTER — Encounter: Payer: Self-pay | Admitting: Adult Health

## 2016-02-08 NOTE — Progress Notes (Signed)
Patient ID: [name], female   DOB: June 21, 1946, 70 y.o.   MRN: [id number]  This encounter was created in error - please disregard. This encounter was created in error - please disregard.

## 2016-02-11 ENCOUNTER — Encounter (HOSPITAL_COMMUNITY): Payer: Self-pay | Admitting: Emergency Medicine

## 2016-02-11 ENCOUNTER — Emergency Department (HOSPITAL_COMMUNITY)
Admission: EM | Admit: 2016-02-11 | Discharge: 2016-02-11 | Disposition: A | Discharge status: Home / Self Care | Payer: Medicare Other | Attending: Emergency Medicine | Admitting: Emergency Medicine

## 2016-02-11 DIAGNOSIS — Z87891 Personal history of nicotine dependence: Secondary | ICD-10-CM | POA: Diagnosis not present

## 2016-02-11 DIAGNOSIS — I10 Essential (primary) hypertension: Secondary | ICD-10-CM | POA: Diagnosis not present

## 2016-02-11 DIAGNOSIS — Z7982 Long term (current) use of aspirin: Secondary | ICD-10-CM | POA: Insufficient documentation

## 2016-02-11 DIAGNOSIS — F329 Major depressive disorder, single episode, unspecified: Secondary | ICD-10-CM | POA: Diagnosis not present

## 2016-02-11 DIAGNOSIS — R197 Diarrhea, unspecified: Secondary | ICD-10-CM | POA: Insufficient documentation

## 2016-02-11 DIAGNOSIS — Z79899 Other long term (current) drug therapy: Secondary | ICD-10-CM | POA: Insufficient documentation

## 2016-02-11 DIAGNOSIS — M1990 Unspecified osteoarthritis, unspecified site: Secondary | ICD-10-CM | POA: Diagnosis not present

## 2016-02-11 DIAGNOSIS — E785 Hyperlipidemia, unspecified: Secondary | ICD-10-CM | POA: Insufficient documentation

## 2016-02-11 LAB — URINALYSIS, ROUTINE W REFLEX MICROSCOPIC
BILIRUBIN URINE: NEGATIVE
Glucose, UA: NEGATIVE mg/dL
Ketones, ur: NEGATIVE mg/dL
Nitrite: NEGATIVE
PH: 7 (ref 5.0–8.0)
Protein, ur: NEGATIVE mg/dL
SPECIFIC GRAVITY, URINE: 1.003 — AB (ref 1.005–1.030)

## 2016-02-11 LAB — COMPREHENSIVE METABOLIC PANEL
ALT: 42 U/L (ref 14–54)
AST: 23 U/L (ref 15–41)
Albumin: 2.6 g/dL — ABNORMAL LOW (ref 3.5–5.0)
Alkaline Phosphatase: 228 U/L — ABNORMAL HIGH (ref 38–126)
Anion gap: 6 (ref 5–15)
BUN: 7 mg/dL (ref 6–20)
CHLORIDE: 106 mmol/L (ref 101–111)
CO2: 25 mmol/L (ref 22–32)
CREATININE: 0.38 mg/dL — AB (ref 0.44–1.00)
Calcium: 8.3 mg/dL — ABNORMAL LOW (ref 8.9–10.3)
GFR calc Af Amer: >60 mL/min (ref >60)
Glucose, Bld: 121 mg/dL — ABNORMAL HIGH (ref 65–99)
Potassium: 4 mmol/L (ref 3.5–5.1)
Sodium: 137 mmol/L (ref 135–145)
Total Bilirubin: 0.4 mg/dL (ref 0.3–1.2)
Total Protein: 5.7 g/dL — ABNORMAL LOW (ref 6.5–8.1)

## 2016-02-11 LAB — CBC WITH DIFFERENTIAL/PLATELET
Basophils Absolute: 0 10*3/uL (ref 0.0–0.1)
Basophils Relative: 0 %
EOS ABS: 0 10*3/uL (ref 0.0–0.7)
Eosinophils Relative: 0 %
HEMATOCRIT: 33 % — AB (ref 36.0–46.0)
HEMOGLOBIN: 10.7 g/dL — AB (ref 12.0–15.0)
LYMPHS ABS: 0.4 10*3/uL — AB (ref 0.7–4.0)
LYMPHS PCT: 4 %
MCH: 31.2 pg (ref 26.0–34.0)
MCHC: 32.4 g/dL (ref 30.0–36.0)
MCV: 96.2 fL (ref 78.0–100.0)
MONOS PCT: 3 %
Monocytes Absolute: 0.3 10*3/uL (ref 0.1–1.0)
NEUTROS PCT: 93 %
Neutro Abs: 9.2 10*3/uL — ABNORMAL HIGH (ref 1.7–7.7)
Platelets: 362 10*3/uL (ref 150–400)
RBC: 3.43 MIL/uL — ABNORMAL LOW (ref 3.87–5.11)
RDW: 13.2 % (ref 11.5–15.5)
WBC: 9.9 10*3/uL (ref 4.0–10.5)

## 2016-02-11 LAB — URINE MICROSCOPIC-ADD ON

## 2016-02-11 LAB — LIPASE, BLOOD: LIPASE: 30 U/L (ref 11–51)

## 2016-02-11 MED ORDER — SODIUM CHLORIDE 0.9 % IV BOLUS (SEPSIS)
500.0000 mL | Freq: Once | INTRAVENOUS | Status: AC
  Administered 2016-02-11: 500 mL via INTRAVENOUS

## 2016-02-11 MED ORDER — FENTANYL CITRATE (PF) 100 MCG/2ML IJ SOLN
12.5000 ug | Freq: Once | INTRAMUSCULAR | Status: AC
  Administered 2016-02-11: 12.5 ug via INTRAVENOUS
  Filled 2016-02-11: qty 2

## 2016-02-11 NOTE — ED Notes (Signed)
Pt continues to deny diarrhea while in dept. Reports all of her stool at the facility has been diarrhea but loose, not watery which is needed for the C.diff sample. Gave pt sandwich at request of MD as PO challenge. Will continue to monitor.

## 2016-02-11 NOTE — Discharge Instructions (Signed)
Chronic Diarrhea Diarrhea is frequent loose and watery bowel movements. It can cause you to feel weak and dehydrated. Dehydration can cause you to become tired and thirsty and to have a dry mouth, decreased urination, and dark yellow urine. Diarrhea is a sign of another problem, most often an infection that will not last long. In most cases, diarrhea lasts 2-3 days. Diarrhea that lasts longer than 4 weeks is called long-lasting (chronic) diarrhea. It is important to treat your diarrhea as directed by your health care provider to lessen or prevent future episodes of diarrhea.  CAUSES  There are many causes of chronic diarrhea. The following are some possible causes:   Gastrointestinal infections caused by viruses, bacteria, or parasites.   Food poisoning or food allergies.   Certain medicines, such as antibiotics, chemotherapy, and laxatives.   Artificial sweeteners and fructose.   Digestive disorders, such as celiac disease and inflammatory bowel diseases.   Irritable bowel syndrome.  Some disorders of the pancreas.  Disorders of the thyroid.  Reduced blood flow to the intestines.  Cancer. Sometimes the cause of chronic diarrhea is unknown. RISK FACTORS  Having a severely weakened immune system, such as from HIV or AIDS.   Taking certain types of cancer-fighting drugs (such as with chemotherapy) or other medicines.   Having had a recent organ transplant.   Having a portion of the stomach or small bowel removed.   Traveling to countries where food and water supplies are often contaminated.  SYMPTOMS  In addition to frequent, loose stools, diarrhea may cause:   Cramping.   Abdominal pain.   Nausea.   Fever.  Fatigue.  Urgent need to use the bathroom.  Loss of bowel control. DIAGNOSIS  Your health care provider must take a careful history and perform a physical exam. Tests given are based on your symptoms and history. Tests may include:   Blood or  stool tests. Three or more stool samples may be examined. Stool cultures may be used to test for bacteria or parasites.   X-rays.   A procedure in which a thin tube is inserted into the mouth or rectum (endoscopy). This allows the health care provider to look inside the intestine.  TREATMENT   Treatment is aimed at correcting the cause of the diarrhea when possible.  Diarrhea caused by an infection can often be treated with antibiotic medicines.  Diarrhea not caused by an infection may require you to take long-term medicine or have surgery. Specific treatment should be discussed with your health care provider.  If the cause cannot be determined, treatment aims to relieve symptoms and prevent dehydration. Serious health problems can occur if you do not maintain proper fluid levels. Treatment may include:  Taking an oral rehydration solution (ORS).  Not drinking beverages that contain caffeine (such as tea, coffee, and soft drinks).  Not drinking alcohol.  Maintaining well-balanced nutrition to help you recover faster. HOME CARE INSTRUCTIONS   Drink enough fluids to keep urine clear or pale yellow. Drink 1 cup (8 oz) of fluid for each diarrhea episode. Avoid fluids that contain simple sugars, fruit juices, whole milk products, and sodas. Hydrate with an ORS. You may purchase the ORS or prepare it at home by mixing the following ingredients together:   - tsp (1.7-3  mL) table salt.   tsp (3  mL) baking soda.   tsp (1.7 mL) salt substitute containing potassium chloride.  1 tbsp (20 mL) sugar.  4.2 c (1 L) of water.     Certain foods and beverages may increase the speed at which food moves through the gastrointestinal (GI) tract. These foods and beverages should be avoided. They include:  Caffeinated and alcoholic beverages.  High-fiber foods, such as raw fruits and vegetables, nuts, seeds, and whole grain breads and cereals.  Foods and beverages sweetened with sugar  alcohols, such as xylitol, sorbitol, and mannitol.   Some foods may be well tolerated and may help thicken stool. These include:  Starchy foods, such as rice, toast, pasta, low-sugar cereal, oatmeal, grits, baked potatoes, crackers, and bagels.  Bananas.  Applesauce.  Add probiotic-rich foods to help increase healthy bacteria in the GI tract. These include yogurt and fermented milk products.  Wash your hands well after each diarrhea episode.  Only take over-the-counter or prescription medicines as directed by your health care provider.  Take a warm bath to relieve any burning or pain from frequent diarrhea episodes. SEEK MEDICAL CARE IF:   You are not urinating as often.  Your urine is a dark color.  You become very tired or dizzy.  You have severe pain in the abdomen or rectum.  Your have blood or pus in your stools.  Your stools look black and tarry. SEEK IMMEDIATE MEDICAL CARE IF:   You are unable to keep fluids down.  You have persistent vomiting.  You have blood in your stool.  Your stools are black and tarry.  You do not urinate in 6-8 hours, or there is only a small amount of very dark urine.  You have abdominal pain that increases or localizes.  You have weakness, dizziness, confusion, or lightheadedness.  You have a severe headache.  Your diarrhea gets worse or does not get better.  You have a fever or persistent symptoms for more than 2-3 days.  You have a fever and your symptoms suddenly get worse. MAKE SURE YOU:   Understand these instructions.  Will watch your condition.  Will get help right away if you are not doing well or get worse.   This information is not intended to replace advice given to you by your health care provider. Make sure you discuss any questions you have with your health care provider.   Document Released: 10/04/2003 Document Revised: 07/19/2013 Document Reviewed: 01/06/2013 Elsevier Interactive Patient Education 2016  Elsevier Inc.  

## 2016-02-11 NOTE — ED Notes (Addendum)
Patient reports diarrhea after every meal.  Reports she currently receives pureed foods.  Denies abdominal pain.  Patient presents with indwelling foley catheter from facility.

## 2016-02-11 NOTE — ED Notes (Addendum)
Per PTAR, Pt from [facility] c/o diarrhea. Pt in placement since spinal surgeries. Pt has had diarrhea for three weeks, pt becoming weaker and losing weight. A&Ox4. Pt has hx of fibromyalgia. Pt has boots on feet due to pressure ulcers. Pt has foley catheter. No recent antibiotic use, no new s/s of infection.

## 2016-02-11 NOTE — ED Notes (Signed)
Patient picked up py PTAR, report given to Estill Bamberg, LPN at [facility].
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
## 2016-02-13 ENCOUNTER — Non-Acute Institutional Stay (SKILLED_NURSING_FACILITY): Payer: Medicare Other | Admitting: Adult Health

## 2016-02-13 ENCOUNTER — Encounter: Payer: Self-pay | Admitting: Adult Health

## 2016-02-13 DIAGNOSIS — D62 Acute posthemorrhagic anemia: Secondary | ICD-10-CM | POA: Diagnosis not present

## 2016-02-13 DIAGNOSIS — E43 Unspecified severe protein-calorie malnutrition: Secondary | ICD-10-CM | POA: Diagnosis not present

## 2016-02-13 DIAGNOSIS — R1310 Dysphagia, unspecified: Secondary | ICD-10-CM

## 2016-02-13 DIAGNOSIS — F419 Anxiety disorder, unspecified: Secondary | ICD-10-CM

## 2016-02-13 DIAGNOSIS — K219 Gastro-esophageal reflux disease without esophagitis: Secondary | ICD-10-CM

## 2016-02-13 DIAGNOSIS — M797 Fibromyalgia: Secondary | ICD-10-CM

## 2016-02-13 DIAGNOSIS — R531 Weakness: Secondary | ICD-10-CM

## 2016-02-13 DIAGNOSIS — M4712 Other spondylosis with myelopathy, cervical region: Secondary | ICD-10-CM | POA: Diagnosis not present

## 2016-02-13 LAB — URINE CULTURE
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
## 2016-02-18 ENCOUNTER — Non-Acute Institutional Stay (SKILLED_NURSING_FACILITY): Payer: Medicare Other | Admitting: Adult Health

## 2016-02-18 ENCOUNTER — Encounter: Payer: Self-pay | Admitting: Adult Health

## 2016-02-18 DIAGNOSIS — M549 Dorsalgia, unspecified: Secondary | ICD-10-CM

## 2016-02-18 DIAGNOSIS — G8252 Quadriplegia, C1-C4 incomplete: Secondary | ICD-10-CM

## 2016-02-18 DIAGNOSIS — G8929 Other chronic pain: Secondary | ICD-10-CM

## 2016-02-18 DIAGNOSIS — G8254 Quadriplegia, C5-C7 incomplete: Secondary | ICD-10-CM

## 2016-02-18 DIAGNOSIS — M4712 Other spondylosis with myelopathy, cervical region: Secondary | ICD-10-CM

## 2016-02-18 DIAGNOSIS — G8250 Quadriplegia, unspecified: Secondary | ICD-10-CM
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
## 2016-02-25 ENCOUNTER — Ambulatory Visit (INDEPENDENT_AMBULATORY_CARE_PROVIDER_SITE_OTHER): Payer: Medicare Other | Admitting: Family Medicine

## 2016-02-25 ENCOUNTER — Encounter: Payer: Self-pay | Admitting: Family Medicine

## 2016-02-25 VITALS — BP 112/66 | HR 104 | Temp 97.9°F

## 2016-02-25 DIAGNOSIS — I10 Essential (primary) hypertension: Secondary | ICD-10-CM

## 2016-02-25 DIAGNOSIS — M791 Myalgia: Secondary | ICD-10-CM

## 2016-02-25 DIAGNOSIS — M609 Myositis, unspecified: Secondary | ICD-10-CM | POA: Diagnosis not present

## 2016-02-25 DIAGNOSIS — G8250 Quadriplegia, unspecified: Secondary | ICD-10-CM

## 2016-02-25 DIAGNOSIS — G8929 Other chronic pain: Secondary | ICD-10-CM

## 2016-02-25 DIAGNOSIS — F411 Generalized anxiety disorder: Secondary | ICD-10-CM

## 2016-02-25 DIAGNOSIS — M549 Dorsalgia, unspecified: Secondary | ICD-10-CM

## 2016-02-25 DIAGNOSIS — M4712 Other spondylosis with myelopathy, cervical region: Secondary | ICD-10-CM | POA: Diagnosis not present

## 2016-02-25 DIAGNOSIS — IMO0001 Reserved for inherently not codable concepts without codable children: Secondary | ICD-10-CM

## 2016-02-25 MED ORDER — MEGESTROL ACETATE 20 MG PO TABS: 20.0000 mg | ORAL_TABLET | Freq: Every day | ORAL | 11 refills | Status: DC

## 2016-02-25 MED ORDER — MIDODRINE HCL 5 MG PO TABS: 5.0000 mg | ORAL_TABLET | Freq: Every day | ORAL | 11 refills | Status: DC

## 2016-02-25 MED ORDER — ALPRAZOLAM 0.25 MG PO TABS: 0.2500 mg | ORAL_TABLET | Freq: Two times a day (BID) | ORAL | 5 refills | Status: AC

## 2016-02-25 MED ORDER — PREDNISONE 10 MG PO TABS: 10.0000 mg | ORAL_TABLET | Freq: Every day | ORAL | 11 refills | Status: DC

## 2016-02-25 MED ORDER — NAPROXEN SODIUM 220 MG PO TABS: 220.0000 mg | ORAL_TABLET | Freq: Two times a day (BID) | ORAL | 0 refills | Status: DC

## 2016-02-25 NOTE — Progress Notes (Signed)
Pre visit review using our clinic review tool, if applicable. No additional management support is needed unless otherwise documented below in the visit note. Pt unable to stand and weigh.   

## 2016-02-25 NOTE — Progress Notes (Signed)
   Subjective:    Patient ID: [name], female    DOB: 1946/01/09, 70 y.o.   MRN: [id number]  HPI Here with her sister to follow up after a hospital stay from 12-21-15 to 01-15-16 and then a rehab stay at [facility] until 02-18-16. She had rapidly advancing weakness in all 4 extremities from cervical spinal stenosis and she had cervical spine surgery to relieve this. She has had a long and slow recovery and she is still quite weak in all extremities. She cannot stand and she spends her time either lying or sitting. She still has aching pains all over her body from the fibromyalgia, but she cannot tolerate any form of narcotics. Her appetite is poor, but her sister is giving her 3 cans of Ensure daily. Her bowel and bladder function seems to be normal. She is now living with her sister and they require DME and a home health referral.    Review of Systems  Constitutional: Positive for fatigue. Negative for fever.  Respiratory: Negative.   Cardiovascular: Negative.   Gastrointestinal: Negative.   Genitourinary: Negative.   Neurological: Positive for weakness and headaches. Negative for seizures, syncope, speech difficulty and numbness.  Psychiatric/Behavioral: Negative for agitation, confusion, decreased concentration, dysphoric mood and hallucinations. The patient is nervous/anxious.        Objective:   Physical Exam  Constitutional: She is oriented to person, place, and time.  Alert, lying on a stretcher  Neck: No thyromegaly present.  Cardiovascular: Normal rate, regular rhythm, normal heart sounds and intact distal pulses.   Pulmonary/Chest: Effort normal and breath sounds normal.  Abdominal: Soft. Bowel sounds are normal. She exhibits no distension and no mass. There is no tenderness. There is no rebound and no guarding.  Musculoskeletal: She exhibits edema.  Lymphadenopathy:    She has no cervical adenopathy.  Neurological: She is alert and oriented to person, place, and  time.  Psychiatric: She has a normal mood and affect. Her behavior is normal. Judgment and thought content normal.          Assessment & Plan:  She has returned home from an extensive hospital course for cervical spine surgery and a rehab stay. She is very weak and deconditioned. Try Megace to improve her appetite. Refer to [facility] for OT and PT. Wrote for a hospital bed and a wheelchair. Try Aleve bid for pain.  Laurey Morale, MD

## 2016-02-26 ENCOUNTER — Telehealth: Payer: Self-pay | Admitting: Family Medicine

## 2016-02-26 ENCOUNTER — Encounter: Payer: Self-pay | Admitting: Family Medicine

## 2016-02-26 DIAGNOSIS — L89159 Pressure ulcer of sacral region, unspecified stage: Secondary | ICD-10-CM | POA: Insufficient documentation

## 2016-02-26 NOTE — Telephone Encounter (Signed)
Phone number for gel overlap is [phone].

## 2016-02-26 NOTE — Telephone Encounter (Signed)
[facility] called, we need to fax script for equipment order bed, wheel chair, & hoyer lift to [place] for this. Also a script for gel overlap to phone number [phone], this is for the bed. Pt has a pressure sore on buttocks, okay per Dr. Sarajane Jews to give verbal to evaluate and treat this.

## 2016-02-26 NOTE — Telephone Encounter (Signed)
Sree with [facility] would also like to add a Education officer, museum request for pt.

## 2016-02-26 NOTE — Telephone Encounter (Signed)
These orders were written

## 2016-02-26 NOTE — Telephone Encounter (Signed)
Phone number for [facility] [phone] and the fax number is [phone].

## 2016-02-27 NOTE — Telephone Encounter (Signed)
I spoke with [name] and gave verbal order for social worker.

## 2016-02-27 NOTE — Telephone Encounter (Signed)
I faxed orders for supplies to [facility] for medical supplies, was told to send the gel overlap also and then if they can't get will call another supply company.

## 2016-02-28 ENCOUNTER — Telehealth: Payer: Self-pay | Admitting: Family Medicine

## 2016-02-28 NOTE — Telephone Encounter (Signed)
I left a voice message for [facility] with below information.

## 2016-02-28 NOTE — Telephone Encounter (Signed)
I agree with verbal order.  

## 2016-02-28 NOTE — Telephone Encounter (Signed)
Okay to receive orders from [facility] of care: OT 1x a week for 5 weeks for incontinence training, pain management, pressure ulcer prevention, ADL, caregiver training on HOYER lift, fall prevention, health promotion, infection control and theraputic exercise.  Okay to discharge when goals are met or at max potential.  Verbal order is okay and leave

## 2016-03-03 ENCOUNTER — Telehealth: Payer: Self-pay | Admitting: Family Medicine

## 2016-03-03 NOTE — Telephone Encounter (Signed)
Apply the barrier cream only

## 2016-03-03 NOTE — Telephone Encounter (Signed)
[facility] got an order for a dressing for the stage 2 pressure ulcer. However, that did not work. It bunched up and caused it to increase in size.  [facility] needs a order for barrier cream only

## 2016-03-03 NOTE — Telephone Encounter (Signed)
I spoke with [facility] and gave verbal for below order.

## 2016-03-03 NOTE — Telephone Encounter (Signed)
Order okay? 

## 2016-03-05 ENCOUNTER — Telehealth: Payer: Self-pay | Admitting: Family Medicine

## 2016-03-05 NOTE — Telephone Encounter (Signed)
° °  Sree from [facility] home [facility] call to ask for extension for physical therapy from 03/17/16 to 04/05/16 every week for 1 time a week.  He said pt was not participating much the first week and that is why he asking for the extension

## 2016-03-05 NOTE — Telephone Encounter (Signed)
Per Dr. Sarajane Jews okay to give verbal. I did speak with [facility] with [facility] and gave below verbal order.

## 2016-03-10 DIAGNOSIS — I10 Essential (primary) hypertension: Secondary | ICD-10-CM

## 2016-03-10 DIAGNOSIS — L89152 Pressure ulcer of sacral region, stage 2: Secondary | ICD-10-CM

## 2016-03-10 DIAGNOSIS — T8189XD Other complications of procedures, not elsewhere classified, subsequent encounter: Secondary | ICD-10-CM | POA: Diagnosis not present

## 2016-03-10 DIAGNOSIS — G8252 Quadriplegia, C1-C4 incomplete: Secondary | ICD-10-CM | POA: Diagnosis not present

## 2016-03-10 DIAGNOSIS — F411 Generalized anxiety disorder: Secondary | ICD-10-CM

## 2016-03-10 DIAGNOSIS — M4712 Other spondylosis with myelopathy, cervical region: Secondary | ICD-10-CM

## 2016-03-13 ENCOUNTER — Telehealth: Payer: Self-pay | Admitting: Family Medicine

## 2016-03-13 NOTE — Telephone Encounter (Signed)
Error/njr °

## 2016-03-24 ENCOUNTER — Telehealth: Payer: Self-pay | Admitting: Family Medicine

## 2016-03-24 ENCOUNTER — Other Ambulatory Visit: Payer: Self-pay | Admitting: Family Medicine

## 2016-03-24 MED ORDER — PREGABALIN 100 MG PO CAPS: 100.0000 mg | ORAL_CAPSULE | Freq: Three times a day (TID) | ORAL | 5 refills | Status: DC

## 2016-03-24 NOTE — Telephone Encounter (Signed)
Clabe Seal from [facility] [phone]) she needs verbal orders for speech theraphy, increase her nursing visits and to get an appointment with the wound center.

## 2016-03-24 NOTE — Telephone Encounter (Signed)
Please call in all these orders

## 2016-03-24 NOTE — Telephone Encounter (Signed)
Call in Lyrica 100 mg tid #90 with 5 rf

## 2016-03-24 NOTE — Telephone Encounter (Signed)
I spoke with [name] and gave the below verbal orders, also requested a order for wound vac, pt cannot get into the wound care clinic until 04/14/2016. Per Dr. Sarajane Jews okay to order the wound vac as well and I gave

## 2016-03-24 NOTE — Telephone Encounter (Signed)
Rx called in 

## 2016-03-24 NOTE — Telephone Encounter (Signed)
Pt had neck surgery and states she was given  lyrica in the hospital. Pt would like dr Sarajane Jews to sent in a refill to  [facility]  (new to pt)  Pt states this med helps so much with her pain. Pt is still paralyzed, pt can move hands now.

## 2016-03-26 ENCOUNTER — Telehealth: Payer: Self-pay | Admitting: Family Medicine

## 2016-03-26 NOTE — Telephone Encounter (Signed)
[place] therapist from [facility] [phone])  She would like to recommend a modified barioum swallow test. She's not eating and complains about food getting hung. She has weight loss. She didn't eat for her during the evaluation.

## 2016-03-26 NOTE — Telephone Encounter (Signed)
Per Dr. Sarajane Jews okay to give a verbal order and I did leave this information on [facility]'s number.

## 2016-03-27 ENCOUNTER — Other Ambulatory Visit: Payer: Self-pay | Admitting: Family Medicine

## 2016-03-27 DIAGNOSIS — R1310 Dysphagia, unspecified: Secondary | ICD-10-CM

## 2016-03-31 ENCOUNTER — Inpatient Hospital Stay (HOSPITAL_COMMUNITY): Payer: Medicare Other

## 2016-03-31 ENCOUNTER — Emergency Department (HOSPITAL_COMMUNITY): Payer: Medicare Other

## 2016-03-31 ENCOUNTER — Inpatient Hospital Stay (HOSPITAL_COMMUNITY)
Admission: EM | Admit: 2016-03-31 | Discharge: 2016-04-05 | DRG: 570 | Disposition: A | Discharge status: Home Health | Payer: Medicare Other | Attending: Internal Medicine | Admitting: Internal Medicine

## 2016-03-31 ENCOUNTER — Encounter (HOSPITAL_COMMUNITY): Payer: Self-pay | Admitting: *Deleted

## 2016-03-31 DIAGNOSIS — R627 Adult failure to thrive: Secondary | ICD-10-CM | POA: Diagnosis present

## 2016-03-31 DIAGNOSIS — E43 Unspecified severe protein-calorie malnutrition: Secondary | ICD-10-CM | POA: Diagnosis present

## 2016-03-31 DIAGNOSIS — L89154 Pressure ulcer of sacral region, stage 4: Principal | ICD-10-CM | POA: Diagnosis present

## 2016-03-31 DIAGNOSIS — I10 Essential (primary) hypertension: Secondary | ICD-10-CM | POA: Diagnosis present

## 2016-03-31 DIAGNOSIS — L03312 Cellulitis of back [any part except buttock]: Secondary | ICD-10-CM | POA: Diagnosis present

## 2016-03-31 DIAGNOSIS — Z881 Allergy status to other antibiotic agents status: Secondary | ICD-10-CM

## 2016-03-31 DIAGNOSIS — Z885 Allergy status to narcotic agent status: Secondary | ICD-10-CM

## 2016-03-31 DIAGNOSIS — L89159 Pressure ulcer of sacral region, unspecified stage: Secondary | ICD-10-CM | POA: Diagnosis present

## 2016-03-31 DIAGNOSIS — Z7401 Bed confinement status: Secondary | ICD-10-CM

## 2016-03-31 DIAGNOSIS — B9620 Unspecified Escherichia coli [E. coli] as the cause of diseases classified elsewhere: Secondary | ICD-10-CM | POA: Diagnosis present

## 2016-03-31 DIAGNOSIS — L089 Local infection of the skin and subcutaneous tissue, unspecified: Secondary | ICD-10-CM | POA: Diagnosis present

## 2016-03-31 DIAGNOSIS — E860 Dehydration: Secondary | ICD-10-CM | POA: Diagnosis not present

## 2016-03-31 DIAGNOSIS — R1310 Dysphagia, unspecified: Secondary | ICD-10-CM | POA: Diagnosis present

## 2016-03-31 DIAGNOSIS — E785 Hyperlipidemia, unspecified: Secondary | ICD-10-CM | POA: Diagnosis present

## 2016-03-31 DIAGNOSIS — R532 Functional quadriplegia: Secondary | ICD-10-CM | POA: Diagnosis present

## 2016-03-31 DIAGNOSIS — K219 Gastro-esophageal reflux disease without esophagitis: Secondary | ICD-10-CM | POA: Diagnosis present

## 2016-03-31 DIAGNOSIS — Z681 Body mass index (BMI) 19 or less, adult: Secondary | ICD-10-CM

## 2016-03-31 DIAGNOSIS — M533 Sacrococcygeal disorders, not elsewhere classified: Secondary | ICD-10-CM | POA: Diagnosis present

## 2016-03-31 DIAGNOSIS — Z79899 Other long term (current) drug therapy: Secondary | ICD-10-CM

## 2016-03-31 DIAGNOSIS — M797 Fibromyalgia: Secondary | ICD-10-CM | POA: Diagnosis present

## 2016-03-31 DIAGNOSIS — Z87891 Personal history of nicotine dependence: Secondary | ICD-10-CM | POA: Diagnosis not present

## 2016-03-31 DIAGNOSIS — R509 Fever, unspecified: Secondary | ICD-10-CM | POA: Diagnosis present

## 2016-03-31 DIAGNOSIS — F419 Anxiety disorder, unspecified: Secondary | ICD-10-CM | POA: Diagnosis present

## 2016-03-31 DIAGNOSIS — Z886 Allergy status to analgesic agent status: Secondary | ICD-10-CM

## 2016-03-31 DIAGNOSIS — Z8249 Family history of ischemic heart disease and other diseases of the circulatory system: Secondary | ICD-10-CM | POA: Diagnosis not present

## 2016-03-31 DIAGNOSIS — Z8261 Family history of arthritis: Secondary | ICD-10-CM | POA: Diagnosis not present

## 2016-03-31 DIAGNOSIS — G959 Disease of spinal cord, unspecified: Secondary | ICD-10-CM | POA: Diagnosis present

## 2016-03-31 DIAGNOSIS — Z7189 Other specified counseling: Secondary | ICD-10-CM

## 2016-03-31 DIAGNOSIS — E440 Moderate protein-calorie malnutrition: Secondary | ICD-10-CM | POA: Diagnosis not present

## 2016-03-31 DIAGNOSIS — Z515 Encounter for palliative care: Secondary | ICD-10-CM | POA: Diagnosis not present

## 2016-03-31 DIAGNOSIS — B965 Pseudomonas (aeruginosa) (mallei) (pseudomallei) as the cause of diseases classified elsewhere: Secondary | ICD-10-CM | POA: Diagnosis present

## 2016-03-31 DIAGNOSIS — Z808 Family history of malignant neoplasm of other organs or systems: Secondary | ICD-10-CM | POA: Diagnosis not present

## 2016-03-31 DIAGNOSIS — Z7982 Long term (current) use of aspirin: Secondary | ICD-10-CM

## 2016-03-31 DIAGNOSIS — Z888 Allergy status to other drugs, medicaments and biological substances status: Secondary | ICD-10-CM

## 2016-03-31 DIAGNOSIS — Z841 Family history of disorders of kidney and ureter: Secondary | ICD-10-CM

## 2016-03-31 DIAGNOSIS — E876 Hypokalemia: Secondary | ICD-10-CM | POA: Diagnosis not present

## 2016-03-31 DIAGNOSIS — M869 Osteomyelitis, unspecified: Secondary | ICD-10-CM

## 2016-03-31 DIAGNOSIS — L8994 Pressure ulcer of unspecified site, stage 4: Secondary | ICD-10-CM | POA: Diagnosis not present

## 2016-03-31 DIAGNOSIS — M8618 Other acute osteomyelitis, other site: Secondary | ICD-10-CM

## 2016-03-31 DIAGNOSIS — L98429 Non-pressure chronic ulcer of back with unspecified severity: Secondary | ICD-10-CM | POA: Diagnosis present

## 2016-03-31 DIAGNOSIS — L8990 Pressure ulcer of unspecified site, unspecified stage: Secondary | ICD-10-CM

## 2016-03-31 LAB — I-STAT CHEM 8, ED
BUN: 9 mg/dL (ref 6–20)
CALCIUM ION: 1.06 mmol/L — AB (ref 1.15–1.40)
CHLORIDE: 99 mmol/L — AB (ref 101–111)
Creatinine, Ser: 0.3 mg/dL — ABNORMAL LOW (ref 0.44–1.00)
Glucose, Bld: 99 mg/dL (ref 65–99)
HEMATOCRIT: 33 % — AB (ref 36.0–46.0)
Hemoglobin: 11.2 g/dL — ABNORMAL LOW (ref 12.0–15.0)
POTASSIUM: 3.2 mmol/L — AB (ref 3.5–5.1)
SODIUM: 135 mmol/L (ref 135–145)
TCO2: 24 mmol/L (ref 0–100)

## 2016-03-31 LAB — BASIC METABOLIC PANEL
ANION GAP: 6 (ref 5–15)
BUN: 11 mg/dL (ref 6–20)
CALCIUM: 7.6 mg/dL — AB (ref 8.9–10.3)
CHLORIDE: 104 mmol/L (ref 101–111)
CO2: 24 mmol/L (ref 22–32)
Creatinine, Ser: <0.3 mg/dL — ABNORMAL LOW (ref 0.44–1.00)
GLUCOSE: 101 mg/dL — AB (ref 65–99)
Potassium: 3.2 mmol/L — ABNORMAL LOW (ref 3.5–5.1)
Sodium: 134 mmol/L — ABNORMAL LOW (ref 135–145)

## 2016-03-31 LAB — CBC WITH DIFFERENTIAL/PLATELET
BASOS ABS: 0 10*3/uL (ref 0.0–0.1)
Basophils Relative: 0 %
Eosinophils Absolute: 0 10*3/uL (ref 0.0–0.7)
Eosinophils Relative: 1 %
HEMATOCRIT: 32.5 % — AB (ref 36.0–46.0)
HEMOGLOBIN: 10.8 g/dL — AB (ref 12.0–15.0)
LYMPHS PCT: 9 %
Lymphs Abs: 0.6 10*3/uL — ABNORMAL LOW (ref 0.7–4.0)
MCH: 29.8 pg (ref 26.0–34.0)
MCHC: 33.2 g/dL (ref 30.0–36.0)
MCV: 89.8 fL (ref 78.0–100.0)
MONO ABS: 0.7 10*3/uL (ref 0.1–1.0)
MONOS PCT: 10 %
NEUTROS ABS: 5.6 10*3/uL (ref 1.7–7.7)
Neutrophils Relative %: 80 %
Platelets: 285 10*3/uL (ref 150–400)
RBC: 3.62 MIL/uL — ABNORMAL LOW (ref 3.87–5.11)
RDW: 14 % (ref 11.5–15.5)
WBC: 7 10*3/uL (ref 4.0–10.5)

## 2016-03-31 LAB — I-STAT CG4 LACTIC ACID, ED: LACTIC ACID, VENOUS: 0.96 mmol/L (ref 0.5–1.9)

## 2016-03-31 MED ORDER — POTASSIUM CHLORIDE CRYS ER 20 MEQ PO TBCR
40.0000 meq | EXTENDED_RELEASE_TABLET | ORAL | Status: AC
  Administered 2016-03-31: 40 meq via ORAL
  Filled 2016-03-31: qty 2

## 2016-03-31 MED ORDER — DEXTROSE-NACL 5-0.9 % IV SOLN
INTRAVENOUS | Status: DC
  Administered 2016-03-31 – 2016-04-03 (×4): via INTRAVENOUS

## 2016-03-31 MED ORDER — DEXTROSE 5 % IV SOLN
1.0000 g | Freq: Three times a day (TID) | INTRAVENOUS | Status: DC
  Administered 2016-03-31 – 2016-04-02 (×7): 1 g via INTRAVENOUS
  Filled 2016-03-31 (×9): qty 1

## 2016-03-31 MED ORDER — FENTANYL CITRATE (PF) 100 MCG/2ML IJ SOLN
12.5000 ug | INTRAMUSCULAR | Status: DC | PRN
  Administered 2016-03-31 – 2016-04-04 (×8): 12.5 ug via INTRAVENOUS
  Administered 2016-04-05: 50 ug via INTRAVENOUS
  Filled 2016-03-31 (×10): qty 2

## 2016-03-31 MED ORDER — ALPRAZOLAM 0.25 MG PO TABS
0.2500 mg | ORAL_TABLET | Freq: Two times a day (BID) | ORAL | Status: DC
  Administered 2016-03-31 – 2016-04-05 (×10): 0.25 mg via ORAL
  Filled 2016-03-31 (×12): qty 1

## 2016-03-31 MED ORDER — VANCOMYCIN HCL IN DEXTROSE 1-5 GM/200ML-% IV SOLN
1000.0000 mg | Freq: Once | INTRAVENOUS | Status: AC
Start: 2016-03-31 — End: 2016-03-31
  Administered 2016-03-31: 1000 mg via INTRAVENOUS
  Filled 2016-03-31: qty 200

## 2016-03-31 MED ORDER — ENOXAPARIN SODIUM 40 MG/0.4ML ~~LOC~~ SOLN
40.0000 mg | SUBCUTANEOUS | Status: DC
  Administered 2016-04-03 – 2016-04-05 (×3): 40 mg via SUBCUTANEOUS
  Filled 2016-03-31 (×4): qty 0.4

## 2016-03-31 MED ORDER — ONDANSETRON HCL 4 MG/2ML IJ SOLN
4.0000 mg | Freq: Four times a day (QID) | INTRAMUSCULAR | Status: DC | PRN
  Administered 2016-04-04: 4 mg via INTRAVENOUS
  Filled 2016-03-31: qty 2

## 2016-03-31 MED ORDER — ASPIRIN EC 81 MG PO TBEC
81.0000 mg | DELAYED_RELEASE_TABLET | Freq: Every day | ORAL | Status: DC
  Administered 2016-04-01 – 2016-04-05 (×5): 81 mg via ORAL
  Filled 2016-03-31 (×5): qty 1

## 2016-03-31 MED ORDER — OMEGA-3-ACID ETHYL ESTERS 1 G PO CAPS
1.0000 g | ORAL_CAPSULE | Freq: Every day | ORAL | Status: DC
  Filled 2016-03-31 (×4): qty 1

## 2016-03-31 MED ORDER — ONDANSETRON HCL 4 MG PO TABS: 4.0000 mg | ORAL_TABLET | Freq: Four times a day (QID) | ORAL | Status: DC | PRN

## 2016-03-31 MED ORDER — VANCOMYCIN HCL 500 MG IV SOLR
500.0000 mg | Freq: Two times a day (BID) | INTRAVENOUS | Status: DC
  Administered 2016-04-01: 500 mg via INTRAVENOUS
  Filled 2016-03-31 (×2): qty 500

## 2016-03-31 MED ORDER — VITAMIN B-12 1000 MCG PO TABS
1000.0000 ug | ORAL_TABLET | Freq: Every day | ORAL | Status: DC
  Administered 2016-04-01 – 2016-04-05 (×5): 1000 ug via ORAL
  Filled 2016-03-31 (×5): qty 1

## 2016-03-31 MED ORDER — MAGNESIUM OXIDE 400 (241.3 MG) MG PO TABS
400.0000 mg | ORAL_TABLET | Freq: Every day | ORAL | Status: DC
Start: 2016-03-31 — End: 2016-04-05
  Administered 2016-04-01 – 2016-04-03 (×3): 400 mg via ORAL
  Filled 2016-03-31 (×3): qty 1

## 2016-03-31 MED ORDER — FAMOTIDINE 20 MG PO TABS
20.0000 mg | ORAL_TABLET | Freq: Every day | ORAL | Status: DC
  Administered 2016-04-01 – 2016-04-05 (×5): 20 mg via ORAL
  Filled 2016-03-31 (×5): qty 1

## 2016-03-31 NOTE — Progress Notes (Signed)
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  She is total care requiring to be fed and dependent on all her adls and toileting needs. Patient is able to let someone know when she has to go to the bathroom but wears a diaper in case she has an accident.

## 2016-03-31 NOTE — Progress Notes (Addendum)
Pharmacy Antibiotic Note  [name] is a 70 y.o. female admitted on 03/31/2016 with sepsis. Worsening sacral pressure ulcer and fever. Vancomycin 1g ordered in the ED and pharmacy has been consulted for Cefepime dosing. SCr is wnl. CrCl ~48ml/min/normalized. Recent weight was 52kg.  Plan: Cefepime 1g IV q8h. Follow up renal fxn, culture results, and clinical course. F/u for addition of Vancomycin.     Temp (24hrs), Avg:99 F (37.2 C), Min:99 F (37.2 C), Max:99 F (37.2 C)  No results for input(s): WBC, CREATININE, LATICACIDVEN, VANCOTROUGH, VANCOPEAK, VANCORANDOM, GENTTROUGH, GENTPEAK, GENTRANDOM, TOBRATROUGH, TOBRAPEAK, TOBRARND, AMIKACINPEAK, AMIKACINTROU, AMIKACIN in the last 168 hours.  CrCl cannot be calculated (Unknown ideal weight.).    Allergies  Allergen Reactions  . Codeine Nausea And Vomiting and Other (See Comments)    "head explode"   . Tylenol [Acetaminophen]     Headaches per pt  . Ciprofloxacin Nausea And Vomiting  . Escitalopram Oxalate Nausea And Vomiting  . Gabapentin Nausea And Vomiting  . Hydrocodone Nausea And Vomiting  . Oxybutynin Chloride Nausea And Vomiting    Antimicrobials this admission: Vanc x 1 in ED Cefepime 9/4 >>   Dose adjustments this admission:  Microbiology results: BCx: UCx:  Sputum:  MRSA PCR:  Thank you for allowing pharmacy to be a part of this patient's care.  Romeo Rabon, PharmD, pager [phone]. 03/31/2016,1:20 PM.  Addendum: Pharmacy is asked to dose Vancomycin. Updated weight 47kg. Start Vanc 500mg  IV q12h.  Measure Vanc trough at steady state. Goal trough 15-21mcg/ml.  Romeo Rabon, PharmD, pager [phone]. 03/31/2016,6:04 PM.
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
## 2016-03-31 NOTE — ED Notes (Signed)
Bed: [id number] Expected date: 03/31/16 Expected time:  Means of arrival:  Comments: EMS- elderly, increasing pressure ulcer/fever

## 2016-03-31 NOTE — Progress Notes (Signed)
RN received report, Pt arrived unit, alert and oriented. MD notified of Pt's location. Will continue with current plan of care.

## 2016-03-31 NOTE — ED Triage Notes (Signed)
EMS report pt has sacral wound that is increasing in size over the last few days, due to neck surgery limited movement of ext.
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
## 2016-03-31 NOTE — Progress Notes (Signed)
ED CM contacted [facility] staff, Edwina to informed her of pt admission to [facility]

## 2016-04-01 LAB — COMPREHENSIVE METABOLIC PANEL
ALBUMIN: 1.8 g/dL — AB (ref 3.5–5.0)
ALT: 10 U/L — ABNORMAL LOW (ref 14–54)
AST: 11 U/L — AB (ref 15–41)
Alkaline Phosphatase: 91 U/L (ref 38–126)
Anion gap: 4 — ABNORMAL LOW (ref 5–15)
BILIRUBIN TOTAL: 0.6 mg/dL (ref 0.3–1.2)
BUN: 12 mg/dL (ref 6–20)
CALCIUM: 7.2 mg/dL — AB (ref 8.9–10.3)
CO2: 24 mmol/L (ref 22–32)
Chloride: 105 mmol/L (ref 101–111)
GLUCOSE: 162 mg/dL — AB (ref 65–99)
Potassium: 3.1 mmol/L — ABNORMAL LOW (ref 3.5–5.1)
Sodium: 133 mmol/L — ABNORMAL LOW (ref 135–145)
TOTAL PROTEIN: 4.2 g/dL — AB (ref 6.5–8.1)

## 2016-04-01 LAB — CBC
HCT: 30.2 % — ABNORMAL LOW (ref 36.0–46.0)
Hemoglobin: 10 g/dL — ABNORMAL LOW (ref 12.0–15.0)
MCH: 30.2 pg (ref 26.0–34.0)
MCHC: 33.1 g/dL (ref 30.0–36.0)
MCV: 91.2 fL (ref 78.0–100.0)
Platelets: 293 10*3/uL (ref 150–400)
RBC: 3.31 MIL/uL — ABNORMAL LOW (ref 3.87–5.11)
RDW: 14.3 % (ref 11.5–15.5)
WBC: 7.2 10*3/uL (ref 4.0–10.5)

## 2016-04-01 MED ORDER — POTASSIUM CHLORIDE CRYS ER 20 MEQ PO TBCR
40.0000 meq | EXTENDED_RELEASE_TABLET | Freq: Once | ORAL | Status: AC
  Administered 2016-04-01: 40 meq via ORAL

## 2016-04-01 MED ORDER — BOOST / RESOURCE BREEZE PO LIQD
1.0000 | Freq: Four times a day (QID) | ORAL | Status: DC
  Administered 2016-04-01 – 2016-04-05 (×11): 1 via ORAL

## 2016-04-01 MED ORDER — IBUPROFEN 200 MG PO TABS
600.0000 mg | ORAL_TABLET | Freq: Four times a day (QID) | ORAL | Status: DC | PRN
  Administered 2016-04-01 – 2016-04-04 (×2): 600 mg via ORAL
  Filled 2016-04-01 (×3): qty 3

## 2016-04-01 MED ORDER — VANCOMYCIN HCL IN DEXTROSE 750-5 MG/150ML-% IV SOLN
750.0000 mg | INTRAVENOUS | Status: DC
  Administered 2016-04-01: 750 mg via INTRAVENOUS
  Filled 2016-04-01 (×2): qty 150

## 2016-04-01 NOTE — Procedures (Signed)
The dead subcutaneous tissue within the sacral decubitous wound was debrided sharply with scissors back to healthier appearing tissue. She tolerated this well. The wound was then repacked with gauze and a clean dressing.

## 2016-04-01 NOTE — Progress Notes (Signed)
Subjective: No complaints  Objective: Vital signs in last 24 hours: Temp:  [98.2 F (36.8 C)-98.9 F (37.2 C)] 98.2 F (36.8 C) (09/05 0544) Pulse Rate:  [63-111] 101 (09/05 0824) Resp:  [13-18] 16 (09/05 0544) BP: (113-129)/(70-89) 120/78 (09/05 0824) SpO2:  [98 %-99 %] 99 % (09/05 0824) Weight:  [46.8 kg (103 lb 2.8 oz)-48.6 kg (107 lb 2.3 oz)] 48.6 kg (107 lb 2.3 oz) (09/05 0824) Last BM Date: 03/31/16 (03/31/16)  Intake/Output from previous day: 09/04 0701 - 09/05 0700 In: -  Out: 1 [Stool:1] Intake/Output this shift: Total I/O In: 350 [P.O.:350] Out: 2 [Urine:1; Stool:1]  Resp: clear to auscultation bilaterally Cardio: regular rate and rhythm GI: soft, non-tender; bowel sounds normal; no masses,  no organomegaly Incision/Wound: sacral wound open with a small amount of necrotic tissue inferiorly. This was debrided sharply  Lab Results:   Recent Labs  03/31/16 1330 03/31/16 1403 04/01/16 0512  WBC 7.0  --  7.2  HGB 10.8* 11.2* 10.0*  HCT 32.5* 33.0* 30.2*  PLT 285  --  293   BMET  Recent Labs  03/31/16 1330 03/31/16 1403 04/01/16 0512  NA 134* 135 133*  K 3.2* 3.2* 3.1*  CL 104 99* 105  CO2 24  --  24  GLUCOSE 101* 99 162*  BUN 11 9 12   CREATININE <0.30* 0.30* <0.30*  CALCIUM 7.6*  --  7.2*   PT/INR No results for input(s): LABPROT, INR in the last 72 hours. ABG No results for input(s): PHART, HCO3 in the last 72 hours.  Invalid input(s): PCO2, PO2  Studies/Results: Dg Sacrum/coccyx  Result Date: 03/31/2016 CLINICAL DATA:  Decubitus ulcer. EXAM: SACRUM AND COCCYX - 2+ VIEW COMPARISON:  None. FINDINGS: The pubic symphysis and SI joints appear normal. No obvious destructive bony changes. The lower sacral segments and coccyx are difficult to identified. This may be technique related. Osteomyelitis could not be excluded. Both hips are normally located. Suspect diffuse ileus. IMPRESSION: Difficult to identify the lower sacral segments and coccyx.  This could be technique related but could not exclude osteomyelitis. MRI would be the best test for further evaluation. Electronically Signed   By: Marijo Sanes M.D.   On: 03/31/2016 13:43   Mr Sacrum/si Joints Wo Contrast  Result Date: 04/01/2016 CLINICAL DATA:  Sacral decubitus ulcer. EXAM: MR SACRUM WITHOUT CONTRAST TECHNIQUE: Multiplanar, multisequence MR imaging was performed. No intravenous contrast was administered. COMPARISON:  Radiographs 03/31/2016 FINDINGS: Exam is somewhat limited. Patient refused to continue with the examination. There is subcutaneous soft tissue swelling/ edema and small defects in the skin along with a small amount of air in the subcutaneous tissues consistent with a pressure lesion/decubitus. I do not see any definite signal abnormality in the sacrum and coccyx to suggest osteomyelitis. The SI joints are intact. No findings for septic arthritis. Both hips are normally located. No stress fracture or AVN. No significant intrapelvic abnormalities are demonstrated. There is mild presacral edema. IMPRESSION: Limited examination as above. Sacral decubitus ulcer with cellulitis, small defects in the skin and a small amount of air in the soft tissues. No definite MR findings for osteomyelitis. Electronically Signed   By: Marijo Sanes M.D.   On: 04/01/2016 08:22    Anti-infectives: Anti-infectives    Start     Dose/Rate Route Frequency Ordered Stop   04/01/16 0600  vancomycin (VANCOCIN) 500 mg in sodium chloride 0.9 % 100 mL IVPB     500 mg 100 mL/hr over 60 Minutes Intravenous Every 12  hours 03/31/16 1808     03/31/16 1400  ceFEPIme (MAXIPIME) 1 g in dextrose 5 % 50 mL IVPB     1 g 100 mL/hr over 30 Minutes Intravenous Every 8 hours 03/31/16 1321     03/31/16 1300  vancomycin (VANCOCIN) IVPB 1000 mg/200 mL premix     1,000 mg 200 mL/hr over 60 Minutes Intravenous  Once 03/31/16 1255 03/31/16 1528      Assessment/Plan: s/p * No surgery found * Continue dressing  changes and abx  Sacral wound debrided at bedside and she tolerated this well Would recommend PT for hydrotherapy Will follow  LOS: 1 day    TOTH III,PAUL S 04/01/2016
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
## 2016-04-01 NOTE — Progress Notes (Signed)
Pharmacy Antibiotic Note  [name] is a 70 y.o. bedbound female with recent cervical spine surgery in May 2017 who was admitted on 03/31/2016 with worsening sacral decubitus ulcer. Pharmacy consulted to assist with dosing of Vancomycin and Cefepime. MRI shows sacral decubitus ulcer with cellulitis, small defects in the skin and a small amount of air in the soft tissues, no definite findings for osteomyelitis. Wound was debrided at bedside today by surgery.  Plan: Continue Cefepime 1g IV q8h. Adjust Vancomycin to 750mg  IV q24h. Follow up renal function, culture results, clinical course, and for de-escalation of therapy.  Height: 5\' 9"  (175.3 cm) Weight: 107 lb 2.3 oz (48.6 kg) IBW/kg (Calculated) : 66.2  Temp (24hrs), Avg:98.5 F (36.9 C), Min:98.2 F (36.8 C), Max:98.9 F (37.2 C)   Recent Labs Lab 03/31/16 1330 03/31/16 1403 03/31/16 1404 04/01/16 0512  WBC 7.0  --   --  7.2  CREATININE <0.30* 0.30*  --  <0.30*  LATICACIDVEN  --   --  0.96  --     CrCl cannot be calculated (This lab value cannot be used to calculate CrCl because it is not a number: <0.30).    Allergies  Allergen Reactions  . Aleve [Naproxen Sodium] Other (See Comments)    Doesn't work..  . Codeine Nausea And Vomiting and Other (See Comments)    "head explode"   . Prednisone Swelling    Swelling of throat  . Tylenol [Acetaminophen]     Headaches per pt  . Ciprofloxacin Nausea And Vomiting  . Escitalopram Oxalate Nausea And Vomiting  . Gabapentin Nausea And Vomiting  . Hydrocodone Nausea And Vomiting  . Oxybutynin Chloride Nausea And Vomiting    Antimicrobials this admission: Vancomycin 9/4 >> Cefepime 9/4 >>   Microbiology results: 9/4 BCx: in process 9/4 sacral wound: GS-abundant GPC in pairs, gram variable rod. Cx: moderate E.coli, final pending   Thank you for allowing pharmacy to be a part of this patient's care.   Lindell Spar, PharmD, BCPS Pager: [phone] 04/01/2016 3:19  PM
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
## 2016-04-01 NOTE — Progress Notes (Signed)
Initial Nutrition Assessment  DOCUMENTATION CODES:   Severe malnutrition in context of chronic illness, Underweight  INTERVENTION:   Provide Boost Breeze po QID, each supplement provides 250 kcal and 9 grams of protein RD to continue to monitor  NUTRITION DIAGNOSIS:   Malnutrition related to chronic illness as evidenced by percent weight loss, severe depletion of body fat, severe depletion of muscle mass.  GOAL:   Patient will meet greater than or equal to 90% of their needs  MONITOR:   PO intake, Supplement acceptance, Labs, Weight trends, Skin, I & O's  REASON FOR ASSESSMENT:   Malnutrition Screening Tool    ASSESSMENT:   70 y.o. female with medical history significant for nonambulatory state, she is bedbound due to a cervical injury. She was discharged from the skilled nursing facility on June 24, at that point in time she did have a small sacral wound, pressure related. Apparently at home her one has been worsening, she has been receiving local wound care with home health once weekly. She appointment has been made with the wound care clinic for September 18. Her sister had turns patient frequently, unfortunately have not been able to get an air mattress.  Patient in room with sister at bedside. [name] states pt has had trouble swallowing for several months now since spine surgery. [name] suspects steroids have caused some swelling. SLP has evaluated 9/5: moderate aspiration risk, recommend full liquid diet until MBS can be completed.  Patient likes Boost Breeze supplements, will order QID. Pt does not like Ensure/Boost supplements since milk products cause her constipation.  Per weight history, pt has lost 29 lb since 6/27 (21% wt loss x 2 months, significant for time frame).  Nutrition-Focused physical exam completed. Findings are severe fat depletion, severe muscle depletion, and no edema.   Medications: MAG-OX tablet daily, Lovaza (Omega-3) capsule daily,  Vitamin B-12 tablet daily, D5-.9% NaCl infusion at 75 ml/hr -provides 306 kcal Labs reviewed: Low Na, K  Diet Order:  Diet full liquid Room service appropriate? Yes; Fluid consistency: Thin  Skin:  Wound (see comment) (Stage 4 sacral ulcer)  Last BM:  9/4  Height:   Ht Readings from Last 1 Encounters:  04/01/16 5\' 9"  (1.753 m)    Weight:   Wt Readings from Last 1 Encounters:  04/01/16 107 lb 2.3 oz (48.6 kg)    Ideal Body Weight:  56 kg  BMI:  Body mass index is 15.82 kg/m.  Estimated Nutritional Needs:   Kcal:  1400-1600  Protein:  65-75g  Fluid:  1.5L/day  EDUCATION NEEDS:   No education needs identified at this time  Clayton Bibles, MS, RD, LDN Pager: [phone] After Hours Pager: [phone]

## 2016-04-01 NOTE — Consult Note (Addendum)
WOC consult requested prior to surgical team involvement.  They are currently debriding the stage 4 sacral wound at the bedside; refer to their team for further plan of care. Please re-consult if further assistance is needed.  Thank-you,  Julien Girt MSN, [place], [place], [place], [place]

## 2016-04-01 NOTE — Progress Notes (Signed)
PROGRESS NOTE    [name]  [id number] DOB: [birth year] DOA: 03/31/2016 PCP: Laurey Morale, MD    Brief Narrative:     Assessment & Plan:   Active Problems:   Sacral decubitus ulcer - continue antibiotic regimen - general surgery consulted.  - once ready to transition to oral regimen discuss with care manager to assist in obtaining oral medication regimen.     Hypokalemia - will replace orally and reassess    GERD - place on pepcid    Hyperlipidemia - continue lovaza    DVT prophylaxis: Lovenox Code Status: full Family Communication: d/c patient directly Disposition Plan: d/c in the next 1-2 days   Consultants:   General surgery   Procedures: none   Antimicrobials: Cefepime, vancomycin   Subjective: Pt has no new complaints  Objective: Vitals:   03/31/16 2214 04/01/16 0544 04/01/16 0824 04/01/16 1400  BP: 128/89 113/70 120/78 109/65  Pulse: 95 63 (!) 101 98  Resp: 18 16  18   Temp: 98.9 F (37.2 C) 98.2 F (36.8 C)  98.7 F (37.1 C)  TempSrc: Oral Oral  Oral  SpO2: 98% 98% 99% 99%  Weight:   48.6 kg (107 lb 2.3 oz)   Height:   5\' 9"  (1.753 m)     Intake/Output Summary (Last 24 hours) at 04/01/16 1536 Last data filed at 04/01/16 1300  Gross per 24 hour  Intake              350 ml  Output                3 ml  Net              347 ml   Filed Weights   03/31/16 1639 04/01/16 0824  Weight: 46.8 kg (103 lb 2.8 oz) 48.6 kg (107 lb 2.3 oz)    Examination:  General exam: Appears calm and comfortable , in nad. Respiratory system: Clear to auscultation. Respiratory effort normal. Cardiovascular system: S1 & S2 heard, RRR. No JVD, murmurs, rubs, gallops or clicks. Gastrointestinal system: Abdomen is nondistended, soft and nontender. No organomegaly or masses felt. Normal bowel sounds heard. Central nervous system: Alert and oriented. No facial asymmetry  Extremities: no clubbing Skin: No rashes, lesions or ulcers, on limited  exam. Psychiatry: Mood & affect appropriate.     Data Reviewed: I have personally reviewed following labs and imaging studies  CBC:  Recent Labs Lab 03/31/16 1330 03/31/16 1403 04/01/16 0512  WBC 7.0  --  7.2  NEUTROABS 5.6  --   --   HGB 10.8* 11.2* 10.0*  HCT 32.5* 33.0* 30.2*  MCV 89.8  --  91.2  PLT 285  --  0000000   Basic Metabolic Panel:  Recent Labs Lab 03/31/16 1330 03/31/16 1403 04/01/16 0512  NA 134* 135 133*  K 3.2* 3.2* 3.1*  CL 104 99* 105  CO2 24  --  24  GLUCOSE 101* 99 162*  BUN 11 9 12   CREATININE <0.30* 0.30* <0.30*  CALCIUM 7.6*  --  7.2*   GFR: CrCl cannot be calculated (This lab value cannot be used to calculate CrCl because it is not a number: <0.30). Liver Function Tests:  Recent Labs Lab 04/01/16 0512  AST 11*  ALT 10*  ALKPHOS 91  BILITOT 0.6  PROT 4.2*  ALBUMIN 1.8*   No results for input(s): LIPASE, AMYLASE in the last 168 hours. No results for input(s): AMMONIA in the last 168 hours. Coagulation Profile: No  results for input(s): INR, PROTIME in the last 168 hours. Cardiac Enzymes: No results for input(s): CKTOTAL, CKMB, CKMBINDEX, TROPONINI in the last 168 hours. BNP (last 3 results) No results for input(s): PROBNP in the last 8760 hours. HbA1C: No results for input(s): HGBA1C in the last 72 hours. CBG: No results for input(s): GLUCAP in the last 168 hours. Lipid Profile: No results for input(s): CHOL, HDL, LDLCALC, TRIG, CHOLHDL, LDLDIRECT in the last 72 hours. Thyroid Function Tests: No results for input(s): TSH, T4TOTAL, FREET4, T3FREE, THYROIDAB in the last 72 hours. Anemia Panel: No results for input(s): VITAMINB12, FOLATE, FERRITIN, TIBC, IRON, RETICCTPCT in the last 72 hours. Sepsis Labs:  Recent Labs Lab 03/31/16 1404  LATICACIDVEN 0.96    Recent Results (from the past 240 hour(s))  Blood culture (routine x 2)     Status: None (Preliminary result)   Collection Time: 03/31/16  1:30 PM  Result Value Ref  Range Status   Specimen Description BLOOD RIGHT FOREARM  Final   Special Requests NONE  Final   Culture   Final    NO GROWTH < 24 HOURS Performed at [facility]    Report Status PENDING  Incomplete  Blood culture (routine x 2)     Status: None (Preliminary result)   Collection Time: 03/31/16  1:50 PM  Result Value Ref Range Status   Specimen Description BLOOD RIGHT HAND  Final   Special Requests BOTTLES DRAWN AEROBIC AND ANAEROBIC 5CC  Final   Culture   Final    NO GROWTH < 24 HOURS Performed at [facility]    Report Status PENDING  Incomplete  Wound or Superficial Culture     Status: None (Preliminary result)   Collection Time: 03/31/16  2:05 PM  Result Value Ref Range Status   Specimen Description SACRAL  Final   Special Requests Normal  Final   Gram Stain   Final    MODERATE WBC PRESENT,BOTH PMN AND MONONUCLEAR ABUNDANT GRAM POSITIVE COCCI IN PAIRS ABUNDANT GRAM VARIABLE ROD Performed at [facility]  Final   Report Status PENDING  Incomplete         Radiology Studies: Dg Sacrum/coccyx  Result Date: 03/31/2016 CLINICAL DATA:  Decubitus ulcer. EXAM: SACRUM AND COCCYX - 2+ VIEW COMPARISON:  None. FINDINGS: The pubic symphysis and SI joints appear normal. No obvious destructive bony changes. The lower sacral segments and coccyx are difficult to identified. This may be technique related. Osteomyelitis could not be excluded. Both hips are normally located. Suspect diffuse ileus. IMPRESSION: Difficult to identify the lower sacral segments and coccyx. This could be technique related but could not exclude osteomyelitis. MRI would be the best test for further evaluation. Electronically Signed   By: Marijo Sanes M.D.   On: 03/31/2016 13:43   Mr Sacrum/si Joints Wo Contrast  Result Date: 04/01/2016 CLINICAL DATA:  Sacral decubitus ulcer. EXAM: MR SACRUM WITHOUT CONTRAST TECHNIQUE: Multiplanar, multisequence MR imaging was  performed. No intravenous contrast was administered. COMPARISON:  Radiographs 03/31/2016 FINDINGS: Exam is somewhat limited. Patient refused to continue with the examination. There is subcutaneous soft tissue swelling/ edema and small defects in the skin along with a small amount of air in the subcutaneous tissues consistent with a pressure lesion/decubitus. I do not see any definite signal abnormality in the sacrum and coccyx to suggest osteomyelitis. The SI joints are intact. No findings for septic arthritis. Both hips are normally located. No stress fracture or  AVN. No significant intrapelvic abnormalities are demonstrated. There is mild presacral edema. IMPRESSION: Limited examination as above. Sacral decubitus ulcer with cellulitis, small defects in the skin and a small amount of air in the soft tissues. No definite MR findings for osteomyelitis. Electronically Signed   By: Marijo Sanes M.D.   On: 04/01/2016 08:22        Scheduled Meds: . ALPRAZolam  0.25 mg Oral BID  . aspirin EC  81 mg Oral Daily  . ceFEPime (MAXIPIME) IV  1 g Intravenous Q8H  . enoxaparin (LOVENOX) injection  40 mg Subcutaneous Q24H  . famotidine  20 mg Oral Daily  . feeding supplement  1 Container Oral QID  . magnesium oxide  400 mg Oral Daily  . omega-3 acid ethyl esters  1 g Oral Daily  . vancomycin  750 mg Intravenous Q24H  . vitamin B-12  1,000 mcg Oral Daily   Continuous Infusions: . dextrose 5 % and 0.9% NaCl 75 mL/hr at 04/01/16 0857     LOS: 1 day    Time spent: > 35 minutes    Velvet Bathe, MD [facility] Pager [phone]  If 7PM-7AM, please contact night-coverage [email or web address] Password [email or web address] 04/01/2016, 3:36 PM

## 2016-04-01 NOTE — Progress Notes (Signed)
  BSE completed, full report to follow.  Of note, pt reports h/o chronic dysphagia since cervical spine surgery in May 2017 with a period of mild improvement.  She states now swallow ability is as bad as post=surgery May again and she questions swelling from steroids.  Pt reports normal weight is 149 and she is now 107 = suspect dysphagia contributing to weight loss.  Pt reports liquids are easier to clear throat than solids, she states solids make her gag.  Will modify to full liquids pending MBS next date.  Educated pt and brother to recommendations and reinforced compenstion.   Per review of notes, HH SLP desired MBS.    Luanna Salk, [facility] SLP  [phone]

## 2016-04-02 ENCOUNTER — Inpatient Hospital Stay (HOSPITAL_COMMUNITY): Payer: Medicare Other

## 2016-04-02 DIAGNOSIS — G959 Disease of spinal cord, unspecified: Secondary | ICD-10-CM

## 2016-04-02 DIAGNOSIS — L89154 Pressure ulcer of sacral region, stage 4: Principal | ICD-10-CM

## 2016-04-02 MED ORDER — CEFAZOLIN IN D5W 1 GM/50ML IV SOLN
1.0000 g | Freq: Three times a day (TID) | INTRAVENOUS | Status: DC
  Administered 2016-04-02 – 2016-04-04 (×5): 1 g via INTRAVENOUS
  Filled 2016-04-02 (×6): qty 50
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
## 2016-04-02 NOTE — Progress Notes (Signed)
Speech Language Pathology Treatment: Dysphagia  Patient Details Name: [name] MRN: [id number] DOB: August 11, 1945 Today's Date: 04/02/2016 Time: [id number] SLP Time Calculation (min) (ACUTE ONLY): 17 min  Assessment / Plan / Recommendation Clinical Impression  SLP was notified by PT that pt's sister wanted education regarding pt's swallowing test.  SLP subsequently educated pt and showed sister live video feed.  Imparted concern for pt's ability to meet nutritional needs due to her dysphagia.  Pt again states she does not want feeding tube - SLP questions if pt would benefit from palliative referral to establish goals given ongoing dysphagia and progressive weight loss.    Of note, pt and her sister suspect difficulties started when pt had been given steroids from Dr Sharlene Motts after discharge from [place] to home? However they also report pt was on steroids BEFORE surgery and did not have difficulties at that time.  They also do not know for certain if pt was on steroid at SNF.     Advised no aspiration observed on MBS but risk present due to pharyngeal/vallecular residuals and inability to fully clear.  Reviewed purpose of compensation strategies and mitigation strategies.  Pt able to verbalize reasoning for strategies.  Will continue to follow.     HPI HPI: 70 yo female adm to [facility] with sacral decub.  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  Pt resides at home and requires total care.  She also has h/o dysphagia and underwent an MBS June 2017 with recommendations for dys1/thin with strict precautions. last cxr completed was 12/2015 and was negative.        SLP Plan  Continue with current plan of care     Recommendations  Diet recommendations: Thin liquid;Other(comment) (clears) Medication Administration: Crushed with puree (defer to pt) Compensations: Slow rate;Small sips/bites;Multiple dry swallows after each bite/sip (allow time for dry  swallows, clear throat/cough if needed) Postural Changes and/or Swallow Maneuvers: Seated upright 90 degrees;Upright 30-60 min after meal             Oral Care Recommendations: Oral care BID Follow up Recommendations: Home health SLP Plan: Continue with current plan of care     [place], [place] [facility] SLP [phone]

## 2016-04-02 NOTE — Progress Notes (Signed)
Pharmacy Antibiotic Note  [name] is a 70 y.o. bedbound female with recent cervical spine surgery in May 2017 who was admitted on 03/31/2016 with worsening sacral decubitus ulcer. Pharmacy consulted to assist with dosing of Vancomycin and Cefepime. MRI shows sacral decubitus ulcer with cellulitis, small defects in the skin and a small amount of air in the soft tissues, no definite findings for osteomyelitis. Wound was debrided at bedside by surgery.  Sacral decubitus culture grew E.Coli.  Blood cultures show no growth to date.  Plan: Recommend de-escalating antibiotic regimen to cefazolin 1 gram IV q8h. Text-paged attending MD - his evaluation is pending. If patient is not a candidate for de-escalation yet, then continue cefepime 1 gram IV q8h + vancomycin 750mg  IV q24h and check vancomycin trough tonight at 2100.    Height: 5\' 9"  (175.3 cm) Weight: 107 lb 2.3 oz (48.6 kg) IBW/kg (Calculated) : 66.2  Temp (24hrs), Avg:97.9 F (36.6 C), Min:97.5 F (36.4 C), Max:98.7 F (37.1 C)   Recent Labs Lab 03/31/16 1330 03/31/16 1403 03/31/16 1404 04/01/16 0512  WBC 7.0  --   --  7.2  CREATININE <0.30* 0.30*  --  <0.30*  LATICACIDVEN  --   --  0.96  --     CrCl cannot be calculated (This lab value cannot be used to calculate CrCl because it is not a number: <0.30).    Allergies  Allergen Reactions  . Aleve [Naproxen Sodium] Other (See Comments)    Doesn't work..  . Codeine Nausea And Vomiting and Other (See Comments)    "head explode"   . Prednisone Swelling    Swelling of throat  . Tylenol [Acetaminophen]     Headaches per pt  . Ciprofloxacin Nausea And Vomiting  . Escitalopram Oxalate Nausea And Vomiting  . Gabapentin Nausea And Vomiting  . Hydrocodone Nausea And Vomiting  . Oxybutynin Chloride Nausea And Vomiting    Antimicrobials this admission: Vancomycin 9/4 >> Cefepime 9/4 >>   Microbiology results: 9/4 BCx: in process 9/4 sacral wound: GS-abundant GPC  in pairs, gram variable rod. Cx: moderate E.coli, susceptible to all drugs tested EXCEPT resistant to Bactrim.   Thank you for allowing pharmacy to be a part of this patient's care.  Clayburn Pert, PharmD, BCPS Pager: [phone] 04/02/2016  11:12 AM

## 2016-04-02 NOTE — Progress Notes (Addendum)
PROGRESS NOTE                                                                                                                                                                                                             Patient Demographics:    [name], is a 70 y.o. female, DOB - [birth year], UR:[id number]  Admit date - 03/31/2016   Admitting Physician Mauricio Gerome Apley, MD  Outpatient Primary MD for the patient is Laurey Morale, MD  LOS - 2  Outpatient Specialists:none  Chief Complaint  Patient presents with  . 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 Patient has been living with her sister. She was discharged from skilled nursing facility on 01/19/2016 and at that time had a small sacral wound. This has progressively worsened despite receiving wound care with home health and frequent repositioning. Patient sent to the ED by wound care given worsening of the wound with purulent discharge and malodor. Patient admitted for IV fluids, antibiotics and surgical evaluation.   Subjective:   Patient complains of generalized pain.   Assessment  & Plan :   Principal problem Infected sacral decubitus wound  Wound care consult appreciated. Wound debrided at bedside by surgery on 9/5. PT consulted for hydrotherapy.  Blood cx negative. Wound culture growing ecoli. MRI sacrum negative for abscess or osteomyelitis, suggests cellulitis.  Narrow abx to IV cefazolin.will place foley temporarily.     Active Problems:  dysphagia  per sister this has been progressive since her cervical injury and tolerating liquid only. SLP evaluated abd recommends thin liquids.    Hypokalemia replenished  Cervical myelopathy Has functional quadriplegia with severe weakness. Follows with  neurology Dr Posey Pronto. Obtain PT eval.  Severe protein calorie malnutrition Added nutrition suplement  Anxiety continue xanax.    Code Status : full code  Family Communication  : sister at bedside  Disposition Plan  : home with [facility] possibly in 1-2 days  Barriers For Discharge : none  Consults  :  CCS  Procedures  : MRI pelvis  DVT Prophylaxis  :  Lovenox -   Lab Results  Component Value Date   PLT 293 04/01/2016    Antibiotics  :    Anti-infectives  Start     Dose/Rate Route Frequency Ordered Stop   04/01/16 2200  vancomycin (VANCOCIN) IVPB 750 mg/150 ml premix     750 mg 150 mL/hr over 60 Minutes Intravenous Every 24 hours 04/01/16 1446     04/01/16 0600  vancomycin (VANCOCIN) 500 mg in sodium chloride 0.9 % 100 mL IVPB  Status:  Discontinued     500 mg 100 mL/hr over 60 Minutes Intravenous Every 12 hours 03/31/16 1808 04/01/16 1446   03/31/16 1400  ceFEPIme (MAXIPIME) 1 g in dextrose 5 % 50 mL IVPB     1 g 100 mL/hr over 30 Minutes Intravenous Every 8 hours 03/31/16 1321     03/31/16 1300  vancomycin (VANCOCIN) IVPB 1000 mg/200 mL premix     1,000 mg 200 mL/hr over 60 Minutes Intravenous  Once 03/31/16 1255 03/31/16 1528        Objective:   Vitals:   04/01/16 1400 04/01/16 2025 04/02/16 0535 04/02/16 1343  BP: 109/65 107/63 115/63 116/62  Pulse: 98 99 95 93  Resp: 18 20 18 18   Temp: 98.7 F (37.1 C) 97.5 F (36.4 C) 97.6 F (36.4 C) 97.5 F (36.4 C)  TempSrc: Oral Oral Oral Oral  SpO2: 99% 98% 98% 99%  Weight:      Height:        Wt Readings from Last 3 Encounters:  04/01/16 48.6 kg (107 lb 2.3 oz)  02/18/16 51.7 kg (114 lb)  02/13/16 51.8 kg (114 lb 3.2 oz)     Intake/Output Summary (Last 24 hours) at 04/02/16 1357 Last data filed at 04/02/16 0542  Gross per 24 hour  Intake             1695 ml  Output                3 ml  Net             1692 ml     Physical Exam  Gen: not in distress, fatigued HEENT: no pallor, moist mucosa,  supple neck Chest: clear b/l, no added sounds CVS: N S1&S2, no murmurs,  GI: soft, NT, ND, BS+ Musculoskeletal: warm, no edema, stage IV sacral ulcer with some mucopurulent discharge. Non odorous.     Data Review:    CBC  Recent Labs Lab 03/31/16 1330 03/31/16 1403 04/01/16 0512  WBC 7.0  --  7.2  HGB 10.8* 11.2* 10.0*  HCT 32.5* 33.0* 30.2*  PLT 285  --  293  MCV 89.8  --  91.2  MCH 29.8  --  30.2  MCHC 33.2  --  33.1  RDW 14.0  --  14.3  LYMPHSABS 0.6*  --   --   MONOABS 0.7  --   --   EOSABS 0.0  --   --   BASOSABS 0.0  --   --     Chemistries   Recent Labs Lab 03/31/16 1330 03/31/16 1403 04/01/16 0512  NA 134* 135 133*  K 3.2* 3.2* 3.1*  CL 104 99* 105  CO2 24  --  24  GLUCOSE 101* 99 162*  BUN 11 9 12   CREATININE <0.30* 0.30* <0.30*  CALCIUM 7.6*  --  7.2*  AST  --   --  11*  ALT  --   --  10*  ALKPHOS  --   --  91  BILITOT  --   --  0.6   ------------------------------------------------------------------------------------------------------------------ No results for input(s): CHOL, HDL, LDLCALC, TRIG, CHOLHDL, LDLDIRECT in  the last 72 hours.  Lab Results  Component Value Date   HGBA1C 6.2 10/30/2015   ------------------------------------------------------------------------------------------------------------------ No results for input(s): TSH, T4TOTAL, T3FREE, THYROIDAB in the last 72 hours.  Invalid input(s): FREET3 ------------------------------------------------------------------------------------------------------------------ No results for input(s): VITAMINB12, FOLATE, FERRITIN, TIBC, IRON, RETICCTPCT in the last 72 hours.  Coagulation profile No results for input(s): INR, PROTIME in the last 168 hours.  No results for input(s): DDIMER in the last 72 hours.  Cardiac Enzymes No results for input(s): CKMB, TROPONINI, MYOGLOBIN in the last 168 hours.  Invalid input(s):  CK ------------------------------------------------------------------------------------------------------------------ No results found for: BNP  Inpatient Medications  Scheduled Meds: . ALPRAZolam  0.25 mg Oral BID  . aspirin EC  81 mg Oral Daily  . ceFEPime (MAXIPIME) IV  1 g Intravenous Q8H  . enoxaparin (LOVENOX) injection  40 mg Subcutaneous Q24H  . famotidine  20 mg Oral Daily  . feeding supplement  1 Container Oral QID  . magnesium oxide  400 mg Oral Daily  . omega-3 acid ethyl esters  1 g Oral Daily  . vancomycin  750 mg Intravenous Q24H  . vitamin B-12  1,000 mcg Oral Daily   Continuous Infusions: . dextrose 5 % and 0.9% NaCl 75 mL/hr at 04/01/16 0857   PRN Meds:.fentaNYL (SUBLIMAZE) injection, ibuprofen, ondansetron **OR** ondansetron (ZOFRAN) IV  Micro Results Recent Results (from the past 240 hour(s))  Blood culture (routine x 2)     Status: None (Preliminary result)   Collection Time: 03/31/16  1:30 PM  Result Value Ref Range Status   Specimen Description BLOOD RIGHT FOREARM  Final   Special Requests NONE  Final   Culture   Final    NO GROWTH < 24 HOURS Performed at [facility]    Report Status PENDING  Incomplete  Blood culture (routine x 2)     Status: None (Preliminary result)   Collection Time: 03/31/16  1:50 PM  Result Value Ref Range Status   Specimen Description BLOOD RIGHT HAND  Final   Special Requests BOTTLES DRAWN AEROBIC AND ANAEROBIC 5CC  Final   Culture   Final    NO GROWTH < 24 HOURS Performed at [facility]    Report Status PENDING  Incomplete  Wound or Superficial Culture     Status: None (Preliminary result)   Collection Time: 03/31/16  2:05 PM  Result Value Ref Range Status   Specimen Description SACRAL  Final   Special Requests Normal  Final   Gram Stain   Final    MODERATE WBC PRESENT,BOTH PMN AND MONONUCLEAR ABUNDANT GRAM POSITIVE COCCI IN PAIRS ABUNDANT GRAM VARIABLE ROD    Culture MODERATE ESCHERICHIA COLI   Final   Report Status PENDING  Incomplete   Organism ID, Bacteria ESCHERICHIA COLI  Final      Susceptibility   Escherichia coli - MIC*    AMPICILLIN <=2 SENSITIVE Sensitive     CEFAZOLIN <=4 SENSITIVE Sensitive     CEFEPIME <=1 SENSITIVE Sensitive     CEFTAZIDIME <=1 SENSITIVE Sensitive     CEFTRIAXONE <=1 SENSITIVE Sensitive     CIPROFLOXACIN <=0.25 SENSITIVE Sensitive     GENTAMICIN 2 SENSITIVE Sensitive     IMIPENEM <=0.25 SENSITIVE Sensitive     TRIMETH/SULFA >=320 RESISTANT Resistant     AMPICILLIN/SULBACTAM <=2 SENSITIVE Sensitive     PIP/TAZO <=4 SENSITIVE Sensitive     Extended ESBL Value in next row Sensitive      NEGATIVEPerformed at [facility]    *  MODERATE ESCHERICHIA COLI    Radiology Reports Dg Sacrum/coccyx  Result Date: 03/31/2016 CLINICAL DATA:  Decubitus ulcer. EXAM: SACRUM AND COCCYX - 2+ VIEW COMPARISON:  None. FINDINGS: The pubic symphysis and SI joints appear normal. No obvious destructive bony changes. The lower sacral segments and coccyx are difficult to identified. This may be technique related. Osteomyelitis could not be excluded. Both hips are normally located. Suspect diffuse ileus. IMPRESSION: Difficult to identify the lower sacral segments and coccyx. This could be technique related but could not exclude osteomyelitis. MRI would be the best test for further evaluation. Electronically Signed   By: Marijo Sanes M.D.   On: 03/31/2016 13:43   Mr Sacrum/si Joints Wo Contrast  Result Date: 04/01/2016 CLINICAL DATA:  Sacral decubitus ulcer. EXAM: MR SACRUM WITHOUT CONTRAST TECHNIQUE: Multiplanar, multisequence MR imaging was performed. No intravenous contrast was administered. COMPARISON:  Radiographs 03/31/2016 FINDINGS: Exam is somewhat limited. Patient refused to continue with the examination. There is subcutaneous soft tissue swelling/ edema and small defects in the skin along with a small amount of air in the subcutaneous tissues consistent with a  pressure lesion/decubitus. I do not see any definite signal abnormality in the sacrum and coccyx to suggest osteomyelitis. The SI joints are intact. No findings for septic arthritis. Both hips are normally located. No stress fracture or AVN. No significant intrapelvic abnormalities are demonstrated. There is mild presacral edema. IMPRESSION: Limited examination as above. Sacral decubitus ulcer with cellulitis, small defects in the skin and a small amount of air in the soft tissues. No definite MR findings for osteomyelitis. Electronically Signed   By: Marijo Sanes M.D.   On: 04/01/2016 08:22    Time Spent in minutes  25   Louellen Molder M.D on 04/02/2016 at 1:57 PM  Between 7am to 7pm - Pager - [phone]  After 7pm go to [email or web address] - password [facility]  [facility] -  Office  [phone]

## 2016-04-02 NOTE — Progress Notes (Signed)
Physical Therapy Wound Evaluation Patient Details  Name: [name] MRN: [id number] Date of Birth: March 25, 1946  Today's Date: 04/02/2016 Time: 4174-0814 Time Calculation (min): 45 min  Subjective  Subjective: 70 year old female with hx of cervical spine injury and quadriplegia, bedbound presented with worsening sacral pressure injury  Pain Score: Grimacing during hydrotherapy, declined rest breaks, repositioned offloading sacrum and floating heels  Wound Assessment  Pressure Ulcer 04/02/16 Stage IV - Full thickness tissue loss with exposed bone, tendon or muscle. HYDRO Stage IV sacral pressure injury (Active)  Dressing Type Moist to moist;Foam 04/02/2016  4:00 PM  Dressing Changed 04/02/2016  4:00 PM  Dressing Change Frequency Twice a day 04/02/2016  4:00 PM  State of Healing Early/partial granulation 04/02/2016  4:00 PM  Site / Wound Assessment Red;Yellow 04/02/2016  4:00 PM  % Wound base Red or Granulating 90% 04/02/2016  4:00 PM  % Wound base Yellow 5% 04/02/2016  4:00 PM  % Wound base Other (Comment) 5% 04/02/2016  4:00 PM  Peri-wound Assessment Denuded 04/02/2016  4:00 PM  Wound Length (cm) 4 cm 04/02/2016  4:00 PM  Wound Width (cm) 9.5 cm 04/02/2016  4:00 PM  Wound Depth (cm) 1.5 cm 04/02/2016  4:00 PM  Undermining (cm) 1.5 cm at 12:00, 2.5 cm at 9:00, 1 cm at 6:00 04/02/2016  4:00 PM  Margins Unattached edges (unapproximated) 04/02/2016  4:00 PM  Drainage Amount Moderate 04/02/2016  4:00 PM  Drainage Description Serosanguineous;Serous 04/02/2016  4:00 PM  Treatment Debridement (Selective);Hydrotherapy (Pulse lavage);Packing (Saline gauze) 04/02/2016  4:00 PM                                Hydrotherapy Pulsed lavage therapy - wound location: sacral pressure injury wound bed Pulsed Lavage with Suction (psi): 8 psi Pulsed Lavage with Suction - Normal Saline Used: 1000 mL Pulsed Lavage Tip: Tip with splash shield Selective Debridement Selective Debridement - Location: sacral wound  bed Selective Debridement - Tools Used: Scissors;Forceps Selective Debridement - Tissue Removed: minimal amount of yellow slough removed   Wound Assessment and Plan  Wound Therapy - Assess/Plan/Recommendations Wound Therapy - Clinical Statement: Pt would benefit from hydrotherapy to promote healthy wound environment, remove necrotic tissue and facilitate wound healing Wound Therapy - Functional Problem List: quadriplegia, decreased nutrition - dysphagia, incontinence Factors Delaying/Impairing Wound Healing: Incontinence;Altered sensation;Immobility Hydrotherapy Plan: Debridement;Dressing change;Patient/family education;Pulsatile lavage with suction Wound Therapy - Frequency: 6X / week Wound Therapy - Follow Up Recommendations: Home health RN Wound Plan: Will continue hydrotherapy to improve wound environment and remove necrotic tissue to facilitate wound healing.  Wound Therapy Goals- Improve the function of patient's integumentary system by progressing the wound(s) through the phases of wound healing (inflammation - proliferation - remodeling) by: Decrease Necrotic Tissue to: 0 Decrease Necrotic Tissue - Progress: Goal set today Increase Granulation Tissue to: 100 Increase Granulation Tissue - Progress: Goal set today Patient/Family will be able to : [name] will be able to reposition pt safely and appropriately Patient/Family Instruction Goal - Progress: Goal set today Goals/treatment plan/discharge plan were made with and agreed upon by patient/family: Yes Time For Goal Achievement: 7 days Wound Therapy - Potential for Goals: Good  Goals will be updated until maximal potential achieved or discharge criteria met.  Discharge criteria: when goals achieved, discharge from hospital, MD decision/surgical intervention, no progress towards goals, refusal/missing three consecutive treatments without notification or medical reason.  GP     [employer]  E 04/02/2016, 5:00 PM Carmelia Bake, PT, DPT 04/02/2016 Pager: [phone]

## 2016-04-02 NOTE — Progress Notes (Signed)
Subjective: Sister in the room for dressing change and hydrotherapy.  Wound is about 98% clean and pink.  Some white non viable tissue at the base.  She has a layer of denuded epidermis around the skin edges, and I have ask they cover this with Xeroform.  Sister notes she has trouble with swallowing for some time.  She does not want." a tube," for feeding.    Objective: Vital signs in last 24 hours: Temp:  [97.5 F (36.4 C)-98.7 F (37.1 C)] 97.6 F (36.4 C) (09/06 0535) Pulse Rate:  [95-99] 95 (09/06 0535) Resp:  [18-20] 18 (09/06 0535) BP: (107-115)/(63-65) 115/63 (09/06 0535) SpO2:  [98 %-99 %] 98 % (09/06 0535) Last BM Date: 03/31/16 (03/31/16) 590 PO Voided x 4  BM x 1 Afebrile, VSS  04/01/16 labs: K+ 3.1 WBC is normal Intake/Output from previous day: 09/05 0701 - 09/06 0700 In: 2576.3 [P.O.:590; I.V.:1586.3; IV Piggyback:400] Out: 5 [Urine:4; Stool:1] Intake/Output this shift: No intake/output data recorded.  General appearance: chronically ill and debilitated woman, no acute distress.  she did not speak much during the exam.  She did have a formed stool at exam and this was contained outside the dressing.   Skin: 4 x 9.5 x 1.5 cm  some undermining.  skin around the ulcer about 2-3 cm with epidermis denuded.    Lab Results:   Recent Labs  03/31/16 1330 03/31/16 1403 04/01/16 0512  WBC 7.0  --  7.2  HGB 10.8* 11.2* 10.0*  HCT 32.5* 33.0* 30.2*  PLT 285  --  293    BMET  Recent Labs  03/31/16 1330 03/31/16 1403 04/01/16 0512  NA 134* 135 133*  K 3.2* 3.2* 3.1*  CL 104 99* 105  CO2 24  --  24  GLUCOSE 101* 99 162*  BUN 11 9 12   CREATININE <0.30* 0.30* <0.30*  CALCIUM 7.6*  --  7.2*   PT/INR No results for input(s): LABPROT, INR in the last 72 hours.   Recent Labs Lab 04/01/16 0512  AST 11*  ALT 10*  ALKPHOS 91  BILITOT 0.6  PROT 4.2*  ALBUMIN 1.8*     Lipase     Component Value Date/Time   LIPASE 30 02/11/2016 1417      Studies/Results: Dg Sacrum/coccyx  Result Date: 03/31/2016 CLINICAL DATA:  Decubitus ulcer. EXAM: SACRUM AND COCCYX - 2+ VIEW COMPARISON:  None. FINDINGS: The pubic symphysis and SI joints appear normal. No obvious destructive bony changes. The lower sacral segments and coccyx are difficult to identified. This may be technique related. Osteomyelitis could not be excluded. Both hips are normally located. Suspect diffuse ileus. IMPRESSION: Difficult to identify the lower sacral segments and coccyx. This could be technique related but could not exclude osteomyelitis. MRI would be the best test for further evaluation. Electronically Signed   By: Marijo Sanes M.D.   On: 03/31/2016 13:43   Mr Sacrum/si Joints Wo Contrast  Result Date: 04/01/2016 CLINICAL DATA:  Sacral decubitus ulcer. EXAM: MR SACRUM WITHOUT CONTRAST TECHNIQUE: Multiplanar, multisequence MR imaging was performed. No intravenous contrast was administered. COMPARISON:  Radiographs 03/31/2016 FINDINGS: Exam is somewhat limited. Patient refused to continue with the examination. There is subcutaneous soft tissue swelling/ edema and small defects in the skin along with a small amount of air in the subcutaneous tissues consistent with a pressure lesion/decubitus. I do not see any definite signal abnormality in the sacrum and coccyx to suggest osteomyelitis. The SI joints are intact. No findings for septic  arthritis. Both hips are normally located. No stress fracture or AVN. No significant intrapelvic abnormalities are demonstrated. There is mild presacral edema. IMPRESSION: Limited examination as above. Sacral decubitus ulcer with cellulitis, small defects in the skin and a small amount of air in the soft tissues. No definite MR findings for osteomyelitis. Electronically Signed   By: Marijo Sanes M.D.   On: 04/01/2016 08:22    Medications: . ALPRAZolam  0.25 mg Oral BID  . aspirin EC  81 mg Oral Daily  . ceFEPime (MAXIPIME) IV  1 g Intravenous Q8H   . enoxaparin (LOVENOX) injection  40 mg Subcutaneous Q24H  . famotidine  20 mg Oral Daily  . feeding supplement  1 Container Oral QID  . magnesium oxide  400 mg Oral Daily  . omega-3 acid ethyl esters  1 g Oral Daily  . vancomycin  750 mg Intravenous Q24H  . vitamin B-12  1,000 mcg Oral Daily   . dextrose 5 % and 0.9% NaCl 75 mL/hr at 04/01/16 0857    Assessment/Plan Sacral decubitus Hx of cervical injury - quadraplegia/Bed bound Dysphagia/malnutrition Anxiety FEN:  Full liquids/IV fluids ID:  Day 3 Cefepime/Vancomycin DVT:  Lovenox   Plan:  Local wound care now.  Hydrotherapy, wet to dry dressing changes, air mattress.    LOS: 2 days    [name] 04/02/2016 [phone]

## 2016-04-03 DIAGNOSIS — E43 Unspecified severe protein-calorie malnutrition: Secondary | ICD-10-CM

## 2016-04-03 DIAGNOSIS — L8990 Pressure ulcer of unspecified site, unspecified stage: Secondary | ICD-10-CM

## 2016-04-03 DIAGNOSIS — L8994 Pressure ulcer of unspecified site, stage 4: Secondary | ICD-10-CM

## 2016-04-03 DIAGNOSIS — E876 Hypokalemia: Secondary | ICD-10-CM

## 2016-04-03 DIAGNOSIS — G959 Disease of spinal cord, unspecified: Secondary | ICD-10-CM | POA: Diagnosis present

## 2016-04-03 DIAGNOSIS — L089 Local infection of the skin and subcutaneous tissue, unspecified: Secondary | ICD-10-CM | POA: Diagnosis present

## 2016-04-03 LAB — AEROBIC CULTURE W GRAM STAIN (SUPERFICIAL SPECIMEN)

## 2016-04-03 LAB — BASIC METABOLIC PANEL
ANION GAP: 5 (ref 5–15)
BUN: <5 mg/dL — ABNORMAL LOW (ref 6–20)
CO2: 20 mmol/L — ABNORMAL LOW (ref 22–32)
Calcium: 7 mg/dL — ABNORMAL LOW (ref 8.9–10.3)
Chloride: 108 mmol/L (ref 101–111)
Creatinine, Ser: <0.3 mg/dL — ABNORMAL LOW (ref 0.44–1.00)
GLUCOSE: 103 mg/dL — AB (ref 65–99)
POTASSIUM: 2.4 mmol/L — AB (ref 3.5–5.1)
Sodium: 133 mmol/L — ABNORMAL LOW (ref 135–145)

## 2016-04-03 LAB — AEROBIC CULTURE  (SUPERFICIAL SPECIMEN): SPECIAL REQUESTS: NORMAL

## 2016-04-03 LAB — MAGNESIUM: Magnesium: 1.3 mg/dL — ABNORMAL LOW (ref 1.7–2.4)

## 2016-04-03 MED ORDER — MAGNESIUM SULFATE 4 GM/100ML IV SOLN
4.0000 g | Freq: Once | INTRAVENOUS | Status: AC
  Administered 2016-04-03: 4 g via INTRAVENOUS
  Filled 2016-04-03: qty 100

## 2016-04-03 MED ORDER — POTASSIUM CHLORIDE CRYS ER 20 MEQ PO TBCR
40.0000 meq | EXTENDED_RELEASE_TABLET | ORAL | Status: DC
  Administered 2016-04-03: 40 meq via ORAL
  Filled 2016-04-03: qty 2

## 2016-04-03 MED ORDER — POTASSIUM CHLORIDE CRYS ER 20 MEQ PO TBCR
40.0000 meq | EXTENDED_RELEASE_TABLET | ORAL | Status: AC
Start: 2016-04-03 — End: 2016-04-03
  Administered 2016-04-03: 40 meq via ORAL
  Filled 2016-04-03 (×2): qty 2

## 2016-04-03 MED ORDER — POTASSIUM CHLORIDE 20 MEQ PO PACK
40.0000 meq | PACK | ORAL | Status: DC
  Filled 2016-04-03 (×2): qty 2
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
## 2016-04-03 NOTE — Progress Notes (Signed)
Physical Therapy Hydrotherapy Treatment  Patient Details  Name: ROGENIA LIMON MRN: [id number] Date of Birth: 1946-02-28  Today's Date: 04/03/2016 Time: [id number] Time Calculation (min): 42 min  Pt not on air mattress on arrival and L hand swollen (IV site nearby) so RN notified.  Pt repositioned to offload sacrum as well as floated heels.   04/03/16 1523  Subjective Assessment  Subjective 70 year old female with hx of cervical spine injury and quadriplegia, bedbound presented with worsening sacral pressure injury  Evaluation and Treatment  Evaluation and Treatment Procedures Explained to Patient/Family Yes  Evaluation and Treatment Procedures agreed to  Pressure Ulcer 04/02/16 Stage IV - Full thickness tissue loss with exposed bone, tendon or muscle. HYDRO Stage IV sacral pressure injury  Date First Assessed/Time First Assessed: 04/02/16 1510   Location: Sacrum  Location Orientation: Mid  Staging: Stage IV - Full thickness tissue loss with exposed bone, tendon or muscle.  Wound Description (Comments): HYDRO Stage IV sacral pressure inj...  Dressing Type Moist to moist;Foam ((WIll, PA odering Xeroform to denuded skin periwound))  Dressing Changed  Dressing Change Frequency Twice a day  State of Healing Eschar (minimal necrotic tissue)  Site / Wound Assessment Red;Yellow (sacrum palpable, more slough present today)  % Wound base Red or Granulating 85%  % Wound base Yellow 10%  % Wound base Other (Comment) 5% (tendon/ligaments of sacrum)  Peri-wound Assessment Denuded (2 denuded areas at lateral width wound edges)  Margins Unattached edges (unapproximated)  Drainage Amount Moderate  Drainage Description Serosanguineous;Serous  Treatment Debridement (Selective);Hydrotherapy (Pulse lavage);Packing (Saline gauze)  Hydrotherapy  Pulsed lavage therapy - wound location sacral pressure injury wound bed  Pulsed Lavage with Suction (psi) 8 psi  Pulsed Lavage with Suction - Normal  Saline Used 1000 mL  Pulsed Lavage Tip Tip with splash shield  Selective Debridement  Selective Debridement - Location sacral wound bed  Selective Debridement - Tools Used Scissors;Forceps  Selective Debridement - Tissue Removed minimal amount of yellow slough removed  Wound Therapy - Assess/Plan/Recommendations  Wound Therapy - Clinical Statement Pt would benefit from hydrotherapy to promote healthy wound environment, remove necrotic tissue and facilitate wound healing  Wound Therapy - Functional Problem List quadriplegia, decreased nutrition - dysphagia, incontinence  Factors Delaying/Impairing Wound Healing Incontinence;Altered sensation;Immobility  Hydrotherapy Plan Debridement;Dressing change;Patient/family education;Pulsatile lavage with suction  Wound Therapy - Frequency 6X / week  Wound Therapy - Follow Up Recommendations Home health RN  Wound Plan Will continue hydrotherapy to improve wound environment and remove necrotic tissue to facilitate wound healing.  Wound Therapy Goals - Improve the function of patient's integumentary system by progressing the wound(s) through the phases of wound healing by:  Decrease Necrotic Tissue to 0  Decrease Necrotic Tissue - Progress Progressing toward goal  Increase Granulation Tissue to 100  Increase Granulation Tissue - Progress Progressing toward goal  Patient/Family will be able to  [name] will be able to reposition pt safely and appropriately  Patient/Family Instruction Goal - Progress Progressing toward goal  Goals/treatment plan/discharge plan were made with and agreed upon by patient/family Yes  Time For Goal Achievement 7 days  Wound Therapy - Potential for Goals Good   Carmelia Bake, PT, DPT 04/03/2016 Pager: [phone]

## 2016-04-03 NOTE — Progress Notes (Signed)
LCSWA met with patient and sister Winnifred Friar at bedside. LCSWA explained reason for consult. Patient sister expressed she does not want patient to go into SNF at this time. She reports patient had a bad experience at SNF and is afraid to return. Duree tearfully expressed she removed patient from facility because she was not getting the appropriate care. She reports the patient stayed dehydrated and her wounds were not cared for. Duree reports she has been caring for patient at home, with the help of [facility] services. She reports she has difficulty getting the patient to eat because it is difficulty for the patient to swallow. She reports home health services are helping to get a machine into the home that will assist with patient would care. She reports she helps patient all other ADL's as well.  She reports the patient just received medicaid as of 8/14.   LCSWA provided emotional support. LCSWA encourgaed the sister to try other SNF's. LCSWA explained the importance of patient adequate nutrition and care. [place] educated her about speech therapy, and physical therapy working with patient at a higher level of care. LCSWA provided contact number and encouraged the sister to call if her and the patient changed their minds about SNF placement.

## 2016-04-03 NOTE — Progress Notes (Signed)
CRITICAL VALUE ALERT  Critical value received:  2.4 Potassium level  Date of notification:  04/03/16  Time of notification:  [id number]  Critical value read back:yes  Nurse who received alert:  Magda Paganini  MD notified (1st page):  Tylene Fantasia  Time of first page:  [id number]  MD notified (2nd page): Tylene Fantasia  Time of second [id number]  Responding MD: MD placed Order for Kdur 40 meq  Time MD responded:  [phone]

## 2016-04-03 NOTE — Progress Notes (Signed)
Palliative consult request received Chart reviewed and patient seen Briefly discussed with patient, refused PEG tube Call placed and discussed with sister.   PLAN: Family meeting with patient and sister 04-04-16 at 74 AM.  Full note and further recommendations to follow.   Loistine Chance MD [facility] [place] health palliative medicine team [phone]

## 2016-04-04 DIAGNOSIS — M533 Sacrococcygeal disorders, not elsewhere classified: Secondary | ICD-10-CM

## 2016-04-04 DIAGNOSIS — E43 Unspecified severe protein-calorie malnutrition: Secondary | ICD-10-CM | POA: Diagnosis present

## 2016-04-04 DIAGNOSIS — K219 Gastro-esophageal reflux disease without esophagitis: Secondary | ICD-10-CM

## 2016-04-04 DIAGNOSIS — E440 Moderate protein-calorie malnutrition: Secondary | ICD-10-CM

## 2016-04-04 DIAGNOSIS — R627 Adult failure to thrive: Secondary | ICD-10-CM

## 2016-04-04 DIAGNOSIS — Z515 Encounter for palliative care: Secondary | ICD-10-CM

## 2016-04-04 DIAGNOSIS — Z7189 Other specified counseling: Secondary | ICD-10-CM

## 2016-04-04 LAB — BASIC METABOLIC PANEL
ANION GAP: 6 (ref 5–15)
BUN: <5 mg/dL — ABNORMAL LOW (ref 6–20)
CO2: 21 mmol/L — AB (ref 22–32)
Calcium: 7.2 mg/dL — ABNORMAL LOW (ref 8.9–10.3)
Chloride: 107 mmol/L (ref 101–111)
Creatinine, Ser: <0.3 mg/dL — ABNORMAL LOW (ref 0.44–1.00)
GLUCOSE: 113 mg/dL — AB (ref 65–99)
POTASSIUM: 3 mmol/L — AB (ref 3.5–5.1)
SODIUM: 134 mmol/L — AB (ref 135–145)

## 2016-04-04 LAB — MAGNESIUM: Magnesium: 1.5 mg/dL — ABNORMAL LOW (ref 1.7–2.4)

## 2016-04-04 MED ORDER — BOOST / RESOURCE BREEZE PO LIQD: 1.0000 | Freq: Four times a day (QID) | ORAL | 0 refills | Status: AC

## 2016-04-04 MED ORDER — FENTANYL 12 MCG/HR TD PT72
12.5000 ug | MEDICATED_PATCH | TRANSDERMAL | Status: DC
  Administered 2016-04-04: 12.5 ug via TRANSDERMAL
  Filled 2016-04-04: qty 1

## 2016-04-04 MED ORDER — ONDANSETRON 4 MG PO TBDP: 4.0000 mg | ORAL_TABLET | Freq: Three times a day (TID) | ORAL | 0 refills | Status: AC | PRN

## 2016-04-04 MED ORDER — MAGNESIUM SULFATE 2 GM/50ML IV SOLN: 2.0000 g | Freq: Once | INTRAVENOUS | Status: DC

## 2016-04-04 MED ORDER — CIPROFLOXACIN HCL 500 MG PO TABS: 500.0000 mg | ORAL_TABLET | Freq: Two times a day (BID) | ORAL | 0 refills | Status: AC

## 2016-04-04 MED ORDER — CIPROFLOXACIN HCL 500 MG PO TABS
500.0000 mg | ORAL_TABLET | Freq: Two times a day (BID) | ORAL | Status: DC
  Administered 2016-04-04 – 2016-04-05 (×3): 500 mg via ORAL
  Filled 2016-04-04 (×5): qty 1

## 2016-04-04 MED ORDER — POTASSIUM CHLORIDE CRYS ER 20 MEQ PO TBCR: 40.0000 meq | EXTENDED_RELEASE_TABLET | ORAL | Status: DC

## 2016-04-04 MED ORDER — FENTANYL 12 MCG/HR TD PT72: 12.5000 ug | MEDICATED_PATCH | TRANSDERMAL | 0 refills | Status: DC

## 2016-04-04 MED ORDER — POTASSIUM CHLORIDE 20 MEQ PO PACK: 40.0000 meq | PACK | ORAL | Status: DC

## 2016-04-04 MED ORDER — POTASSIUM CHLORIDE CRYS ER 20 MEQ PO TBCR
40.0000 meq | EXTENDED_RELEASE_TABLET | ORAL | Status: AC
  Administered 2016-04-04 (×2): 40 meq via ORAL
  Filled 2016-04-04 (×2): qty 2

## 2016-04-04 MED ORDER — SENNA 8.6 MG PO TABS
1.0000 | ORAL_TABLET | Freq: Every day | ORAL | Status: DC
  Filled 2016-04-04: qty 1
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
## 2016-04-04 NOTE — Care Management Important Message (Signed)
Important Message  Patient Details  Name: SHIZUYE MESSAMORE MRN: [id number] Date of Birth: 06/12/1946   Medicare Important Message Given:  Yes    Camillo Flaming 04/04/2016, 11:21 AMImportant Message  Patient Details  Name: KANIAH PIERCY MRN: [id number] Date of Birth: 1945-09-19   Medicare Important Message Given:  Yes    Camillo Flaming 04/04/2016, 11:21 AM

## 2016-04-04 NOTE — Discharge Summary (Addendum)
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 <=0.25 SENSITIVE Sensitive     GENTAMICIN 4 SENSITIVE Sensitive     IMIPENEM 2 SENSITIVE Sensitive     PIP/TAZO 8 SENSITIVE Sensitive     CEFEPIME 2 SENSITIVE Sensitive     * FEW PSEUDOMONAS AERUGINOSA     Time coordinating discharge: Over 30 minutes  SIGNED:   Louellen Molder, MD  [facility] 04/04/2016, 1:38 PM Pager   If 7PM-7AM, please contact night-coverage [email or web address] Password [email or web address]

## 2016-04-04 NOTE — Progress Notes (Signed)
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 HYDRO Stage IV sacral pressure injury  Date First Assessed/Time First Assessed: 04/02/16 1510   Location: Sacrum  Location Orientation: Mid  Staging: Stage IV - Full thickness tissue loss with exposed bone, tendon or muscle.  Wound Description (Comments): HYDRO Stage IV sacral pressure inj...  Dressing Type Moist to moist;Foam ((no Xeroform present))  Dressing Changed  Dressing Change Frequency Twice a day  State of Healing Eschar (minimal necrotic tissue)  Site / Wound Assessment Red;Yellow (sacrum palpable, more slough present today)  % Wound base Red or Granulating 85%  % Wound base Yellow 5%  % Wound base Other (Comment) 10% (tendon/ligaments of sacrum)  Peri-wound Assessment Denuded (2 denuded areas at lateral width wound edges)  Margins Unattached edges (unapproximated)  Drainage Amount Minimal  Drainage Description Serosanguineous;Serous  Treatment Debridement (Selective);Hydrotherapy (Pulse lavage);Packing (Saline gauze)  Hydrotherapy  Pulsed lavage therapy - wound location sacral pressure injury wound bed  Pulsed Lavage with Suction (psi) 8 psi  Pulsed Lavage with Suction - Normal Saline Used 1000 mL  Pulsed Lavage Tip Tip with splash shield  Selective Debridement  Selective Debridement - Location sacral wound bed  Selective Debridement - Tools Used  Scissors;Forceps  Selective Debridement - Tissue Removed minimal amount of yellow slough removed  Wound Therapy - Assess/Plan/Recommendations  Wound Therapy - Clinical Statement Pt would benefit from hydrotherapy to promote healthy wound environment, remove necrotic tissue and facilitate wound healing  Wound Therapy - Functional Problem List quadriplegia, decreased nutrition - dysphagia, incontinence  Factors Delaying/Impairing Wound Healing Incontinence;Altered sensation;Immobility  Hydrotherapy Plan Debridement;Dressing change;Patient/family education;Pulsatile lavage with suction  Wound Therapy - Frequency 6X / week  Wound Therapy - Follow Up Recommendations Home health RN  Wound Plan Will continue hydrotherapy to improve wound environment and remove necrotic tissue to facilitate wound healing.  Pt likely to d/c home today.  Wound Therapy Goals - Improve the function of patient's integumentary system by progressing the wound(s) through the phases of wound healing by:  Decrease Necrotic Tissue to 0  Decrease Necrotic Tissue - Progress Progressing toward goal  Increase Granulation Tissue to 100  Increase Granulation Tissue - Progress Progressing toward goal  Patient/Family will be able to  [name] will be able to reposition pt safely and appropriately  Patient/Family Instruction Goal - Progress Progressing toward goal  Goals/treatment plan/discharge plan were made with and agreed upon by patient/family Yes  Time For Goal Achievement 7 days  Wound Therapy - Potential for Goals Good   Carmelia Bake, PT, DPT 04/04/2016 Pager: [phone]
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
## 2016-04-04 NOTE — Progress Notes (Addendum)
[id number] Jonluke Cobbins,BSN,RN3,CCM:Spoke with sister of the patient who is caregiver.  Has hospital bed and wheelchair at the house still needs air mattress/hhc is through [facility] will send information for home care.tct-advanced dme-jermanine/notified of the need for the air mattress sister's phone number given for contact. tct -Ruffin Pyo with [facility] aware of needs for nursing care, wound care, possible discharge today.

## 2016-04-04 NOTE — Care Management Note (Signed)
Case Management Note  Patient Details  Name: [name] MRN: [id number] Date of Birth: October 29, 1945  Subjective/Objective:     Wound infection               Action/Plan: home with hhc   Expected Discharge Date:   (unknown)               Expected Discharge Plan:     In-House Referral:     Discharge planning Services     Post Acute Care Choice:    Choice offered to:     DME Arranged:    DME Agency:     HH Arranged:    HH Agency:     Status of Service:     If discussed at [employer], dates discussed:    Additional Comments: Date:  April 04, 2016 Chart reviewed for concurrent status and case management needs. Will continue to follow the patient for status change: Discharge Planning: following for needs Expected discharge date: [id number] Velva Harman, BSN, [place], [place] Leeroy Cha, RN 04/04/2016, 10:21 AM

## 2016-04-05 LAB — CULTURE, BLOOD (ROUTINE X 2)
CULTURE: NO GROWTH
CULTURE: NO GROWTH

## 2016-04-05 NOTE — Progress Notes (Signed)
Pt will leave this evening with [facility], going to her sister's house. Pt will have [place] [facility] to come to home for PT, RN, SW and wound care. Wound care has been performed by PT today. Pt alert, oriented, and without c/o. Followup appointments noted.  Sister aware of transfer and has gone home at this time to await arrival of pt.

## 2016-04-05 NOTE — Care Management Note (Addendum)
Case Management Note  Patient Details  Name: [name] MRN: [id number] Date of Birth: 1946/01/16  Subjective/Objective:  Quadriplegia/bed bound, sacral decubitus IV                   Action/Plan: Discharge Planning: AVS reviewed:  1230 pm Pt active with [facility]. Contacted [facility] Liaison to make aware of dc home today with HH. HH aide and SW to assist with PCS services added to [facility] RN orders. Contacted Jerm[place], [facility] DME rep for low air loss mattress to be delivered to home.   60 NCM spoke to pt and pt's sister, [name] [phone] at bedside. Sister states pt now has Medicaid and would like to see if pt could receive assistance at home. Provided sister with [place] PCS application to take to pt's PCP. If pt qualifies she can have aide that comes daily to assist with ADL's. Pt has hospital bed at home.     Expected Discharge Date:  04/05/2016             Expected Discharge Plan:  [place]  In-House Referral:  NA  Discharge planning Services  CM Consult  Post Acute Care Choice:  Home Health, Resumption of Svcs/PTA Provider Choice offered to:  Patient  DME Arranged:  Speciality mattress DME Agency:  [place]  HH Arranged:  RN, PT, Social Work, aide [place] Agency:  [place]  Status of Service:  Completed, signed off  If discussed at [employer] of Stay Meetings, dates discussed:    Additional Comments:  Erenest Rasher, RN 04/05/2016, 12:30 PM
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
## 2016-04-05 NOTE — Progress Notes (Signed)
  Hydrotherapy treatment    04/05/16 1200  Subjective Assessment  Subjective 70 year old female with hx of cervical spine injury and quadriplegia, bedbound presented with worsening sacral pressure injury  Evaluation and Treatment  Evaluation and Treatment Procedures Explained to Patient/Family Yes  Evaluation and Treatment Procedures agreed to  Pressure Ulcer 04/02/16 Stage IV - Full thickness tissue loss with exposed bone, tendon or muscle. HYDRO Stage IV sacral pressure injury  Date First Assessed/Time First Assessed: 04/02/16 1510   Location: Sacrum  Location Orientation: Mid  Staging: Stage IV - Full thickness tissue loss with exposed bone, tendon or muscle.  Wound Description (Comments): HYDRO Stage IV sacral pressure inj...  Dressing Type Moist to moist;Foam  Dressing Changed  Dressing Change Frequency Daily  Site / Wound Assessment Red;Pink;Yellow;Black  % Wound base Red or Granulating 85%  % Wound base Yellow 5%  % Wound base Other (Comment) 10% (connective tissue)  Peri-wound Assessment Denuded  Margins Unattached edges (unapproximated)  Drainage Amount Minimal  Drainage Description Serosanguineous  Treatment Hydrotherapy (Pulse lavage);Packing (Saline gauze)  Hydrotherapy  Pulsed lavage therapy - wound location sacral pressure injury wound bed  Pulsed Lavage with Suction (psi) 8 psi  Pulsed Lavage with Suction - Normal Saline Used 1000 mL  Pulsed Lavage Tip Tip with splash shield  Wound Therapy - Assess/Plan/Recommendations  Wound Therapy - Clinical Statement Pt would benefit from hydrotherapy to promote healthy wound environment, remove necrotic tissue and facilitate wound healing  Wound Therapy - Functional Problem List quadriplegia, decreased nutrition - dysphagia, incontinence  Factors Delaying/Impairing Wound Healing Incontinence;Altered sensation;Immobility  Hydrotherapy Plan Debridement;Dressing change;Patient/family education;Pulsatile lavage with suction  Wound  Therapy - Frequency 6X / week  Wound Therapy - Current Recommendations Case manager/social work  Wound Therapy - Follow Up Recommendations Home health RN  Wound Plan Will continue hydrotherapy to improve wound environment and remove necrotic tissue to facilitate wound healing.  Wound Therapy Goals - Improve the function of patient's integumentary system by progressing the wound(s) through the phases of wound healing by:  Decrease Necrotic Tissue - Progress Progressing toward goal  Increase Granulation Tissue - Progress Progressing toward goal  Patient/Family Instruction Goal - Progress Progressing toward goal  Time For Goal Achievement 7 days  Wound Therapy - Potential for Goals Good   Weston Anna, MPT [phone]

## 2016-04-05 NOTE — Progress Notes (Signed)
[facility] arrived and will be transporting pt to sister's house. Sister , Ms. Duree was contacted and made aware. Pt alert and oriented

## 2016-04-06 DIAGNOSIS — L89150 Pressure ulcer of sacral region, unstageable: Secondary | ICD-10-CM | POA: Diagnosis not present

## 2016-04-06 DIAGNOSIS — M4712 Other spondylosis with myelopathy, cervical region: Secondary | ICD-10-CM

## 2016-04-06 DIAGNOSIS — R1313 Dysphagia, pharyngeal phase: Secondary | ICD-10-CM | POA: Diagnosis not present

## 2016-04-06 DIAGNOSIS — I10 Essential (primary) hypertension: Secondary | ICD-10-CM

## 2016-04-06 DIAGNOSIS — T8189XD Other complications of procedures, not elsewhere classified, subsequent encounter: Secondary | ICD-10-CM | POA: Diagnosis not present

## 2016-04-06 DIAGNOSIS — F411 Generalized anxiety disorder: Secondary | ICD-10-CM

## 2016-04-06 DIAGNOSIS — G8252 Quadriplegia, C1-C4 incomplete: Secondary | ICD-10-CM | POA: Diagnosis not present

## 2016-04-07 ENCOUNTER — Telehealth: Payer: Self-pay | Admitting: Family Medicine

## 2016-04-07 DIAGNOSIS — Z742 Need for assistance at home and no other household member able to render care: Secondary | ICD-10-CM

## 2016-04-07 NOTE — Telephone Encounter (Signed)
Duree, sister,  has brought pt home to [facility] care of her.  They are requesting an air mattress which hopefully you have the order for. Also, they are going to need home health nurse. Sister was given paperwork and she is going to drop that by the office.

## 2016-04-07 NOTE — Telephone Encounter (Signed)
Noted. Home health referral has been placed. Will wait for daughter to bring paperwork for mattress.

## 2016-04-08 ENCOUNTER — Telehealth: Payer: Self-pay | Admitting: Family Medicine

## 2016-04-08 ENCOUNTER — Telehealth: Payer: Self-pay | Admitting: *Deleted

## 2016-04-08 NOTE — Telephone Encounter (Signed)
I called Larena Glassman and informed her of this and she stated she will contact Suanne Marker and have the order sent to Dr Sarajane Jews to be signed.

## 2016-04-08 NOTE — Telephone Encounter (Signed)
[name] is requesting order for wound vac call to [place] [name]

## 2016-04-08 NOTE — Telephone Encounter (Signed)
noted 

## 2016-04-08 NOTE — Telephone Encounter (Signed)
Buzz nurse, called from [place] stating the pt was in [place] recently for an unstable pressure ulcer on the coccyx which is 6cm long, 16 cm wide, and 3 cm deep and the hospital gave IV antibiotics, fluids and recommended pallative care and Hospice and the pts sister has refused this.  Buzz stated he is concerned the pt could become septic and die as a wound vac will not work, she is in a lot of pain, not eating or drinking at all and he wanted to know if Dr Sarajane Jews knew the sister and/or could call her to convince her to allow care from Hospice?  Buzz can be reached at [phone].

## 2016-04-08 NOTE — Telephone Encounter (Signed)
Please call this in  

## 2016-04-09 DIAGNOSIS — M4712 Other spondylosis with myelopathy, cervical region: Secondary | ICD-10-CM | POA: Diagnosis not present

## 2016-04-09 DIAGNOSIS — M797 Fibromyalgia: Secondary | ICD-10-CM | POA: Diagnosis not present

## 2016-04-09 DIAGNOSIS — L89159 Pressure ulcer of sacral region, unspecified stage: Secondary | ICD-10-CM | POA: Diagnosis not present

## 2016-04-09 DIAGNOSIS — R627 Adult failure to thrive: Secondary | ICD-10-CM

## 2016-04-09 NOTE — Telephone Encounter (Signed)
I spoke with pt's sister and she is going to talk this over with home care nurse today. I advised that if we need to initiate this, then she will need to call us back. Most like [facility] will put in referral. Dr. Sarajane Jews is aware of this.

## 2016-04-09 NOTE — Telephone Encounter (Signed)
Please speak to her sister and teel that I also recommend getting Hospice involved

## 2016-04-11 ENCOUNTER — Telehealth: Payer: Self-pay | Admitting: Family Medicine

## 2016-04-11 NOTE — Telephone Encounter (Signed)
Per Dr. Sarajane Jews okay to give verbal order and I did call Don with [facility] and leave a voice message for below verbal order.

## 2016-04-11 NOTE — Telephone Encounter (Signed)
Need to see pt 1 time a week for 3 weeks for ADL exercise fall prevention health promotion infection control and okay to reevaluate week 9/25 for continued OT and new cerfication period effect 9/30.

## 2016-04-14 ENCOUNTER — Encounter (HOSPITAL_BASED_OUTPATIENT_CLINIC_OR_DEPARTMENT_OTHER): Payer: Medicare Other | Attending: Internal Medicine

## 2016-04-14 ENCOUNTER — Ambulatory Visit (HOSPITAL_COMMUNITY): Admission: RE | Admit: 2016-04-14 | Payer: Medicare Other | Source: Ambulatory Visit

## 2016-04-14 DIAGNOSIS — E43 Unspecified severe protein-calorie malnutrition: Secondary | ICD-10-CM | POA: Diagnosis not present

## 2016-04-14 DIAGNOSIS — L89154 Pressure ulcer of sacral region, stage 4: Secondary | ICD-10-CM | POA: Diagnosis present

## 2016-04-14 DIAGNOSIS — M4712 Other spondylosis with myelopathy, cervical region: Secondary | ICD-10-CM | POA: Insufficient documentation

## 2016-04-28 ENCOUNTER — Encounter (HOSPITAL_BASED_OUTPATIENT_CLINIC_OR_DEPARTMENT_OTHER): Payer: Medicare Other | Attending: Internal Medicine

## 2016-05-28 ENCOUNTER — Emergency Department (HOSPITAL_COMMUNITY): Payer: Medicare Other

## 2016-05-28 ENCOUNTER — Inpatient Hospital Stay (HOSPITAL_COMMUNITY)
Admission: EM | Admit: 2016-05-28 | Discharge: 2016-06-27 | DRG: 871 | Disposition: E | Payer: Medicare Other | Attending: Internal Medicine | Admitting: Internal Medicine

## 2016-05-28 ENCOUNTER — Encounter (HOSPITAL_COMMUNITY): Payer: Self-pay | Admitting: Emergency Medicine

## 2016-05-28 ENCOUNTER — Inpatient Hospital Stay (HOSPITAL_COMMUNITY): Payer: Medicare Other

## 2016-05-28 DIAGNOSIS — R532 Functional quadriplegia: Secondary | ICD-10-CM | POA: Diagnosis present

## 2016-05-28 DIAGNOSIS — J189 Pneumonia, unspecified organism: Secondary | ICD-10-CM | POA: Diagnosis present

## 2016-05-28 DIAGNOSIS — Z7189 Other specified counseling: Secondary | ICD-10-CM | POA: Diagnosis not present

## 2016-05-28 DIAGNOSIS — J690 Pneumonitis due to inhalation of food and vomit: Secondary | ICD-10-CM | POA: Diagnosis present

## 2016-05-28 DIAGNOSIS — Z885 Allergy status to narcotic agent status: Secondary | ICD-10-CM

## 2016-05-28 DIAGNOSIS — Z8249 Family history of ischemic heart disease and other diseases of the circulatory system: Secondary | ICD-10-CM

## 2016-05-28 DIAGNOSIS — R6521 Severe sepsis with septic shock: Secondary | ICD-10-CM | POA: Diagnosis present

## 2016-05-28 DIAGNOSIS — K219 Gastro-esophageal reflux disease without esophagitis: Secondary | ICD-10-CM | POA: Diagnosis present

## 2016-05-28 DIAGNOSIS — A419 Sepsis, unspecified organism: Principal | ICD-10-CM | POA: Diagnosis present

## 2016-05-28 DIAGNOSIS — R579 Shock, unspecified: Secondary | ICD-10-CM

## 2016-05-28 DIAGNOSIS — Z7401 Bed confinement status: Secondary | ICD-10-CM

## 2016-05-28 DIAGNOSIS — J9601 Acute respiratory failure with hypoxia: Secondary | ICD-10-CM

## 2016-05-28 DIAGNOSIS — Z66 Do not resuscitate: Secondary | ICD-10-CM | POA: Diagnosis present

## 2016-05-28 DIAGNOSIS — E785 Hyperlipidemia, unspecified: Secondary | ICD-10-CM | POA: Diagnosis present

## 2016-05-28 DIAGNOSIS — Y95 Nosocomial condition: Secondary | ICD-10-CM | POA: Diagnosis present

## 2016-05-28 DIAGNOSIS — G8220 Paraplegia, unspecified: Secondary | ICD-10-CM | POA: Diagnosis present

## 2016-05-28 DIAGNOSIS — E876 Hypokalemia: Secondary | ICD-10-CM | POA: Diagnosis present

## 2016-05-28 DIAGNOSIS — R0600 Dyspnea, unspecified: Secondary | ICD-10-CM | POA: Diagnosis not present

## 2016-05-28 DIAGNOSIS — Z841 Family history of disorders of kidney and ureter: Secondary | ICD-10-CM

## 2016-05-28 DIAGNOSIS — M797 Fibromyalgia: Secondary | ICD-10-CM | POA: Diagnosis present

## 2016-05-28 DIAGNOSIS — Z515 Encounter for palliative care: Secondary | ICD-10-CM | POA: Diagnosis not present

## 2016-05-28 DIAGNOSIS — D638 Anemia in other chronic diseases classified elsewhere: Secondary | ICD-10-CM | POA: Diagnosis present

## 2016-05-28 DIAGNOSIS — L89890 Pressure ulcer of other site, unstageable: Secondary | ICD-10-CM | POA: Diagnosis present

## 2016-05-28 DIAGNOSIS — L89154 Pressure ulcer of sacral region, stage 4: Secondary | ICD-10-CM | POA: Diagnosis present

## 2016-05-28 DIAGNOSIS — E43 Unspecified severe protein-calorie malnutrition: Secondary | ICD-10-CM | POA: Diagnosis present

## 2016-05-28 DIAGNOSIS — E872 Acidosis, unspecified: Secondary | ICD-10-CM

## 2016-05-28 DIAGNOSIS — G9340 Encephalopathy, unspecified: Secondary | ICD-10-CM | POA: Diagnosis present

## 2016-05-28 DIAGNOSIS — R627 Adult failure to thrive: Secondary | ICD-10-CM | POA: Diagnosis present

## 2016-05-28 DIAGNOSIS — L89113 Pressure ulcer of right upper back, stage 3: Secondary | ICD-10-CM | POA: Diagnosis present

## 2016-05-28 DIAGNOSIS — Z808 Family history of malignant neoplasm of other organs or systems: Secondary | ICD-10-CM

## 2016-05-28 DIAGNOSIS — F419 Anxiety disorder, unspecified: Secondary | ICD-10-CM | POA: Diagnosis present

## 2016-05-28 DIAGNOSIS — Z79899 Other long term (current) drug therapy: Secondary | ICD-10-CM

## 2016-05-28 DIAGNOSIS — R911 Solitary pulmonary nodule: Secondary | ICD-10-CM

## 2016-05-28 DIAGNOSIS — R601 Generalized edema: Secondary | ICD-10-CM | POA: Diagnosis present

## 2016-05-28 DIAGNOSIS — E871 Hypo-osmolality and hyponatremia: Secondary | ICD-10-CM | POA: Diagnosis present

## 2016-05-28 DIAGNOSIS — R1310 Dysphagia, unspecified: Secondary | ICD-10-CM | POA: Diagnosis present

## 2016-05-28 DIAGNOSIS — J90 Pleural effusion, not elsewhere classified: Secondary | ICD-10-CM | POA: Diagnosis present

## 2016-05-28 DIAGNOSIS — K7200 Acute and subacute hepatic failure without coma: Secondary | ICD-10-CM | POA: Diagnosis present

## 2016-05-28 DIAGNOSIS — R578 Other shock: Secondary | ICD-10-CM | POA: Diagnosis present

## 2016-05-28 DIAGNOSIS — M4712 Other spondylosis with myelopathy, cervical region: Secondary | ICD-10-CM | POA: Diagnosis present

## 2016-05-28 DIAGNOSIS — L89123 Pressure ulcer of left upper back, stage 3: Secondary | ICD-10-CM | POA: Diagnosis present

## 2016-05-28 DIAGNOSIS — Z7982 Long term (current) use of aspirin: Secondary | ICD-10-CM

## 2016-05-28 DIAGNOSIS — I10 Essential (primary) hypertension: Secondary | ICD-10-CM | POA: Diagnosis present

## 2016-05-28 DIAGNOSIS — L89159 Pressure ulcer of sacral region, unspecified stage: Secondary | ICD-10-CM | POA: Diagnosis present

## 2016-05-28 DIAGNOSIS — Z888 Allergy status to other drugs, medicaments and biological substances status: Secondary | ICD-10-CM

## 2016-05-28 DIAGNOSIS — Z981 Arthrodesis status: Secondary | ICD-10-CM

## 2016-05-28 DIAGNOSIS — R680 Hypothermia, not associated with low environmental temperature: Secondary | ICD-10-CM | POA: Diagnosis not present

## 2016-05-28 DIAGNOSIS — J181 Lobar pneumonia, unspecified organism: Secondary | ICD-10-CM | POA: Diagnosis not present

## 2016-05-28 DIAGNOSIS — Z9889 Other specified postprocedural states: Secondary | ICD-10-CM

## 2016-05-28 DIAGNOSIS — Z87891 Personal history of nicotine dependence: Secondary | ICD-10-CM

## 2016-05-28 DIAGNOSIS — Z881 Allergy status to other antibiotic agents status: Secondary | ICD-10-CM

## 2016-05-28 DIAGNOSIS — Z8261 Family history of arthritis: Secondary | ICD-10-CM

## 2016-05-28 LAB — COMPREHENSIVE METABOLIC PANEL
ALBUMIN: 1.3 g/dL — AB (ref 3.5–5.0)
ALK PHOS: 131 U/L — AB (ref 38–126)
ALT: 60 U/L — AB (ref 14–54)
ALT: 86 U/L — AB (ref 14–54)
AST: 158 U/L — AB (ref 15–41)
AST: 233 U/L — ABNORMAL HIGH (ref 15–41)
Albumin: 1.2 g/dL — ABNORMAL LOW (ref 3.5–5.0)
Alkaline Phosphatase: 124 U/L (ref 38–126)
Anion gap: 10 (ref 5–15)
Anion gap: 9 (ref 5–15)
BILIRUBIN TOTAL: 0.9 mg/dL (ref 0.3–1.2)
BUN: 8 mg/dL (ref 6–20)
BUN: 6 mg/dL (ref 6–20)
CALCIUM: 6.9 mg/dL — AB (ref 8.9–10.3)
CHLORIDE: 104 mmol/L (ref 101–111)
CO2: 19 mmol/L — ABNORMAL LOW (ref 22–32)
CO2: 17 mmol/L — ABNORMAL LOW (ref 22–32)
CREATININE: 0.31 mg/dL — AB (ref 0.44–1.00)
CREATININE: 0.35 mg/dL — AB (ref 0.44–1.00)
Calcium: 6.2 mg/dL — CL (ref 8.9–10.3)
Chloride: 100 mmol/L — ABNORMAL LOW (ref 101–111)
GFR calc Af Amer: >60 mL/min (ref >60)
GLUCOSE: 80 mg/dL (ref 65–99)
Glucose, Bld: 89 mg/dL (ref 65–99)
POTASSIUM: 2.9 mmol/L — AB (ref 3.5–5.1)
POTASSIUM: 2.8 mmol/L — AB (ref 3.5–5.1)
Sodium: 129 mmol/L — ABNORMAL LOW (ref 135–145)
Sodium: 130 mmol/L — ABNORMAL LOW (ref 135–145)
TOTAL PROTEIN: 4.1 g/dL — AB (ref 6.5–8.1)
TOTAL PROTEIN: 3.6 g/dL — AB (ref 6.5–8.1)
Total Bilirubin: 0.9 mg/dL (ref 0.3–1.2)

## 2016-05-28 LAB — URINALYSIS, ROUTINE W REFLEX MICROSCOPIC
BILIRUBIN URINE: NEGATIVE
GLUCOSE, UA: NEGATIVE mg/dL
GLUCOSE, UA: NEGATIVE mg/dL
HGB URINE DIPSTICK: NEGATIVE
Hgb urine dipstick: NEGATIVE
KETONES UR: NEGATIVE mg/dL
Ketones, ur: NEGATIVE mg/dL
LEUKOCYTES UA: NEGATIVE
Leukocytes, UA: NEGATIVE
Nitrite: NEGATIVE
Nitrite: NEGATIVE
PH: 6 (ref 5.0–8.0)
PROTEIN: NEGATIVE mg/dL
Protein, ur: NEGATIVE mg/dL
SPECIFIC GRAVITY, URINE: 1.027 (ref 1.005–1.030)
Specific Gravity, Urine: 1.014 (ref 1.005–1.030)
pH: 6.5 (ref 5.0–8.0)

## 2016-05-28 LAB — I-STAT CG4 LACTIC ACID, ED
LACTIC ACID, VENOUS: 3.11 mmol/L — AB (ref 0.5–1.9)
LACTIC ACID, VENOUS: 3.51 mmol/L — AB (ref 0.5–1.9)

## 2016-05-28 LAB — CBC WITH DIFFERENTIAL/PLATELET
BASOS ABS: 0 10*3/uL (ref 0.0–0.1)
BASOS PCT: 0 %
Eosinophils Absolute: 0 10*3/uL (ref 0.0–0.7)
Eosinophils Relative: 0 %
HEMATOCRIT: 26.7 % — AB (ref 36.0–46.0)
HEMOGLOBIN: 9.3 g/dL — AB (ref 12.0–15.0)
LYMPHS PCT: 5 %
Lymphs Abs: 0.4 10*3/uL — ABNORMAL LOW (ref 0.7–4.0)
MCH: 29.2 pg (ref 26.0–34.0)
MCHC: 34.8 g/dL (ref 30.0–36.0)
MCV: 84 fL (ref 78.0–100.0)
MONO ABS: 0.2 10*3/uL (ref 0.1–1.0)
Monocytes Relative: 3 %
NEUTROS ABS: 7.5 10*3/uL (ref 1.7–7.7)
NEUTROS PCT: 92 %
Platelets: 404 10*3/uL — ABNORMAL HIGH (ref 150–400)
RBC: 3.18 MIL/uL — ABNORMAL LOW (ref 3.87–5.11)
RDW: 16.3 % — AB (ref 11.5–15.5)
WBC: 8.2 10*3/uL (ref 4.0–10.5)

## 2016-05-28 LAB — I-STAT TROPONIN, ED
Troponin i, poc: 0 ng/mL (ref 0.00–0.08)
Troponin i, poc: 0 ng/mL (ref 0.00–0.08)

## 2016-05-28 LAB — CBC
HEMATOCRIT: 25.3 % — AB (ref 36.0–46.0)
HEMOGLOBIN: 8.9 g/dL — AB (ref 12.0–15.0)
MCH: 29.9 pg (ref 26.0–34.0)
MCHC: 35.2 g/dL (ref 30.0–36.0)
MCV: 84.9 fL (ref 78.0–100.0)
Platelets: 384 10*3/uL (ref 150–400)
RBC: 2.98 MIL/uL — AB (ref 3.87–5.11)
RDW: 16.6 % — AB (ref 11.5–15.5)
WBC: 10.2 10*3/uL (ref 4.0–10.5)

## 2016-05-28 LAB — MRSA PCR SCREENING: MRSA by PCR: NEGATIVE

## 2016-05-28 LAB — LACTIC ACID, PLASMA
LACTIC ACID, VENOUS: 4.1 mmol/L — AB (ref 0.5–1.9)
LACTIC ACID, VENOUS: 5 mmol/L — AB (ref 0.5–1.9)

## 2016-05-28 LAB — APTT: APTT: 38 s — AB (ref 24–36)

## 2016-05-28 LAB — CORTISOL: Cortisol, Plasma: 40.9 ug/dL

## 2016-05-28 LAB — ABO/RH: ABO/RH(D): B POS

## 2016-05-28 LAB — PROCALCITONIN: Procalcitonin: 54.22 ng/mL

## 2016-05-28 MED ORDER — SODIUM CHLORIDE 0.9% FLUSH
3.0000 mL | Freq: Two times a day (BID) | INTRAVENOUS | Status: DC
  Administered 2016-05-28 – 2016-05-31 (×6): 3 mL via INTRAVENOUS

## 2016-05-28 MED ORDER — MEGESTROL ACETATE 20 MG PO TABS
20.0000 mg | ORAL_TABLET | Freq: Every day | ORAL | Status: DC
  Filled 2016-05-28: qty 1

## 2016-05-28 MED ORDER — SODIUM CHLORIDE 0.9 % IV BOLUS (SEPSIS)
1000.0000 mL | Freq: Once | INTRAVENOUS | Status: AC
  Administered 2016-05-28: 1000 mL via INTRAVENOUS

## 2016-05-28 MED ORDER — POTASSIUM CHLORIDE 10 MEQ/100ML IV SOLN
INTRAVENOUS | Status: AC
  Filled 2016-05-28: qty 100

## 2016-05-28 MED ORDER — PHENYLEPHRINE HCL 10 MG/ML IJ SOLN
30.0000 ug/min | INTRAVENOUS | Status: DC
  Administered 2016-05-28: 50 ug/min via INTRAVENOUS
  Filled 2016-05-28: qty 4

## 2016-05-28 MED ORDER — MAGNESIUM SULFATE 2 GM/50ML IV SOLN
2.0000 g | Freq: Once | INTRAVENOUS | Status: AC
  Administered 2016-05-28: 2 g via INTRAVENOUS
  Filled 2016-05-28: qty 50

## 2016-05-28 MED ORDER — PIPERACILLIN-TAZOBACTAM 3.375 G IVPB: 3.3750 g | Freq: Every day | INTRAVENOUS | Status: DC

## 2016-05-28 MED ORDER — POTASSIUM CHLORIDE IN NACL 40-0.9 MEQ/L-% IV SOLN
INTRAVENOUS | Status: DC
  Administered 2016-05-28: 125 mL/h via INTRAVENOUS
  Filled 2016-05-28: qty 1000

## 2016-05-28 MED ORDER — SODIUM CHLORIDE 0.9 % IV BOLUS (SEPSIS)
2000.0000 mL | Freq: Once | INTRAVENOUS | Status: AC
  Administered 2016-05-28: 2000 mL via INTRAVENOUS

## 2016-05-28 MED ORDER — FAMOTIDINE 20 MG PO TABS: 20.0000 mg | ORAL_TABLET | Freq: Every day | ORAL | Status: DC

## 2016-05-28 MED ORDER — ENOXAPARIN SODIUM 40 MG/0.4ML ~~LOC~~ SOLN
40.0000 mg | SUBCUTANEOUS | Status: DC
  Administered 2016-05-28 – 2016-05-29 (×2): 40 mg via SUBCUTANEOUS
  Filled 2016-05-28 (×2): qty 0.4

## 2016-05-28 MED ORDER — COLLAGENASE 250 UNIT/GM EX OINT
TOPICAL_OINTMENT | Freq: Every day | CUTANEOUS | Status: DC
  Administered 2016-05-28 – 2016-06-01 (×6): via TOPICAL
  Filled 2016-05-28: qty 30

## 2016-05-28 MED ORDER — ONDANSETRON 4 MG PO TBDP: 4.0000 mg | ORAL_TABLET | Freq: Three times a day (TID) | ORAL | Status: DC | PRN

## 2016-05-28 MED ORDER — ACETAMINOPHEN 325 MG PO TABS: 650.0000 mg | ORAL_TABLET | Freq: Every day | ORAL | Status: DC

## 2016-05-28 MED ORDER — POTASSIUM CHLORIDE 10 MEQ/100ML IV SOLN
10.0000 meq | INTRAVENOUS | Status: AC
  Administered 2016-05-28 (×3): 10 meq via INTRAVENOUS
  Filled 2016-05-28 (×2): qty 100

## 2016-05-28 MED ORDER — PIPERACILLIN-TAZOBACTAM 3.375 G IVPB 30 MIN
3.3750 g | INTRAVENOUS | Status: AC
  Administered 2016-05-28: 3.375 g via INTRAVENOUS
  Filled 2016-05-28: qty 50

## 2016-05-28 MED ORDER — ALBUMIN HUMAN 5 % IV SOLN
12.5000 g | Freq: Three times a day (TID) | INTRAVENOUS | Status: AC
  Administered 2016-05-28 – 2016-05-29 (×3): 12.5 g via INTRAVENOUS
  Filled 2016-05-28 (×3): qty 250

## 2016-05-28 MED ORDER — POLYETHYLENE GLYCOL 3350 17 G PO PACK
17.0000 g | PACK | Freq: Every day | ORAL | Status: DC
  Filled 2016-05-28 (×2): qty 1

## 2016-05-28 MED ORDER — POTASSIUM CHLORIDE CRYS ER 20 MEQ PO TBCR
40.0000 meq | EXTENDED_RELEASE_TABLET | Freq: Once | ORAL | Status: DC
  Filled 2016-05-28: qty 2

## 2016-05-28 MED ORDER — FAMOTIDINE IN NACL 20-0.9 MG/50ML-% IV SOLN
20.0000 mg | INTRAVENOUS | Status: DC
  Administered 2016-05-28 – 2016-06-01 (×6): 20 mg via INTRAVENOUS
  Filled 2016-05-28 (×6): qty 50

## 2016-05-28 MED ORDER — SODIUM CHLORIDE 0.9 % IV SOLN
Freq: Once | INTRAVENOUS | Status: AC
  Administered 2016-05-28: 10:00:00 via INTRAVENOUS

## 2016-05-28 MED ORDER — MAGNESIUM OXIDE 400 (241.3 MG) MG PO TABS: 400.0000 mg | ORAL_TABLET | Freq: Every day | ORAL | Status: DC

## 2016-05-28 MED ORDER — ALPRAZOLAM 0.25 MG PO TABS: 0.2500 mg | ORAL_TABLET | Freq: Every evening | ORAL | Status: DC | PRN

## 2016-05-28 MED ORDER — ASPIRIN 300 MG RE SUPP
150.0000 mg | Freq: Every day | RECTAL | Status: DC
  Administered 2016-05-28 – 2016-05-30 (×3): 150 mg via RECTAL
  Filled 2016-05-28 (×4): qty 1

## 2016-05-28 MED ORDER — POTASSIUM CHLORIDE IN NACL 40-0.9 MEQ/L-% IV SOLN: INTRAVENOUS | Status: AC

## 2016-05-28 MED ORDER — ASPIRIN EC 81 MG PO TBEC: 81.0000 mg | DELAYED_RELEASE_TABLET | Freq: Every day | ORAL | Status: DC

## 2016-05-28 MED ORDER — SODIUM CHLORIDE 0.9 % IV SOLN
INTRAVENOUS | Status: DC
  Administered 2016-05-28: 11:00:00 via INTRAVENOUS

## 2016-05-28 MED ORDER — PIPERACILLIN-TAZOBACTAM 3.375 G IVPB
3.3750 g | Freq: Three times a day (TID) | INTRAVENOUS | Status: DC
  Administered 2016-05-28 – 2016-06-01 (×13): 3.375 g via INTRAVENOUS
  Filled 2016-05-28 (×13): qty 50

## 2016-05-28 MED ORDER — SODIUM CHLORIDE 0.9% FLUSH
10.0000 mL | Freq: Two times a day (BID) | INTRAVENOUS | Status: DC
  Administered 2016-05-28 – 2016-05-31 (×7): 10 mL

## 2016-05-28 MED ORDER — VANCOMYCIN HCL IN DEXTROSE 1-5 GM/200ML-% IV SOLN
1000.0000 mg | Freq: Every day | INTRAVENOUS | Status: DC
  Administered 2016-05-28 – 2016-05-30 (×3): 1000 mg via INTRAVENOUS
  Filled 2016-05-28 (×3): qty 200

## 2016-05-28 MED ORDER — SODIUM CHLORIDE 0.9 % IV SOLN
INTRAVENOUS | Status: DC
  Administered 2016-05-28: 22:00:00 via INTRAVENOUS

## 2016-05-28 MED ORDER — BOOST / RESOURCE BREEZE PO LIQD: 1.0000 | Freq: Four times a day (QID) | ORAL | Status: DC

## 2016-05-28 MED ORDER — PHENYLEPHRINE HCL 10 MG/ML IJ SOLN
30.0000 ug/min | INTRAVENOUS | Status: DC
  Administered 2016-05-28: 50 ug/min via INTRAVENOUS
  Administered 2016-05-28: 40 ug/min via INTRAVENOUS
  Filled 2016-05-28 (×2): qty 1

## 2016-05-28 MED ORDER — ALBUMIN HUMAN 5 % IV SOLN
12.5000 g | Freq: Once | INTRAVENOUS | Status: DC
  Filled 2016-05-28: qty 250

## 2016-05-28 MED ORDER — SODIUM CHLORIDE 0.9% FLUSH
10.0000 mL | INTRAVENOUS | Status: DC | PRN
Start: 2016-05-28 — End: 2016-06-02

## 2016-05-28 NOTE — Progress Notes (Signed)
Pharmacy Antibiotic Note  [name] is a 70 y.o. female admitted on 06/06/2016 with sepsis.  Pharmacy has been consulted for zosyn/vancomycin dosing.  Plan: Zosyn 3.375 Gm IV q8h EI Vancomycin 1Gm IV q24h  (VT=15-20 mg/L)     Temp (24hrs), Avg:97.1 F (36.2 C), Min:97.1 F (36.2 C), Max:97.1 F (36.2 C)   Recent Labs Lab 05/30/2016 0613 05/30/2016 0624  WBC 8.2  --   CREATININE 0.31*  --   LATICACIDVEN  --  3.11*    CrCl cannot be calculated (Unknown ideal weight.).    Allergies  Allergen Reactions  . Aleve [Naproxen Sodium] Other (See Comments)    Doesn't work..  . Codeine Nausea And Vomiting and Other (See Comments)    "head explode"   . Prednisone Swelling    Swelling of throat  . Tylenol [Acetaminophen]     Headaches per pt  . Ciprofloxacin Nausea And Vomiting  . Escitalopram Oxalate Nausea And Vomiting  . Gabapentin Nausea And Vomiting  . Hydrocodone Nausea And Vomiting  . Oxybutynin Chloride Nausea And Vomiting    Antimicrobials this admission: 11/1 zosyn >>  11/1 vancomycin >>   Dose adjustments this admission:   Microbiology results:  BCx:   UCx:    Sputum:    MRSA PCR:   Thank you for allowing pharmacy to be a part of this patient's care.  Dorrene German 06/19/2016 6:49 AM
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
## 2016-05-28 NOTE — Progress Notes (Signed)
PICC line placed by IV team. CCM MD called regarding results of chest x-ray placement verification. RN awaiting call back. Will collect blood lab and check CVP once placement of PICC line is verified. Will continue to monitor.

## 2016-05-28 NOTE — ED Notes (Signed)
Gave Pt lactic acid results to MD Bland Span

## 2016-05-28 NOTE — ED Notes (Signed)
Bed: WA04 Expected date:  Expected time:  Means of arrival:  Comments: EMS 70 yo female generalized weakness/feeling "sick"

## 2016-05-28 NOTE — Progress Notes (Signed)
CRITICAL VALUE ALERT  Critical value received:  Lactic acid 5.0  Date of notification:  06/10/2016  Time of notification:  2235  Critical value read back: yes  Nurse who received alert:  Matt Holmes  MD notified (1st page):  [place]  Time of first page:  2245  Time MD responded:  2245

## 2016-05-28 NOTE — Consult Note (Signed)
[facility] Nurse wound consult note Reason for Consult: multiple wounds with NPWT to the sacrum. Patient from home.  Wound type: Unstageable Pressure Injury lateral LLE; 14cm x 2cm x 0.1 Unstageable Pressure Injury lateral RLE: 5.5cm x 2.5cm x 0.1cm  Stage 4 Pressure Injury sacrum: 6.5cm x 11cm x 1.3cm with 0.5cm of undermining from 11-1 o'clock Stage 3 Pressure Injuries right and left scapula; right-0.5cm x 0.2cm x 0.1cm; left-1.0cm x 0.2cm x 0.2cm  Pressure Ulcer POA: Yes x 5 Measurement: see above Wound bed: LLE; 95% black eschar with 5% yellow at wound edges;slightly fluctuant  RLE: 50% eschar with 50% ruptured bulla at wound edges Sacrum: 95% red, some early granulation; 5% yellow slough Right and left scapular wounds: both 100% fibrinous yellow Drainage (amount, consistency, odor) minimal drainage, serosanguinous from the calf and scapular wounds. Sacrum unable to assess, canister was not attached to the wound from home.  Periwound: sacrum: intact;  Bilateral calves: intact; bilateral scapular wounds have about 2cm of circumferential non-blanchable redness.  Dressing procedure/placement/frequency: NPWT dressing change to the sacrum; she arrived with dressing intact but not attached to a NPWT device. 1pc of black foam used to fill the sacral wound, skin protected with drape to the right hip then 1pc of black foam used to bridge the TRAC pad away from the wound.  Seal obtained at 148mmHG.  Used hydrocolloid at the distal edge of the wound just above the rectum to aid in seal of dressing and protect from bowel incontinence. Silicone foam to the bilateral scapula to protect and insulate Silicone foam to the RLE wound to protect and insulate Add enzymatic debridement ointment to the LLE since fluctuant to clear the necrotic tissue, cover with moist gauze dressing. Change NPWT dressings M/W/F, foam dressings every 3 days, and the enzymatic debridement dressing daily. Low air loss mattress in place for  pressure redistribution.   [facility] team will follow along with you for complex NPWT VAC dressing. Notify WOC nurse of any acute changes with her pressure injuries.  Ariane Ditullio Liane Comber MSN, RN, [employer], CNS

## 2016-05-28 NOTE — Consult Note (Signed)
PULMONARY / CRITICAL CARE MEDICINE   Name: [name] MRN: [id number] DOB: Jun 01, 1946    ADMISSION DATE:  06/17/2016 CONSULTATION DATE:  11/1  REFERRING MD:  [place] Pink   CHIEF COMPLAINT:  Shock   HISTORY OF PRESENT ILLNESS:   Advancing weakness/functional quadriplegia in setting of prior Cervical myelopathy which did not improve w/surgery. She is bedbound at baseline, lives w/ sister. Has known h/o dysphagia and refused PEG, as well as prior admits for infected pressure ulcers. Was admitted on 11/1 w/ cc: "hurt all over", malaise, increased fatigue and encephalopathy. In ER found to be hypotensive w/ elevated lactic acid. She was admitted w/ working dx of sepsis. She was treated w/ 30 ml/kg IV fluid bolus, cultures were obtained, abx were started and she was admitted to the SDU status. In spite of this therapy she continued to have hypotension and lactic acid rose from 3.5 to 4.1 so PCCM was asked to assist w/ her care   PAST MEDICAL HISTORY :  She  has a past medical history of Acute blood loss anemia; Anxiety; Arthritis; Cataracts, bilateral; Chronic back pain; Chronic neck pain; Depression; Dysphagia; Fibromyalgia; GERD (gastroesophageal reflux disease); Hyperlipidemia; Hypertension; Hyponatremia; Hypotension due to drugs; Joint pain; Muscle spasm; Nocturia; Physical deconditioning; Pneumonia; Protein calorie malnutrition ([place]); Seasonal allergies; Somnolence; Spondylosis, cervical, with myelopathy; Urinary frequency; Urinary urgency; and Weakness.  PAST SURGICAL HISTORY: She  has a past surgical history that includes Abdominal hysterectomy (age 40); Colonoscopy (07-12-12); Hand surgery (Right); Anterior cervical decomp/discectomy fusion (N/A, 12/18/2015); and Posterior cervical fusion/foraminotomy (N/A, 12/18/2015).  Allergies  Allergen Reactions  . Aleve [Naproxen Sodium] Other (See Comments)    Doesn't work..  . Codeine Nausea And Vomiting and Other (See Comments)    "head  explode"   . Prednisone Swelling    Swelling of throat  . Tylenol [Acetaminophen]     Headaches per pt  . Ciprofloxacin Nausea And Vomiting  . Escitalopram Oxalate Nausea And Vomiting  . Gabapentin Nausea And Vomiting  . Hydrocodone Nausea And Vomiting  . Oxybutynin Chloride Nausea And Vomiting    No current facility-administered medications on file prior to encounter.    Current Outpatient Prescriptions on File Prior to Encounter  Medication Sig  . ALPRAZolam (XANAX) 0.25 MG tablet Take 1 tablet (0.25 mg total) by mouth 2 (two) times daily. (Patient taking differently: Take 0.25 mg by mouth at bedtime as needed for anxiety. )  . aspirin EC 81 MG tablet Take 1 tablet (81 mg total) by mouth daily.  . famotidine (PEPCID) 20 MG tablet Take 20 mg by mouth daily.  . magnesium oxide (MAG-OX) 400 MG tablet Take 400 mg by mouth daily.  Marland Kitchen omega-3 acid ethyl esters (LOVAZA) 1 g capsule Take 1 g by mouth every other day.   . ondansetron (ZOFRAN ODT) 4 MG disintegrating tablet Take 1 tablet (4 mg total) by mouth every 8 (eight) hours as needed for nausea or vomiting.  . feeding supplement (BOOST / RESOURCE BREEZE) LIQD Take 1 Container by mouth 4 (four) times daily. (Patient not taking: Reported on 06/21/2016)    FAMILY HISTORY:  Her indicated that her mother is deceased. She indicated that her father is deceased. She indicated that eight of her nine sisters are alive. She indicated that only one of her three brothers is alive. She indicated that the status of her neg hx is unknown. She indicated that the status of her other is unknown.    SOCIAL HISTORY: She  reports that she  has quit smoking. Her smoking use included Cigarettes. She has never used smokeless tobacco. She reports that she does not drink alcohol or use drugs.  REVIEW OF SYSTEMS:   Unable   SUBJECTIVE:  No distress  VITAL SIGNS: BP (!) 81/46   Pulse 67   Temp (!) 91.5 F (33.1 C) (Rectal)   Resp 13   Ht 5\' 9"  (1.753 m)    Wt 123 lb 3.8 oz (55.9 kg)   LMP 03/31/2004   SpO2 99%   BMI 18.20 kg/m   HEMODYNAMICS:    VENTILATOR SETTINGS:    INTAKE / OUTPUT: No intake/output data recorded.  PHYSICAL EXAMINATION: General:  Chronically ill appearing aaf. Lying in bed. Not in distress. Is confused.  Neuro:  Awake, contracted in all extremities. Oriented to self only  HEENT:  MMM, edentulous  Cardiovascular:  RRR, no MRG Lungs:  Crackles both bases. No accessory use, weak cough  Abdomen:  Soft, not tender + bowel sounds Musculoskeletal:  Contracted. hyper spastic  Skin:  Sacral wound vac in place, has dressings placed on both legs and back (see WON note)  LABS:  BMET  Recent Labs Lab 2016/06/09 0613 09-Jun-2016 1147  NA 129* 130*  K 2.9* 2.8*  CL 100* 104  CO2 19* 17*  BUN 8 6  CREATININE 0.31* 0.35*  GLUCOSE 80 89    Electrolytes  Recent Labs Lab 06/09/16 0613 06-09-16 1147  CALCIUM 6.9* 6.2*    CBC  Recent Labs Lab 06-09-2016 0613 06-09-2016 1147  WBC 8.2 10.2  HGB 9.3* 8.9*  HCT 26.7* 25.3*  PLT 404* 384    Coag's  Recent Labs Lab 09-Jun-2016 1147  APTT 38*    Sepsis Markers  Recent Labs Lab 06-09-16 0624 06-09-2016 0757 2016-06-09 1147  LATICACIDVEN 3.11* 3.51* 4.1*  PROCALCITON  --   --  54.22    ABG No results for input(s): PHART, PCO2ART, PO2ART in the last 168 hours.  Liver Enzymes  Recent Labs Lab 06/09/2016 0613 2016-06-09 1147  AST 158* 233*  ALT 60* 86*  ALKPHOS 131* 124  BILITOT 0.9 0.9  ALBUMIN 1.3* 1.2*    Cardiac Enzymes No results for input(s): TROPONINI, PROBNP in the last 168 hours.  Glucose No results for input(s): GLUCAP in the last 168 hours.  Imaging Dg Chest Portable 1 View  Result Date: 2016-06-09 CLINICAL DATA:  Weakness and worsening cough over the past few days. EXAM: PORTABLE CHEST 1 VIEW COMPARISON:  01/14/2016 FINDINGS: Nodular opacity in the left mid lung. Cannot exclude neoplasm. Probable pleural effusions bilaterally,  larger on the right. IMPRESSION: 1. Bilateral pleural effusions 2. Nodular opacity in the left mid lung, neoplasm versus infectious/inflammatory. 3. Radiographic follow-up or chest CT with contrast recommended, depending on clinical setting. Electronically Signed   By: Andreas Newport M.D.   On: 2016/06/09 05:59  bibasilar airspace disease. Bilateral effusions. Can't exclude infiltrate.   STUDIES:    CULTURES: 2023/06/10 UC>>> 06-10-23 BC>>>  ANTIBIOTICS: vanc 11/1>>> Zosyn 11/1>>>  SIGNIFICANT EVENTS:   LINES/TUBES:   DISCUSSION:  Septic shock. Primary source likely: aspiration vs UTI, vs chronic wounds w/ associated bacteremia. She remains hypotensive in spite of volume resuscitation. She is DNR and has refused invasive procedures in past. Plan at this point is: cont IV hydration, cont broad spec abx, will place her on neo gtt for now in effort to keep MAP >65, she has a PICC ordered and it seems reasonable to go with this instead of more invasive traditional central  line. In regards to her LUL nodular opacity we can consider CT of chest when more stable. This will also help evaluate her effusions better. Not clear if this opacity is infectious or possibly a malignancy which seems less likely.   ASSESSMENT / PLAN:  PULMONARY A: probable aspiration PNA Bilateral pleural effusions R>L LUL pulmonary nodule.  ->has h/o dysphagia.  No distress, on room air. LUL could me infectious or also consider malignancy  P:   Pulse ox No intubation/vent NPO given MS Consider CT chest to better define pulmonary pathology  May need to consider thoracentesis but CT would be helpful prior   CARDIOVASCULAR A:  Septic shock-->see ID section  Anasarca  ->cortisol nml P:  Cont IV hydration Neo for now-->has PICC ordered w/ plan for placement this afternoon so will hold off on central line Transduce CVP See ID section   RENAL A:   Lactic acidosis  AGMA Hypokalemia  Anasarca .  P:   Hold  antihypertensives and diuretics Cont IV hydration  Strict I&O   GASTROINTESTINAL A:   Severe protein calorie malnutrition  Dysphagia -->refused PEG on prior admit Shock liver   P:   NPO for now  HEMATOLOGIC A:   Anemia of chronic disease P:  Trend CBC Transfuse per ICU protocol  [place] LMWH  INFECTIOUS A:   SIRS/sepsis. Source not clear..  Multiple wounds would raise concern for transient bacteremia. H/o dysphagia would raise concern for aspiration PNA, urinary incont raises concern for urinary source.  P:   Cont vanc/zosyn Wound following-->they do not see evidence of acute wound infection F/u cultures Trend PCT algo Cont wound care as directed by wound team  ENDOCRINE A:   No acute  P:   Trend glucose   NEUROLOGIC A:   Acute encephalopathy Advancing weakness/functional quadriplegia in setting of prior Cervical myelopathy which did not improve w/surgery Bed bound P:   RASS goal: na Hold sedating meds   FAMILY  - Updates:   - Inter-disciplinary family meet or Palliative Care meeting due by:  Nov 8   Erick Colace ACNP-BC [place] Pager # [phone] OR # [phone] if no answer   06/19/2016, 2:00 PM

## 2016-05-28 NOTE — Evaluation (Signed)
SLP Cancellation Note  Patient Details Name: [name] MRN: [id number] DOB: 04-09-1946   Cancelled treatment:       Reason Eval/Treat Not Completed: Other (comment) (pt in process of being transferred another room and receiving care from RN, will continue efforts)   Claudie Fisherman, [place] [facility] SLP [phone]

## 2016-05-28 NOTE — ED Triage Notes (Signed)
Patient BIB [facility] from home. Patient c/o feeling weak, not eating well, but states she is drinking ok. Pt c/o nausea but no vomiting. Family cares for patient at home and reported, 'she has gone down hill in the last few days'. EMS reports pt had a sx back in May, pt is quadriplegic with a wound vac for wounds on back and bottom. Patient also has sores on back of both calves.
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
## 2016-05-28 NOTE — Progress Notes (Signed)
Notified Dr.Sendil [place] Pink of  critical lab results calcium 6.2 and Lactic 4.1

## 2016-05-29 DIAGNOSIS — J181 Lobar pneumonia, unspecified organism: Secondary | ICD-10-CM

## 2016-05-29 DIAGNOSIS — G8220 Paraplegia, unspecified: Secondary | ICD-10-CM

## 2016-05-29 DIAGNOSIS — E876 Hypokalemia: Secondary | ICD-10-CM

## 2016-05-29 DIAGNOSIS — R627 Adult failure to thrive: Secondary | ICD-10-CM

## 2016-05-29 DIAGNOSIS — J189 Pneumonia, unspecified organism: Secondary | ICD-10-CM

## 2016-05-29 DIAGNOSIS — E43 Unspecified severe protein-calorie malnutrition: Secondary | ICD-10-CM

## 2016-05-29 LAB — MAGNESIUM
MAGNESIUM: 2.2 mg/dL (ref 1.7–2.4)
Magnesium: 1.6 mg/dL — ABNORMAL LOW (ref 1.7–2.4)

## 2016-05-29 LAB — COMPREHENSIVE METABOLIC PANEL
ALBUMIN: 2 g/dL — AB (ref 3.5–5.0)
ALK PHOS: 136 U/L — AB (ref 38–126)
ALT: 216 U/L — ABNORMAL HIGH (ref 14–54)
ANION GAP: 7 (ref 5–15)
AST: 510 U/L — AB (ref 15–41)
BILIRUBIN TOTAL: 1.2 mg/dL (ref 0.3–1.2)
CO2: 14 mmol/L — AB (ref 22–32)
Calcium: 5.8 mg/dL — CL (ref 8.9–10.3)
Chloride: 111 mmol/L (ref 101–111)
Creatinine, Ser: <0.3 mg/dL — ABNORMAL LOW (ref 0.44–1.00)
GLUCOSE: 101 mg/dL — AB (ref 65–99)
POTASSIUM: 3.3 mmol/L — AB (ref 3.5–5.1)
SODIUM: 132 mmol/L — AB (ref 135–145)
TOTAL PROTEIN: 3.9 g/dL — AB (ref 6.5–8.1)

## 2016-05-29 LAB — BASIC METABOLIC PANEL
ANION GAP: 4 — AB (ref 5–15)
Anion gap: 7 (ref 5–15)
BUN: <5 mg/dL — ABNORMAL LOW (ref 6–20)
BUN: <5 mg/dL — ABNORMAL LOW (ref 6–20)
CHLORIDE: 107 mmol/L (ref 101–111)
CHLORIDE: 107 mmol/L (ref 101–111)
CO2: 16 mmol/L — AB (ref 22–32)
CO2: 19 mmol/L — AB (ref 22–32)
CREATININE: 0.32 mg/dL — AB (ref 0.44–1.00)
Calcium: 6.6 mg/dL — ABNORMAL LOW (ref 8.9–10.3)
Calcium: 6.6 mg/dL — ABNORMAL LOW (ref 8.9–10.3)
Creatinine, Ser: 0.31 mg/dL — ABNORMAL LOW (ref 0.44–1.00)
GFR calc non Af Amer: >60 mL/min (ref >60)
GFR calc non Af Amer: >60 mL/min (ref >60)
Glucose, Bld: 138 mg/dL — ABNORMAL HIGH (ref 65–99)
Glucose, Bld: 188 mg/dL — ABNORMAL HIGH (ref 65–99)
POTASSIUM: 3.4 mmol/L — AB (ref 3.5–5.1)
POTASSIUM: 4 mmol/L (ref 3.5–5.1)
SODIUM: 130 mmol/L — AB (ref 135–145)
SODIUM: 130 mmol/L — AB (ref 135–145)

## 2016-05-29 LAB — GLUCOSE, CAPILLARY: GLUCOSE-CAPILLARY: 177 mg/dL — AB (ref 65–99)

## 2016-05-29 LAB — LACTIC ACID, PLASMA
LACTIC ACID, VENOUS: 3.1 mmol/L — AB (ref 0.5–1.9)
LACTIC ACID, VENOUS: 3.1 mmol/L — AB (ref 0.5–1.9)

## 2016-05-29 LAB — URINE CULTURE: Culture: NO GROWTH

## 2016-05-29 LAB — CBC
HEMATOCRIT: 20.8 % — AB (ref 36.0–46.0)
HEMOGLOBIN: 7.4 g/dL — AB (ref 12.0–15.0)
MCH: 30.1 pg (ref 26.0–34.0)
MCHC: 35.6 g/dL (ref 30.0–36.0)
MCV: 84.6 fL (ref 78.0–100.0)
Platelets: 372 10*3/uL (ref 150–400)
RBC: 2.46 MIL/uL — ABNORMAL LOW (ref 3.87–5.11)
RDW: 16.8 % — AB (ref 11.5–15.5)
WBC: 11.8 10*3/uL — AB (ref 4.0–10.5)

## 2016-05-29 LAB — PHOSPHORUS
PHOSPHORUS: 1.8 mg/dL — AB (ref 2.5–4.6)
Phosphorus: 1.2 mg/dL — ABNORMAL LOW (ref 2.5–4.6)

## 2016-05-29 MED ORDER — SODIUM CHLORIDE 0.9 % IV SOLN
Freq: Once | INTRAVENOUS | Status: AC
  Administered 2016-05-29: 05:00:00 via INTRAVENOUS
  Filled 2016-05-29: qty 10

## 2016-05-29 MED ORDER — POTASSIUM & SODIUM PHOSPHATES 280-160-250 MG PO PACK
1.0000 | PACK | Freq: Three times a day (TID) | ORAL | Status: AC
  Administered 2016-05-29 – 2016-05-31 (×7): 1 via ORAL
  Filled 2016-05-29 (×8): qty 1

## 2016-05-29 MED ORDER — MAGNESIUM SULFATE 2 GM/50ML IV SOLN
2.0000 g | Freq: Once | INTRAVENOUS | Status: AC
  Administered 2016-05-29: 2 g via INTRAVENOUS
  Filled 2016-05-29: qty 50

## 2016-05-29 MED ORDER — POTASSIUM PHOSPHATES 15 MMOLE/5ML IV SOLN
20.0000 mmol | Freq: Once | INTRAVENOUS | Status: AC
  Administered 2016-05-29: 20 mmol via INTRAVENOUS
  Filled 2016-05-29: qty 6.67

## 2016-05-29 MED ORDER — KCL-LACTATED RINGERS-D5W 20 MEQ/L IV SOLN
INTRAVENOUS | Status: DC
  Administered 2016-05-29 – 2016-06-01 (×6): via INTRAVENOUS
  Filled 2016-05-29 (×9): qty 1000

## 2016-05-29 MED ORDER — ONDANSETRON HCL 4 MG/2ML IJ SOLN
4.0000 mg | Freq: Four times a day (QID) | INTRAMUSCULAR | Status: DC | PRN
Start: 2016-05-29 — End: 2016-06-02
  Administered 2016-05-29: 4 mg via INTRAVENOUS
  Filled 2016-05-29: qty 2

## 2016-05-29 MED ORDER — IBUPROFEN 200 MG PO TABS
400.0000 mg | ORAL_TABLET | Freq: Four times a day (QID) | ORAL | Status: DC | PRN
  Administered 2016-05-29 – 2016-05-30 (×2): 400 mg via ORAL
  Filled 2016-05-29 (×2): qty 2

## 2016-05-29 NOTE — Progress Notes (Signed)
PULMONARY / CRITICAL CARE MEDICINE   Name: [name] MRN: [id number] DOB: [birth year]    ADMISSION DATE:  06/07/2016 CONSULTATION DATE:  11/1  REFERRING MD:  [place] Pink   CHIEF COMPLAINT:  Shock   Brief: 70 y/o female with cervical myelopathy who presented on 11/1 with shock and sepsis in the setting of multiple skin wounds and likely aspiration pneumonia leading to large bilateral pleural effusions.   SUBJECTIVE:  Comfortable in bed, no pain, says she feels better  VITAL SIGNS: BP (!) 143/66   Pulse 95   Temp 97.9 F (36.6 C)   Resp 20   Ht 5\' 9"  (1.753 m)   Wt 123 lb 3.8 oz (55.9 kg)   LMP 03/31/2004   SpO2 96%   BMI 18.20 kg/m   HEMODYNAMICS: CVP:  [7 mmHg-10 mmHg] 7 mmHg  VENTILATOR SETTINGS:    INTAKE / OUTPUT: I/O last 3 completed shifts: In: 7494.6 [I.V.:3144.6; IV Piggyback:4350] Out: 660 [Urine:660]  PHYSICAL EXAMINATION: General:  Chronically ill appearing, in bed HENT: NCAT OP clear PULM: few crackles R, diminished R base CV: RRR, no mgr, no JVD GI: BS+, soft, nontender Derm: multiple skin wounds, leg wounds dressed, sacral wound with vac, edema legs Neuro: awake, alert, conversant, no movement extremities  LABS:  BMET  Recent Labs Lab 05/29/2016 0613 06/10/2016 1147 05/29/16 0357  NA 129* 130* 132*  K 2.9* 2.8* 3.3*  CL 100* 104 111  CO2 19* 17* 14*  BUN 8 6 <5*  CREATININE 0.31* 0.35* <0.30*  GLUCOSE 80 89 101*    Electrolytes  Recent Labs Lab 06/04/2016 0613 06/04/2016 1147 05/29/16 0357  CALCIUM 6.9* 6.2* 5.8*  MG  --   --  1.6*  PHOS  --   --  1.2*    CBC  Recent Labs Lab 06/22/2016 0613 06/22/2016 1147 05/29/16 0357  WBC 8.2 10.2 11.8*  HGB 9.3* 8.9* 7.4*  HCT 26.7* 25.3* 20.8*  PLT 404* 384 372    Coag's  Recent Labs Lab 06/20/2016 1147  APTT 38*    Sepsis Markers  Recent Labs Lab 06/06/2016 0757 05/30/2016 1147 06/15/2016 2139  LATICACIDVEN 3.51* 4.1* 5.0*  PROCALCITON  --  54.22  --     ABG No  results for input(s): PHART, PCO2ART, PO2ART in the last 168 hours.  Liver Enzymes  Recent Labs Lab 06/26/2016 0613 06/24/2016 1147 05/29/16 0357  AST 158* 233* 510*  ALT 60* 86* 216*  ALKPHOS 131* 124 136*  BILITOT 0.9 0.9 1.2  ALBUMIN 1.3* 1.2* 2.0*    Cardiac Enzymes No results for input(s): TROPONINI, PROBNP in the last 168 hours.  Glucose No results for input(s): GLUCAP in the last 168 hours.  Imaging Ct Chest Wo Contrast  Result Date: 05/29/2016 CLINICAL DATA:  70 y/o F; weakness, fatigue, and community-acquired pneumonia with sepsis. Evaluate for lung nodules. EXAM: CT CHEST WITHOUT CONTRAST TECHNIQUE: Multidetector CT imaging of the chest was performed following the standard protocol without IV contrast. COMPARISON:  06/25/2016 chest radiograph. FINDINGS: Cardiovascular: Normal heart size. Small pericardial fluid is probably physiologic. Normal caliber thoracic aorta and main pulmonary artery. Decreased attenuation of cardiac blood pool consistent with anemia. Aortic atherosclerosis with mild calcifications. Bovine arch, normal variant. Mediastinum/Nodes: Left-sided PICC line with tip in the lower SVC. Normal thyroid gland. No mediastinal lymphadenopathy. Central airways are patent. Esophagus is unremarkable. Lungs/Pleura: Large right and small to moderate left pleural effusions. Diffuse peribronchial thickening and mucous plugging predominantly within the left lower lobe. Consolidation within the  left upper lobe along the major fissure. Additional scattered clusters of nodules along airways throughout left lung and in the right middle lobe. Upper Abdomen: Diffuse hypoattenuation of the liver likely represent steatosis. Musculoskeletal: No acute osseous abnormality. Edema within subcutaneous tissues is compatible with third-spacing. IMPRESSION: 1. Left upper lobe consolidation consistent with pneumonia and diffuse bronchitis greatest in the left lower lobe. Additional scattered areas of  bronchiolitis predominantly in the left lung. 2. Large right and small to moderate left pleural effusions. Lobular contour to the effusions suggests adhesions. Consider chest ultrasound to evaluate for complex exudate. 3. Probable hepatic steatosis. Electronically Signed   By: Kristine Garbe M.D.   On: 05/29/2016 00:28   Dg Chest Port 1 View  Result Date: 06/21/2016 CLINICAL DATA:  PICC placement EXAM: PORTABLE CHEST 1 VIEW COMPARISON:  Chest radiograph from earlier today. FINDINGS: Left PICC terminates over the right atrium approximately 3 cm below the cavoatrial junction. Stable cardiomediastinal silhouette with normal heart size and aortic atherosclerosis. No pneumothorax. Stable small bilateral pleural effusions. Stable asymmetric left apical pleural thickening. Stable focal 3 cm patchy opacity in the left mid lung. Stable patchy opacity at both lung bases. IMPRESSION: 1. Left PICC terminates over the right atrium approximately 3 cm below the cavoatrial junction . 2. Stable nonspecific focal patchy opacity in the left mid lung. Stable patchy bibasilar lung opacities. Findings could represent multilobar pneumonia, with underlying mass not excluded. Follow-up chest imaging to resolution. 3. Stable small bilateral pleural effusions. 4. Aortic atherosclerosis. Electronically Signed   By: Ilona Sorrel M.D.   On: 06/04/2016 20:35  bibasilar airspace disease. Bilateral effusions. Can't exclude infiltrate.   STUDIES:  11/1 CT chest > consolidation bilateral lower lobes with R>L large loculated pleural effusion  CULTURES: 11/1 UC>>> 11/1 BC>>>  ANTIBIOTICS: vanc 11/1>>> Zosyn 11/1>>>  SIGNIFICANT EVENTS:   LINES/TUBES:   DISCUSSION:  Septic shock in the setting of quadriplegia, malnutrition.  The source appears to be aspiration pneumonia vs wound related infections.  She is requiring minimal vasopressor support this morning. I worry the large R pleural effusion is infected.     ASSESSMENT / PLAN:  PULMONARY A: Aspiration PNA Bilateral pleural effusions R>L Dysphagia  P:   DNI status Advance diet post SLP eval Encouraged patient to have a diagnostic and therapeutic thoracentesis today, she refused   CARDIOVASCULAR A:  Septic shock> improving Anasarca  P:  Cont IV fluids with LR Continue neosynephrine Transduce CVP  RENAL A:   Lactic acidosis  Hypokalemia  Hypomagnesemia Hypophosphatemia P:   Repeat lactic acid now Replace phos with IV and oral phos-nak Replace mg IV Repeat BMET, Mg, Phos   GASTROINTESTINAL A:   Severe protein calorie malnutrition  Dysphagia -->refused PEG on prior admit Shock liver   P:   SLP evaluation Advance diet  HEMATOLOGIC A:   Anemia of chronic disease P:  Trend CBC Transfuse per ICU protocol  [place] LMWH  INFECTIOUS A:   Aspiration pneumonia with large parapneumonic effusion Multiple wounds present on admission P:   Cont vanc/zosyn Wound following> continue wound care Would like to perform thoracentesis  ENDOCRINE A:   No acute  P:   Trend glucose   NEUROLOGIC A:   Advancing weakness/functional quadriplegia in setting of prior Cervical myelopathy which did not improve w/surgery Bed bound P:   Hold sedating meds   FAMILY  - Updates: called sister Duree for update this morning. I expressed my concern that her sister is refusing procedures but explained that  we need to protect the patient's autonomy.  Duree agreed completely.  We will discuss further with Duree present this morning, but if the patient continues to refuse invasive procedures that we recommend then we will ask for palliative involvement.  Hazyl's refusal to have procedures is understandable considering her overall poor health and a palliative approach may be best.  - Inter-disciplinary family meet or Palliative Care meeting due by:  Nov 8  My cc time 35 minutes  Roselie Awkward, MD [place] P[facility]M Pager: [phone] Cell:  [phone] After 3pm or if no response, call [phone]  05/29/2016, 8:28 AM

## 2016-05-29 NOTE — Progress Notes (Signed)
[facility] Progress Note Patient Name: [name] DOB: [birth year] MRN: [id number]   Date of Service  05/29/2016  HPI/Events of Note  Multiple electrolyte abnormalities and hyperchloremic acidosis  eICU Interventions  Change IVFs to LR + KCl Replete Ca++ and Mag     Intervention Category Major Interventions: Electrolyte abnormality - evaluation and management  Wilhelmina Mcardle 05/29/2016, 4:58 AM

## 2016-05-29 NOTE — Care Management Note (Signed)
Case Management Note  Patient Details  Name: DALAINA NOYER MRN: [id number] Date of Birth: Feb 17, 1946  Subjective/Objective:       Sepsis and hypotension             Action/Plan:Date:  May 29, 2016 Chart reviewed for concurrent status and case management needs. Will continue to follow the patient for status change: Discharge Planning: following for needs Expected discharge date: [id number] Velva Harman, BSN, [place], [place]   Expected Discharge Date:   ([place])               Expected Discharge Plan:  Home w Hospice Care  In-House Referral:     Discharge planning Services     Post Acute Care Choice:    Choice offered to:     DME Arranged:    DME Agency:     HH Arranged:    [place] Agency:     Status of Service:  In process, will continue to follow  If discussed at Long Length of Stay Meetings, dates discussed:    Additional Comments:  Leeroy Cha, RN 05/29/2016, 10:57 AM

## 2016-05-29 NOTE — Clinical Social Work Note (Addendum)
Clinical Social Work Assessment  Patient Details  Name: [name] MRN: [id number] Date of Birth: 03-Mar-1946  Date of referral:  05/29/16               Reason for consult:  Discharge Planning                Permission sought to share information with:    Permission granted to share information::     Name::        Agency::     Relationship::     Contact Information:     Housing/Transportation Living arrangements for the past 2 months:  Single Family Home Source of Information:  Other (Comment Required) (sister) Patient Interpreter Needed:  None Criminal Activity/Legal Involvement Pertinent to Current Situation/Hospitalization:  No - Comment as needed Significant Relationships:  Siblings Lives with:  Siblings Do you feel safe going back to the place where you live?  Yes Need for family participation in patient care:  Yes (Comment)  Care giving concerns:  Sister reports that pt needs more care than she is able to provide. " My sister won't listen to me and she won't do what she needs to do. If I can't get more help at home my sister will need nursing home placement. "   Social Worker assessment / plan:  Pt hospitalized on 05/29/2016 from home with Aspiration PNA. CSW consulted to assist with SNF placement. CSW was unable to meet with pt due to care being provided. CSW met with pt's sister Vaughan Basta [phone] / [phone] this afternoon. Sister reports that pt needs SNF placement at d/c but she hasn't discussed this with pt yet. Pt has extensive wounds requiring wound vac. CSW has offered to meet with pt / sister together to discuss placement for wound care. Pt is paraplegic and bed bound. Sister reports that she is also looking at getting more help at home. CSW will meet with pt / sister on 11/3 to continue assisting with d/c planning needs. Pt is alert, oriented x3 , and may be able to participate in d/c planning. Cognitive status appears to fluctuate.  Employment  status:  Retired Forensic scientist:  Medicare PT Recommendations:    Information / Referral to community resources:  [place]  Patient/Family's Response to care:  Disposition to be determined.  Patient/Family's Understanding of and Emotional Response to Diagnosis, Current Treatment, and Prognosis:  Pt / sister are aware of pt's medical status. Pt has chosen DNR status this admission.   Emotional Assessment Appearance:  Appears stated age Attitude/Demeanor/Rapport:  Other (cooperative) Affect (typically observed):  Other, Blunt Orientation:  Oriented to Self, Oriented to Place, Oriented to Situation Alcohol / Substance use:  Not Applicable Psych involvement (Current and /or in the community):  No (Comment)  Discharge Needs  Concerns to be addressed:  Discharge Planning Concerns Readmission within the last 30 days:  No Current discharge risk:    Barriers to Discharge:  No Barriers Identified   Luretha Rued, [place] 05/29/2016, 2:01 PM
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
## 2016-05-29 NOTE — Progress Notes (Signed)
CRITICAL VALUE ALERT  Critical value received:  Calcium 5.8  Date of notification:  05/29/2016  Time of notification:  [id number]  Critical value read back: yes  Nurse who received alert:  Wayna Chalet, RN  MD notified (1st page): E-link  Time of first page:  [phone]   Responding MD:  elink  Time MD responded:  [phone]

## 2016-05-29 NOTE — Progress Notes (Signed)
Called E-link to confirm use of PICC after x-ray. E-link MD confirmed PICC can be used.  Waynard Reeds, RN

## 2016-05-29 NOTE — Progress Notes (Signed)
Patient presented on my list under [facility] Patient currently on vasopressors TRH will assume care when stable for transfer per PCCM Thank you

## 2016-05-29 NOTE — Progress Notes (Signed)
Initial Nutrition Assessment  DOCUMENTATION CODES:   Underweight  INTERVENTION:  - Diet advancement as medically feasible. - RD will provide nutrition-related interventions based on diet advancement.   NUTRITION DIAGNOSIS:   Inadequate oral intake related to inability to eat as evidenced by NPO status.  GOAL:   Patient will meet greater than or equal to 90% of their needs  MONITOR:   Diet advancement, Weight trends, Labs, Skin  REASON FOR ASSESSMENT:   Consult Wound healing  ASSESSMENT:   70 y.o. female with medical history significant of for paraplegia, severe protein calorie malnutrition and hypertension who resides at home in the care of her sister. Patient is not a good historian, but reportedly she started feeling worse. She describes feeling worse as hurting all over and feeling very tired. Patient's sister was able to clarify that this started about 2 days ago. When she came into the emergency room, it was noted to have signs consistent with a pneumonia based on chest x-ray. Her white count, temperature and other labs were stable, however lactic acid level was markedly elevated at 3.2. Patient was started on IV antibiotics and aggressive IV fluid resuscitation her initial blood pressure was borderline low at 100s, however repeat blood pressures dropped lower into the 90s.   Pt seen for consult. BMI indicates underweight status. Pt has been NPO and unable to meet estimated nutrition needs. Per notes, SLP to evaluate pt for recommendations for diet. Also per notes, pt has refused PEG in the past (indication: dysphagia). Pt with no teeth present and she reports that she sometimes wears them when eating and sometimes does not wear them with eating. She states upper denture fits very well but lower denture is ill-fitting. Unable to discuss potential swallowing issues at this time.   She states that for "several weeks" PTA she had a decreased appetite and that she did not eat  anything the day before admission d/t poor appetite. She states she continues to not feel hungry at this time. She denies nausea this AM but states she has feeling of tightness/fullness to abdomen.   Physical assessment shows moderate edema to extremities which is likely masking muscle and fat depletion; no depletion noted but likely to be present. Per chart review, pt has gained 16 lbs in the past 2 months; likely fluid-related. Unable to state malnutrition at this time but highly likely that pt continues to meet criteria for severe malnutrition.   Medications reviewed; 12.5 g IV albumin TID, 1 g Ca gluconate with 1 g Mg sulfate x1 dose today, 20 mg IV Pepcid once/day, 2 g IV Mg sulfate x1 dose today, 17 g Miralax/day, 1 packet Phos-Nak QID, 10 mEq IV KCl x8 runs yesterday, 20 mmol KPhos x1 dose today.  Labs reviewed; Na: 132 mmol/L, K: 3.3 mmol/L, BUN: <5 mg/dL, creatinine: <0.3 mg/dL, Ca: 5.8 mg/dL, Phos: 1.2 mg/dL, Mg: 1.6 mg/dL, LFTs elevated and trending up, lactic acid: 5 mmol/L and trending up.   IVF: D5-LR-20 mEq KCl @ 75 mL/hr (306 kcal).    Diet Order:   NPO  Skin:   Stage 4 sacral with wound vac, Stage 3 bilateral shoulders, Unstageable/full-thickness bilateral legs pressure injuries.  Last BM:  11/2  Height:   Ht Readings from Last 1 Encounters:  06/04/2016 5\' 9"  (1.753 m)    Weight:   Wt Readings from Last 1 Encounters:  05/31/2016 123 lb 3.8 oz (55.9 kg)    Ideal Body Weight:  62.61 kg  BMI:  Body mass  index is 18.2 kg/m.  Estimated Nutritional Needs:   Kcal:  [id number] (35-40 kcal/kg)  Protein:  112-123 grams (2-2.2 grams/kg)  Fluid:  >/= 1.5 L/day  EDUCATION NEEDS:   No education needs identified at this time    Jarome Matin, MS, RD, LDN Inpatient Clinical Dietitian Pager # [phone] After hours/weekend pager # [phone]

## 2016-05-30 ENCOUNTER — Inpatient Hospital Stay (HOSPITAL_COMMUNITY): Payer: Medicare Other

## 2016-05-30 DIAGNOSIS — Z7189 Other specified counseling: Secondary | ICD-10-CM

## 2016-05-30 DIAGNOSIS — J9601 Acute respiratory failure with hypoxia: Secondary | ICD-10-CM

## 2016-05-30 DIAGNOSIS — D649 Anemia, unspecified: Secondary | ICD-10-CM

## 2016-05-30 DIAGNOSIS — L89154 Pressure ulcer of sacral region, stage 4: Secondary | ICD-10-CM

## 2016-05-30 DIAGNOSIS — J90 Pleural effusion, not elsewhere classified: Secondary | ICD-10-CM

## 2016-05-30 DIAGNOSIS — Z515 Encounter for palliative care: Secondary | ICD-10-CM

## 2016-05-30 LAB — BODY FLUID CELL COUNT WITH DIFFERENTIAL
Lymphs, Fluid: 37 %
Monocyte-Macrophage-Serous Fluid: 52 % (ref 50–90)
Neutrophil Count, Fluid: 11 % (ref 0–25)
WBC FLUID: 69 uL (ref 0–1000)

## 2016-05-30 LAB — LACTATE DEHYDROGENASE: LDH: 505 U/L — ABNORMAL HIGH (ref 98–192)

## 2016-05-30 LAB — CBC WITH DIFFERENTIAL/PLATELET
Basophils Absolute: 0 10*3/uL (ref 0.0–0.1)
Basophils Relative: 0 %
EOS ABS: 0 10*3/uL (ref 0.0–0.7)
EOS PCT: 0 %
HCT: 18.1 % — ABNORMAL LOW (ref 36.0–46.0)
HEMOGLOBIN: 6.4 g/dL — AB (ref 12.0–15.0)
LYMPHS ABS: 0.5 10*3/uL — AB (ref 0.7–4.0)
Lymphocytes Relative: 7 %
MCH: 29.8 pg (ref 26.0–34.0)
MCHC: 35.4 g/dL (ref 30.0–36.0)
MCV: 84.2 fL (ref 78.0–100.0)
MONOS PCT: 3 %
Monocytes Absolute: 0.2 10*3/uL (ref 0.1–1.0)
Neutro Abs: 6.1 10*3/uL (ref 1.7–7.7)
Neutrophils Relative %: 89 %
PLATELETS: 267 10*3/uL (ref 150–400)
RBC: 2.15 MIL/uL — ABNORMAL LOW (ref 3.87–5.11)
RDW: 17.4 % — ABNORMAL HIGH (ref 11.5–15.5)
WBC: 6.8 10*3/uL (ref 4.0–10.5)

## 2016-05-30 LAB — BASIC METABOLIC PANEL
Anion gap: 4 — ABNORMAL LOW (ref 5–15)
CHLORIDE: 107 mmol/L (ref 101–111)
CO2: 19 mmol/L — AB (ref 22–32)
CREATININE: 0.37 mg/dL — AB (ref 0.44–1.00)
Calcium: 6.6 mg/dL — ABNORMAL LOW (ref 8.9–10.3)
GFR calc Af Amer: >60 mL/min (ref >60)
GFR calc non Af Amer: >60 mL/min (ref >60)
GLUCOSE: 194 mg/dL — AB (ref 65–99)
Potassium: 4.1 mmol/L (ref 3.5–5.1)
Sodium: 130 mmol/L — ABNORMAL LOW (ref 135–145)

## 2016-05-30 LAB — PROTEIN, BODY FLUID: Total protein, fluid: <3 g/dL

## 2016-05-30 LAB — HEPATIC FUNCTION PANEL
ALBUMIN: 1.8 g/dL — AB (ref 3.5–5.0)
ALK PHOS: 295 U/L — AB (ref 38–126)
ALT: 319 U/L — ABNORMAL HIGH (ref 14–54)
AST: 441 U/L — AB (ref 15–41)
Bilirubin, Direct: 0.5 mg/dL (ref 0.1–0.5)
Indirect Bilirubin: 0.7 mg/dL (ref 0.3–0.9)
TOTAL PROTEIN: 3.7 g/dL — AB (ref 6.5–8.1)
Total Bilirubin: 1.2 mg/dL (ref 0.3–1.2)

## 2016-05-30 LAB — PHOSPHORUS: Phosphorus: 1.6 mg/dL — ABNORMAL LOW (ref 2.5–4.6)

## 2016-05-30 LAB — LACTATE DEHYDROGENASE, PLEURAL OR PERITONEAL FLUID: LD FL: 53 U/L — AB (ref 3–23)

## 2016-05-30 LAB — URINE CULTURE: Culture: NO GROWTH

## 2016-05-30 LAB — MAGNESIUM: Magnesium: 2.2 mg/dL (ref 1.7–2.4)

## 2016-05-30 LAB — PREPARE RBC (CROSSMATCH)

## 2016-05-30 LAB — PROTEIN, TOTAL: Total Protein: 3.8 g/dL — ABNORMAL LOW (ref 6.5–8.1)

## 2016-05-30 MED ORDER — SODIUM CHLORIDE 0.9 % IV BOLUS (SEPSIS)
500.0000 mL | Freq: Once | INTRAVENOUS | Status: AC
  Administered 2016-05-30: 500 mL via INTRAVENOUS

## 2016-05-30 MED ORDER — ENSURE ENLIVE PO LIQD: 237.0000 mL | ORAL | Status: DC

## 2016-05-30 MED ORDER — BOOST / RESOURCE BREEZE PO LIQD: 1.0000 | ORAL | Status: DC

## 2016-05-30 MED ORDER — MAGNESIUM SULFATE 2 GM/50ML IV SOLN
2.0000 g | Freq: Once | INTRAVENOUS | Status: AC
  Administered 2016-05-30: 2 g via INTRAVENOUS
  Filled 2016-05-30: qty 50

## 2016-05-30 MED ORDER — SODIUM CHLORIDE 0.9 % IV SOLN
Freq: Once | INTRAVENOUS | Status: AC
  Administered 2016-05-30: 07:00:00 via INTRAVENOUS

## 2016-05-30 NOTE — Progress Notes (Signed)
SLP Cancellation Note  Patient Details Name: [name] MRN: [id number] DOB: 05/03/1946   Cancelled treatment:       Reason Eval/Treat Not Completed: Patient at procedure or test/unavailable. Will f/u as time allows.   Blair Heys K 05/30/2016, 9:48 AM

## 2016-05-30 NOTE — Progress Notes (Signed)
PULMONARY / CRITICAL CARE MEDICINE   Name: [name] MRN: [id number] DOB: November 24, 1945    ADMISSION DATE:  06/05/2016 CONSULTATION DATE:  11/1  REFERRING MD:  [place] Pink   CHIEF COMPLAINT:  Shock   Brief: 70 y/o female with cervical myelopathy who presented on 11/1 with shock and sepsis in the setting of multiple skin wounds and likely aspiration pneumonia leading to large bilateral pleural effusions.   SUBJECTIVE:  Confusion overnight Hgb down> receiving 1 U PRBC Off pressors Had SLP eval, refused puree diet  VITAL SIGNS: BP 130/64   Pulse 86   Temp 97.2 F (36.2 C)   Resp 19   Ht 5\' 9"  (1.753 m)   Wt 123 lb 3.8 oz (55.9 kg)   LMP 03/31/2004   SpO2 94%   BMI 18.20 kg/m   HEMODYNAMICS: CVP:  [6 mmHg-19 mmHg] 15 mmHg  VENTILATOR SETTINGS:    INTAKE / OUTPUT: I/O last 3 completed shifts: In: 5695.1 [P.O.:640; I.V.:3088.4; Other:10; IV Piggyback:1956.7] Out: 565 [Urine:565]  PHYSICAL EXAMINATION: General:  Chronically ill appearing, in bed HENT: NCAT OP clear PULM: few crackles R, diminished R base CV: RRR, no mgr, no JVD GI: BS+, soft, nontender Derm: multiple skin wounds, leg wounds dressed, sacral wound with vac, edema legs Neuro: awake, alert, conversant, no movement extremities  LABS:  BMET  Recent Labs Lab 05/29/16 0955 05/29/16 2047 05/30/16 0505  NA 130* 130* 130*  K 3.4* 4.0 4.1  CL 107 107 107  CO2 16* 19* 19*  BUN <5* <5* <5*  CREATININE 0.32* 0.31* 0.37*  GLUCOSE 138* 188* 194*    Electrolytes  Recent Labs Lab 05/29/16 0357 05/29/16 0955 05/29/16 2047 05/30/16 0505  CALCIUM 5.8* 6.6* 6.6* 6.6*  MG 1.6*  --  2.2 2.2  PHOS 1.2*  --  1.8* 1.6*    CBC  Recent Labs Lab 05/31/2016 1147 05/29/16 0357 05/30/16 0505  WBC 10.2 11.8* 6.8  HGB 8.9* 7.4* 6.4*  HCT 25.3* 20.8* 18.1*  PLT 384 372 267    Coag's  Recent Labs Lab 06/13/2016 1147  APTT 38*    Sepsis Markers  Recent Labs Lab 06/21/2016 1147  06/12/2016 2139 05/29/16 0956 05/29/16 1656  LATICACIDVEN 4.1* 5.0* 3.1* 3.1*  PROCALCITON 54.22  --   --   --     ABG No results for input(s): PHART, PCO2ART, PO2ART in the last 168 hours.  Liver Enzymes  Recent Labs Lab 05/29/2016 0613 06/24/2016 1147 05/29/16 0357  AST 158* 233* 510*  ALT 60* 86* 216*  ALKPHOS 131* 124 136*  BILITOT 0.9 0.9 1.2  ALBUMIN 1.3* 1.2* 2.0*    Cardiac Enzymes No results for input(s): TROPONINI, PROBNP in the last 168 hours.  Glucose  Recent Labs Lab 05/29/16 1611  GLUCAP 177*    Imaging 11/1 CXR with bibasilar airspace disease. Bilateral effusions. Can't exclude infiltrate.   STUDIES:  11/1 CT chest > consolidation bilateral lower lobes with R>L large loculated pleural effusion  CULTURES: 11/1 UC>>> 11/1 BC>>>  ANTIBIOTICS: vanc 11/1>>> 11/3 Zosyn 11/1>>>  SIGNIFICANT EVENTS:   LINES/TUBES:   DISCUSSION:  Septic shock in the setting of quadriplegia, malnutrition.  The source appears to be aspiration pneumonia vs wound related infections.  Now off of vasopressors.  Has symptomatic anemia without bleeding. I worry the large R pleural effusion is infected.  After initially refusing, she now consents for thoracentesis however she has refused most other recommendations (SLP diet recommendations, hospitalizations, advanced life support) recently.  Palliative medicine consultation  will be helpful to help [place] establish goals of care.  ASSESSMENT / PLAN:  PULMONARY A: Aspiration PNA Bilateral pleural effusions R>L (empyema?) Dysphagia  P:   DNI status Advance diet post SLP eval Thoracentesis now, even if this is an empyema she is not a VATS candidate; will send for routine studies   CARDIOVASCULAR A:  Septic shock> resolved P:  Cont IV fluids with LR Monitor BP  RENAL A:   Lactic acidosis > improving Hypokalemia > resolved Hypomagnesemia  Hypophosphatemia Anasarca  P:   Continue phos replacement > oral Replace mg  IV, additional 2gm this morning Repeat BMET, Mg, Phos   GASTROINTESTINAL A:   Severe protein calorie malnutrition  Dysphagia -->refused PEG on prior admit Shock liver   P:   Liquid diet per SLP recommendations  HEMATOLOGIC A:   Anemia of chronic disease without bleeding P:  Trend CBC Transfuse per ICU protocol (1 U PRBC 11/3 AM) [place] [facility] > hold given anemia  INFECTIOUS A:   Aspiration pneumonia with large parapneumonic effusion Multiple wounds present on admission P:   Cont zosyn, d/c vanc Wound following> continue wound care Thoracentesis today  ENDOCRINE A:   No acute  P:   Trend glucose   NEUROLOGIC A:   Advancing weakness/functional quadriplegia in setting of prior Cervical myelopathy which did not improve w/surgery Bed bound P:   Hold sedating meds   FAMILY  - Updates: called sister Duree for update 11/2, see note.   - Inter-disciplinary family meet or Palliative Care meeting due by:  Nov 8  Will transfer to [facility] service, SDU status   Roselie Awkward, MD [place] [facility] Pager: [phone] Cell: [phone] After 3pm or if no response, call [phone]  05/30/2016, 9:29 AM

## 2016-05-30 NOTE — Progress Notes (Signed)
CSW assisting with d/c planning. CSW met with pt's sister this afternoon. Pt sleeping soundly. Sister reports that she spoke with pt and pt is in agreement with plan for SNF at d/c. SNF search initiated and bed offers pending. CSW will continue to follow to assist with d/c planning needs.  Werner Lean LCSW [phone]

## 2016-05-30 NOTE — Progress Notes (Addendum)
Patient ID: [name], female   DOB: [birth year], 70 y.o.   MRN: [id number]  PROGRESS NOTE    [name]  SWN:[id number] DOB: 1945/09/13 DOA: 06/10/2016  PCP: Laurey Morale, MD   Brief Narrative:  70 year old female with cervical myelopathy and quadriplegia, severe protein calorie malnutrition, hypertension. She presented to hospital 06/25/2016 with generalized pain, fatigue. Patient was found to have septic shock secondary to pneumonia and multiple skin wounds. She also was found to have large bilateral pleural effusions. Patient was under critical care medicine through today 05/30/2016.   Assessment & Plan:   Principal Problem:   Sepsis secondary to aspiration pneumonia ([place]) / leukocytosis - Sepsis criteria met on the admission. Patient required brief course of pressor support yesterday but currently is off of pressors - Blood pressure 130/64 - She was on vancomycin and Zosyn but currently on Zosyn only - Blood cultures so far show no growth to date - Urine culture shows no growth to date - We'll continue to monitor in step down unit for next 24 hours until goals of care discussion in place  Active Problems:   Acute respiratory failure with hypoxia / aspiration pneumonia / large bilateral pleural effusion - Likely from bilateral pleural effusions and aspiration pneumonia - Stable respiratory status - Continue oxygen support via nasal cannula to keep oxygen saturation above 90% - Thoracentesis per pulmonary team    Hypokalemia / Hypomagnesemia - Secondary to sepsis - Supplemented and within normal limits    Sacral decubitus ulcer, stage IV - Patient has sacral decubitus ulcer stage IV and multiple other skin wounds - Appreciate wound care assessment - Unstageable Pressure Injury lateral LLE; 14cm x 2cm x 0.1; 95% black eschar with 5% yellow at wound edges;slightly fluctuant - Unstageable Pressure Injury lateral RLE: 5.5cm x 2.5cm x 0.1cm; 50% eschar  with 50% ruptured bulla at wound edges - Stage 4 Pressure Injury sacrum: 6.5cm x 11cm x 1.3cm with 0.5cm of undermining from 11-1 o'clock; 95% red, some early granulation; 5% yellow slough - Stage 3 Pressure Injuries right and left scapula; right-0.5cm x 0.2cm x 0.1cm; left-1.0cm x 0.2cm x 0.2cm; both 100% fibrinous yellow - Dressing procedure/placement/frequency: NPWT dressing change to the sacrum. 1pc of black foam used to fill the sacral wound, skin protected with drape to the right hip then 1pc of black foam used to bridge the TRAC pad away from the wound.  Seal obtained at 158mHG.  Use hydrocolloid at the distal edge of the wound just above the rectum to aid in seal of dressing and protect from bowel incontinence. Silicone foam to the bilateral scapula to protect and insulate. Silicone foam to the RLE wound to protect and insulate. Added enzymatic debridement ointment to the LLE since fluctuant to clear the necrotic tissue and cover with moist gauze dressing. Change NPWT dressings M/W/F, foam dressings every 3 days, and the enzymatic debridement dressing daily. Low air loss mattress in place for pressure redistribution.     Severe protein calorie malnutrition - In the context of chronic illness - SLP evaluation will be done today, last SLP evaluation 05/29/2016 recommended full liquid diet - May continue nutritional supplementation    Quadriplegia - Stable, awaiting palliative care discussion for goals of care    DVT prophylaxis: SCDs bilaterally Code Status: DNR/DNI  Family Communication: No family at the bedside Disposition Plan: Awaiting palliative care for goals of care, at this time patient will remain in step down unit until the goals of care  discussion   Consultants:   Palliative care   SLP - full liquid diet as of 05/29/16  Nutrition   Procedures:   1 unit PRBC 05/30/2016  Antimicrobials:   Vancomycin 06/02/2016 --> 05/30/2016  Zosyn 06/24/2016  -->   Subjective: No overnight events  Objective: Vitals:   05/30/16 0640 05/30/16 0656 05/30/16 0700 05/30/16 0800  BP: 124/64 132/68 132/72 130/64  Pulse: 95 92 93 86  Resp: (!) 21 (!) _0 Temp: 97.9 F (36.6 C) 97.9 F (36.6 C) 97.7 F (36.5 C) 97.2 F (36.2 C)  TempSrc: Core (Comment) Core (Comment)    SpO2: 93% 93% 94% 94%  Weight:      Height:        Intake/Output Summary (Last 24 hours) at 05/30/16 0955 Last data filed at 05/30/16 0900  Gross per 24 hour  Intake           3377.5 ml  Output              425 ml  Net           2952.5 ml   Filed Weights   06/22/2016 0650 06/12/2016 1030  Weight: 48.6 kg (107 lb 2.3 oz) 55.9 kg (123 lb 3.8 oz)    Examination:  General exam: Appears calm and comfortable  Respiratory system: Clear to auscultation. No wheezing  Cardiovascular system: S1 & S2 heard, Rate controlled  Gastrointestinal system: Abdomen is nondistended, soft and nontender. No organomegaly or masses felt. Normal bowel sounds heard. Central nervous system: quadriplegic otherwise no focal deficits  Extremities: edematous UE and lower extremities  Skin: sacral stage 4 decubitus ulcer with wound vac, no rash Psychiatry: Normal mood and behavior   Data Reviewed: I have personally reviewed following labs and imaging studies  CBC:  Recent Labs Lab 06/24/2016 0613 06/18/2016 1147 05/29/16 0357 05/30/16 0505  WBC 8.2 10.2 11.8* 6.8  NEUTROABS 7.5  --   --  6.1  HGB 9.3* 8.9* 7.4* 6.4*  HCT 26.7* 25.3* 20.8* 18.1*  MCV 84.0 84.9 84.6 84.2  PLT 404* 384 372 867   Basic Metabolic Panel:  Recent Labs Lab 06/06/2016 1147 05/29/16 0357 05/29/16 0955 05/29/16 2047 05/30/16 0505  NA 130* 132* 130* 130* 130*  K 2.8* 3.3* 3.4* 4.0 4.1  CL 104 111 107 107 107  CO2 17* 14* 16* 19* 19*  GLUCOSE 89 101* 138* 188* 194*  BUN 6 <5* <5* <5* <5*  CREATININE 0.35* <0.30* 0.32* 0.31* 0.37*  CALCIUM 6.2* 5.8* 6.6* 6.6* 6.6*  MG  --  1.6*  --  2.2 2.2  PHOS  --   1.2*  --  1.8* 1.6*   GFR: Estimated Creatinine Clearance: 58.6 mL/min (by C-G formula based on SCr of 0.37 mg/dL (L)). Liver Function Tests:  Recent Labs Lab 06/20/2016 6720 06/17/2016 1147 05/29/16 0357  AST 158* 233* 510*  ALT 60* 86* 216*  ALKPHOS 131* 124 136*  BILITOT 0.9 0.9 1.2  PROT 4.1* 3.6* 3.9*  ALBUMIN 1.3* 1.2* 2.0*   No results for input(s): LIPASE, AMYLASE in the last 168 hours. No results for input(s): AMMONIA in the last 168 hours. Coagulation Profile: No results for input(s): INR, PROTIME in the last 168 hours. Cardiac Enzymes: No results for input(s): CKTOTAL, CKMB, CKMBINDEX, TROPONINI in the last 168 hours. BNP (last 3 results) No results for input(s): PROBNP in the last 8760 hours. HbA1C: No results for input(s): HGBA1C in the last 72 hours. CBG:  Recent Labs  Lab 05/29/16 1611  GLUCAP 177*   Lipid Profile: No results for input(s): CHOL, HDL, LDLCALC, TRIG, CHOLHDL, LDLDIRECT in the last 72 hours. Thyroid Function Tests: No results for input(s): TSH, T4TOTAL, FREET4, T3FREE, THYROIDAB in the last 72 hours. Anemia Panel: No results for input(s): VITAMINB12, FOLATE, FERRITIN, TIBC, IRON, RETICCTPCT in the last 72 hours. Urine analysis:    Component Value Date/Time   COLORURINE YELLOW 06/26/2016 1113   APPEARANCEUR CLOUDY (A) 06/15/2016 1113   LABSPEC 1.014 06/20/2016 1113   PHURINE 6.5 06/07/2016 1113   GLUCOSEU NEGATIVE 06/05/2016 1113   GLUCOSEU NEG mg/dL 12/19/2008 2131   HGBUR NEGATIVE 06/25/2016 1113   HGBUR moderate 02/26/2009 0829   BILIRUBINUR NEGATIVE 06/14/2016 1113   BILIRUBINUR neg 08/17/2015 1556   KETONESUR NEGATIVE 06/19/2016 1113   PROTEINUR NEGATIVE 06/23/2016 1113   UROBILINOGEN 1.0 08/17/2015 1556   UROBILINOGEN 1.0 03/19/2015 1230   NITRITE NEGATIVE 06/04/2016 1113   LEUKOCYTESUR NEGATIVE 06/04/2016 1113   Sepsis Labs: _0 (procalcitonin:4,lacticidven:4)    Recent Results (from the past 240 hour(s))  Blood  culture (routine x 2)     Status: None (Preliminary result)   Collection Time: 06/06/2016  7:50 AM  Result Value Ref Range Status   Specimen Description BLOOD RIGHT ANTECUBITAL  Final   Special Requests BOTTLES DRAWN AEROBIC AND ANAEROBIC 5ML  Final   Culture   Final    NO GROWTH < 24 HOURS Performed at [facility]    Report Status PENDING  Incomplete  Blood culture (routine x 2)     Status: None (Preliminary result)   Collection Time: 06/07/2016  8:10 AM  Result Value Ref Range Status   Specimen Description BLOOD LEFT HAND  Final   Special Requests IN PEDIATRIC BOTTLE 6ML  Final   Culture   Final    NO GROWTH < 24 HOURS Performed at [facility]    Report Status PENDING  Incomplete  Urine culture     Status: None   Collection Time: 06/12/2016  8:10 AM  Result Value Ref Range Status   Specimen Description URINE, CATHETERIZED  Final   Special Requests NONE  Final   Culture NO GROWTH Performed at [facility]   Final   Report Status 05/29/2016 FINAL  Final  MRSA PCR Screening     Status: None   Collection Time: 05/31/2016 10:36 AM  Result Value Ref Range Status   MRSA by PCR NEGATIVE NEGATIVE Final    Comment:        The GeneXpert MRSA Assay (FDA approved for NASAL specimens only), is one component of a comprehensive MRSA colonization surveillance program. It is not intended to diagnose MRSA infection nor to guide or monitor treatment for MRSA infections.   Culture, Urine     Status: None   Collection Time: 06/22/2016 11:14 AM  Result Value Ref Range Status   Specimen Description URINE, CATHETERIZED  Final   Special Requests NONE  Final   Culture NO GROWTH Performed at [facility]   Final   Report Status 05/30/2016 FINAL  Final      Radiology Studies: Ct Chest Wo Contrast Result Date: 05/29/2016 1. Left upper lobe consolidation consistent with pneumonia and diffuse bronchitis greatest in the left lower lobe. Additional scattered areas of  bronchiolitis predominantly in the left lung. 2. Large right and small to moderate left pleural effusions. Lobular contour to the effusions suggests adhesions. Consider chest ultrasound to evaluate for complex exudate. 3. Probable hepatic steatosis.  Dg Chest Port 1 View Result Date: 06/03/2016  1. Left PICC terminates over the right atrium approximately 3 cm below the cavoatrial junction . 2. Stable nonspecific focal patchy opacity in the left mid lung. Stable patchy bibasilar lung opacities. Findings could represent multilobar pneumonia, with underlying mass not excluded. Follow-up chest imaging to resolution. 3. Stable small bilateral pleural effusions. 4. Aortic atherosclerosis.   Dg Chest Portable 1 View Result Date: 06/04/2016 1. Bilateral pleural effusions 2. Nodular opacity in the left mid lung, neoplasm versus infectious/inflammatory. 3. Radiographic follow-up or chest CT with contrast recommended, depending on clinical setting.     Scheduled Meds: . aspirin  150 mg Rectal Daily  . collagenase   Topical Daily  . famotidine (PEPCID) IV  20 mg Intravenous Q24H  . feeding supplement  1 Container Oral Q24H  . feeding supplement (ENSURE ENLIVE)  237 mL Oral Q24H  . magnesium sulfate 1 - 4 g bolus IVPB  2 g Intravenous Once  . piperacillin-tazobactam (ZOSYN)  IV  3.375 g Intravenous Q8H  . polyethylene glycol  17 g Oral Daily  . potassium & sodium phosphates  1 packet Oral TID AC & HS  . sodium chloride flush  10-40 mL Intracatheter Q12H  . sodium chloride flush  3 mL Intravenous Q12H   Continuous Infusions: . dextrose 5% lactated ringers with KCl 20 mEq/L 75 mL/hr at 05/30/16 0600  . phenylephrine (NEO-SYNEPHRINE) Adult infusion Stopped (05/29/16 1218)     LOS: 2 days    Time spent: 25 minutes  Greater than 50% of the time spent on counseling and coordinating the care.   [name], MD [facility] Pager [phone]  If 7PM-7AM, please contact  night-coverage [email or web address] Password [email or web address] 05/30/2016, 9:55 AM

## 2016-05-30 NOTE — Progress Notes (Signed)
CRITICAL VALUE ALERT  Critical value received:  Hemoglobin 6.4  Date of notification:  05/29/16  Time of notification:  0530  Critical value read back:Yes.    Nurse who received alert:  Kennis Carina, RN  MD notified (1st page):  E-Link MD  Time of first page:  [phone]  MD notified (2nd page):  Time of second page:  Responding MD:  E-Link MD  Time MD responded:  [phone]

## 2016-05-30 NOTE — NC FL2 (Deleted)
[place] LEVEL OF CARE SCREENING TOOL     IDENTIFICATION  Patient Name: [name] Birthdate: [birth year] Sex: female Admission Date (Current Location): 06/02/2016  [facility] and [place] Number:  Herbalist and Address:  [facility],  [place] [address], [place]      Provider Number: [id number]  Attending Physician Name and Address:  Juanito Doom, MD  Relative Name and Phone Number:       Current Level of Care: Hospital Recommended Level of Care: [place] Prior Approval Number:    Date Approved/Denied:   PASRR Number: [id number]  Discharge Plan: SNF    Current Diagnoses: Patient Active Problem List   Diagnosis Date Noted  . Acute respiratory failure with hypoxia ([place])   . HCAP (healthcare-associated pneumonia)   . Sepsis ([place]) 06/25/2016  . CAP (community acquired pneumonia) 06/21/2016  . Paraplegia ([place]) 06/15/2016  . Shock circulatory ([place]) 06/17/2016  . Lactic acidosis 05/31/2016  . Lung nodule   . Protein-calorie malnutrition, severe ([place]) 04/04/2016  . Failure to thrive in adult 04/04/2016  . Palliative care encounter   . Goals of care, counseling/discussion   . Sacral pain   . Infected decubitus ulcer   . Hypomagnesemia   . Severe protein-calorie malnutrition ([place])   . Cervical myelopathy ([place])   . Sacral ulcer ([place]) 03/31/2016  . Sacral decubitus ulcer 02/26/2016  . E. coli UTI   . Myelopathy ([place]) 12/21/2015  . Abnormality of gait   . Acute incomplete spastic tetraplegia ([place])   . Fibromyalgia   . Chronic back pain   . Anxiety state   . Benign essential HTN   . Slow transit constipation   . HLD (hyperlipidemia)   . Urinary frequency   . Cervical spondylosis with myelopathy 12/18/2015  . Chest pain 03/27/2014  . Unspecified constipation 12/02/2013  . Hypokalemia 05/31/2013  . Insomnia 05/29/2011  . Lipoma 02/05/2011  . Hyperlipidemia 09/24/2006  . Depression  04/29/2006  . Essential hypertension 04/29/2006  . Myalgia and myositis 04/29/2006  . GERD 03/15/1998    Orientation RESPIRATION BLADDER Height & Weight     Self, Situation, Place  Normal Indwelling catheter, Incontinent Weight: 123 lb 3.8 oz (55.9 kg) Height:  5\' 9"  (175.3 cm)  BEHAVIORAL SYMPTOMS/MOOD NEUROLOGICAL BOWEL NUTRITION STATUS  Other (Comment) (no behaviors)   Incontinent  (full liquids)  AMBULATORY STATUS COMMUNICATION OF NEEDS Skin   Total Care Verbally Wound Vac, Other (Comment) (sacral decub stage 4-wound vac)                       Personal Care Assistance Level of Assistance  Bathing, Feeding, Dressing Bathing Assistance: Maximum assistance Feeding assistance: Maximum assistance Dressing Assistance: Maximum assistance     Functional Limitations Info  Sight, Hearing, Speech Sight Info: Adequate Hearing Info: Adequate Speech Info: Adequate    SPECIAL CARE FACTORS FREQUENCY                       Contractures Contractures Info: Not present    Additional Factors Info  Code Status, Allergies, Isolation Precautions Code Status Info: DNR             Current Medications (05/30/2016):  This is the current hospital active medication list Current Facility-Administered Medications  Medication Dose Route Frequency Provider Last Rate Last Dose  . aspirin suppository 150 mg  150 mg Rectal Daily Erick Colace, NP   150 mg  at 05/30/16 1141  . collagenase (SANTYL) ointment   Topical Daily Annita Brod, MD      . dextrose 5% in lactated ringers with KCl 20 mEq/L infusion   Intravenous Continuous Wilhelmina Mcardle, MD 75 mL/hr at 05/30/16 1011    . famotidine (PEPCID) IVPB 20 mg premix  20 mg Intravenous Q24H Erick Colace, NP   20 mg at 05/29/16 1559  . feeding supplement (BOOST / RESOURCE BREEZE) liquid 1 Container  1 Container Oral Q24H Juanito Doom, MD      . feeding supplement (ENSURE ENLIVE) (ENSURE ENLIVE) liquid 237 mL  237 mL Oral Q24H  Juanito Doom, MD      . ibuprofen (ADVIL,MOTRIN) tablet 400 mg  400 mg Oral Q6H PRN Juanito Doom, MD   400 mg at 05/29/16 1559  . ondansetron (ZOFRAN) injection 4 mg  4 mg Intravenous Q6H PRN Juanito Doom, MD   4 mg at 05/29/16 1559  . phenylephrine (NEO-SYNEPHRINE) 40 mg in dextrose 5 % 250 mL (0.16 mg/mL) infusion  30-200 mcg/min Intravenous Continuous Juanito Doom, MD   Stopped at 05/29/16 1218  . piperacillin-tazobactam (ZOSYN) IVPB 3.375 g  3.375 g Intravenous [address], RPH   3.375 g at 05/30/16 0509  . polyethylene glycol (MIRALAX / GLYCOLAX) packet 17 g  17 g Oral Daily Annita Brod, MD   Stopped at 06/24/2016 1454  . potassium & sodium phosphates (PHOS-NAK) 280-160-250 MG packet 1 packet  1 packet Oral TID AC & HS Juanito Doom, MD   1 packet at 05/30/16 0849  . sodium chloride flush (NS) 0.9 % injection 10-40 mL  10-40 mL Intracatheter Q12H Juanito Doom, MD   10 mL at 05/30/16 1000  . sodium chloride flush (NS) 0.9 % injection 10-40 mL  10-40 mL Intracatheter PRN Juanito Doom, MD      . sodium chloride flush (NS) 0.9 % injection 3 mL  3 mL Intravenous Q12H Annita Brod, MD   3 mL at 05/30/16 1000     Discharge Medications: Please see discharge summary for a list of discharge medications.  Relevant Imaging Results:  Relevant Lab Results:   Additional Information SS#: [id number]  Jettie Lazare, Randall An, LCSW

## 2016-05-30 NOTE — Progress Notes (Signed)
Speech Language Pathology Treatment: Dysphagia  Patient Details Name: [name] MRN: [id number] DOB: 01-22-1946 Today's Date: 05/30/2016 Time: [id number] SLP Time Calculation (min) (ACUTE ONLY): 13 min  Assessment / Plan / Recommendation Clinical Impression  Dysphagia treatment provided to check diet tolerance. The pt tolerated numerous sips of thin liquid with no overt s/s of aspiration, no changes in O2s or voice quality. Pt has had minimal PO intake today; politely declined trials of puree consistency during treatment. Recommend continuing full liquid diet with meds crushed in puree, ensure pt seated fully upright for meals. Based on results from previous MBS, pt would benefit from supervision to cue multiple swallows along with cues to take small bites/ sips. Will continue to follow for diet tolerance/ consider advancement vs need for repeat objective evaluation.   HPI HPI: 70 y/o female with cervical myelopathy who presented on 11/1 with shock and sepsis in the setting of multiple skin wounds and likely aspiration pneumonia leading to large bilateral pleural effusions.       SLP Plan  Continue with current plan of care     Recommendations  Diet recommendations: Thin liquid (full liquid) Liquids provided via: Cup;Straw Medication Administration: Crushed with puree Supervision: Full supervision/cueing for compensatory strategies Compensations: Minimize environmental distractions;Slow rate;Small sips/bites, multiple swallows Postural Changes and/or Swallow Maneuvers: Seated upright 90 degrees;Upright 30-60 min after meal                Oral Care Recommendations: Oral care BID Follow up Recommendations: Other (comment) (TBD) Plan: Continue with current plan of care       [place], Kelsey Durflinger K, [place], CCC-SLP 05/30/2016, 3:41 PM [phone]

## 2016-05-30 NOTE — Progress Notes (Signed)
Nutrition Follow-up  DOCUMENTATION CODES:   Underweight  INTERVENTION:  - Will order Ensure Enlive once/day, this supplement provides 350 kcal and 20 grams of protein - Will order Boost Breeze once/day, this supplement provides 250 kcal and 9 grams of protein - Continue to encourage PO intakes as mentation allows.  - RD will continue to monitor for additional nutrition-related needs.  NUTRITION DIAGNOSIS:   Inadequate oral intake related to inability to eat as evidenced by NPO status. -diet advanced, no intakes since that time.   GOAL:   Patient will meet greater than or equal to 90% of their needs -unmet  MONITOR:   PO intake, Supplement acceptance, Diet advancement, Weight trends, Labs, Skin, I & O's  ASSESSMENT:   70 y.o. female with medical history significant of for paraplegia, severe protein calorie malnutrition and hypertension who resides at home in the care of her sister. Patient is not a good historian, but reportedly she started feeling worse. She describes feeling worse as hurting all over and feeling very tired. Patient's sister was able to clarify that this started about 2 days ago. When she came into the emergency room, it was noted to have signs consistent with a pneumonia based on chest x-ray. Her white count, temperature and other labs were stable, however lactic acid level was markedly elevated at 3.2. Patient was started on IV antibiotics and aggressive IV fluid resuscitation her initial blood pressure was borderline low at 100s, however repeat blood pressures dropped lower into the 90s.   11/3 Diet advanced from NPO to FLD per SLP recommendation yesterday with associated note at 1110. Per chart review, pt consumed 0% of dinner last night. Pt was appropriate for discussion at time of visit yesterday but is very confused this AM. No family/visitors at bedside. Spoke with RN who reports mentation has been poor this AM; she encouraged PO intakes but pt refused anything  for breakfast this AM.   No new weight since admission. Will trial Ensure Enlive and Boost Breeze, each once/day, and will adjust as warranted at follow-up. Continue to encourage PO intakes as able.   Medications reviewed; 20 mg IV Pepcid once/day, 2 g IV Mg sulfate x1 dose yesterday, PRN IV Zofran, 17 g Miralax once/day, 1 packet Phos-Nak TID, 20 mmol IV KPhos x1 dose yesterday. Labs reviewed; Na: 130 mmol/L, BUN: <5 mg/dL, creatinine: <0.37 mg/dL, Ca: 6.6 mg/dL, Phos: 1.6 mg/dL, LFTs elevated yesterday (no new today).  IVF: D5-LR-20 mEq KCl @ 75 mL/hr (306 kcal).     11/2 - Pt has been NPO since admission.  - Per notes, SLP to evaluate pt for recommendations for diet.  - Also per notes, pt has refused PEG in the past (indication: dysphagia).  - Pt with no teeth present and she reports that she sometimes wears them when eating and sometimes does not wear them with eating.  - Upper denture fits very well but lower denture is ill-fitting.  - Unable to discuss potential swallowing issues at this time.  - She states that for "several weeks" PTA she had a decreased appetite and that she did not eat anything the day before admission d/t poor appetite.  - She states she continues to not feel hungry at this time.  - She denies nausea this AM but states she has feeling of tightness/fullness to abdomen.  - Physical assessment shows moderate edema to extremities which is likely masking muscle and fat depletion; no depletion noted but likely to be present.  - Per chart  review, pt has gained 16 lbs in the past 2 months; likely fluid-related.  - Unable to state malnutrition at this time but highly likely that pt continues to meet criteria for severe malnutrition.    IVF: D5-LR-20 mEq KCl @ 75 mL/hr (306 kcal).    Diet Order:  Diet full liquid Room service appropriate? Yes; Fluid consistency: Thin  Skin: Stage 4 sacral with wound vac, Stage 3 bilateral shoulders, Unstageable/full-thickness  bilateral legs pressure injuries.    Last BM:  11/2  Height:   Ht Readings from Last 1 Encounters:  06/20/2016 5\' 9"  (1.753 m)    Weight:   Wt Readings from Last 1 Encounters:  06/14/2016 123 lb 3.8 oz (55.9 kg)    Ideal Body Weight:  62.61 kg  BMI:  Body mass index is 18.2 kg/m.  Estimated Nutritional Needs:   Kcal:  [id number] (35-40 kcal/kg)  Protein:  112-123 grams (2-2.2 grams/kg)  Fluid:  >/= 1.5 L/day  EDUCATION NEEDS:   No education needs identified at this time    Cindy Matin, MS, RD, LDN Inpatient Clinical Dietitian Pager # [phone] After hours/weekend pager # [phone]
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
## 2016-05-30 NOTE — Procedures (Signed)
Thoracentesis Procedure Note  Pre-operative Diagnosis: large R pleural effusion  Post-operative Diagnosis: same  Indications: concern for empyema based on clinical history   Procedure Details  Consent: Informed consent was obtained. Risks of the procedure were discussed including: infection, bleeding, pain, pneumothorax.  Under sterile conditions the patient was positioned. Betadine solution and sterile drapes were utilized.  1% buffered lidocaine was used to anesthetize the 8th rib space. Fluid was obtained without any difficulties and minimal blood loss.  A dressing was applied to the wound and wound care instructions were provided.   Findings 1500 ml of clear pleural fluid was obtained. A sample was sent to Pathology for cytogenetics, flow, and cell counts, as well as for infection analysis.  Complications:  None; patient tolerated the procedure well.          Condition: stable  Plan A follow up chest x-ray was ordered. Bed Rest for 1 hours. Tylenol 650 mg. for pain.  Attending Attestation: I performed the procedure.  Roselie Awkward, MD [place] PCCM Pager: [phone] Cell: [phone] After 3pm or if no response, call [phone]

## 2016-05-30 NOTE — Progress Notes (Signed)
Date:  May 30, 2016 Chart reviewed for concurrent status and case management needs. Will continue to follow the patient for changes and needs:  Thoracentesis performed for large rt pl. Effusion on [id number].  Hypotensive. Discharge Planning: following for needs Velva Harman, BSN, [place], [place]

## 2016-05-30 NOTE — NC FL2 (Signed)
[place] LEVEL OF CARE SCREENING TOOL     IDENTIFICATION  Patient Name: [name] Birthdate: [birth year] Sex: female Admission Date (Current Location): 06/09/2016  [facility] and [place] Number:  Herbalist and Address:  [facility],  [place] [address] [place]      Provider Number: [id number]  Attending Physician Name and Address:  Juanito Doom, MD  Relative Name and Phone Number:       Current Level of Care: Hospital Recommended Level of Care: [place] Prior Approval Number:    Date Approved/Denied:   PASRR Number: [id number]  Discharge Plan: SNF    Current Diagnoses: Patient Active Problem List   Diagnosis Date Noted  . Acute respiratory failure with hypoxia ([place])   . HCAP (healthcare-associated pneumonia)   . Sepsis ([place]) 06/17/2016  . CAP (community acquired pneumonia) 06/12/2016  . Paraplegia ([place]) 06/15/2016  . Shock circulatory ([place]) 06/02/2016  . Lactic acidosis 06/03/2016  . Lung nodule   . Protein-calorie malnutrition, severe ([place]) 04/04/2016  . Failure to thrive in adult 04/04/2016  . Palliative care encounter   . Goals of care, counseling/discussion   . Sacral pain   . Infected decubitus ulcer   . Hypomagnesemia   . Severe protein-calorie malnutrition ([place])   . Cervical myelopathy ([place])   . Sacral ulcer ([place]) 03/31/2016  . Sacral decubitus ulcer 02/26/2016  . E. coli UTI   . Myelopathy ([place]) 12/21/2015  . Abnormality of gait   . Acute incomplete spastic tetraplegia ([place])   . Fibromyalgia   . Chronic back pain   . Anxiety state   . Benign essential HTN   . Slow transit constipation   . HLD (hyperlipidemia)   . Urinary frequency   . Cervical spondylosis with myelopathy 12/18/2015  . Chest pain 03/27/2014  . Unspecified constipation 12/02/2013  . Hypokalemia 05/31/2013  . Insomnia 05/29/2011  . Lipoma 02/05/2011  . Hyperlipidemia 09/24/2006  . Depression  04/29/2006  . Essential hypertension 04/29/2006  . Myalgia and myositis 04/29/2006  . GERD 03/15/1998    Orientation RESPIRATION BLADDER Height & Weight     Self, Situation, Place  Normal Indwelling catheter, Incontinent Weight: 123 lb 3.8 oz (55.9 kg) Height:  5\' 9"  (175.3 cm)  BEHAVIORAL SYMPTOMS/MOOD NEUROLOGICAL BOWEL NUTRITION STATUS  Other (Comment) (no behaviors)   Incontinent  (full liquids)  AMBULATORY STATUS COMMUNICATION OF NEEDS Skin   Total Care Verbally  (unstageable pressure injury lateral LLE, RLE,, stage 3 R/L scapula, stage 4 sacral decub)                       Personal Care Assistance Level of Assistance  Bathing, Feeding, Dressing Bathing Assistance: Maximum assistance Feeding assistance: Maximum assistance Dressing Assistance: Maximum assistance     Functional Limitations Info  Sight, Hearing, Speech Sight Info: Adequate Hearing Info: Adequate Speech Info: Adequate    SPECIAL CARE FACTORS FREQUENCY                       Contractures Contractures Info: Not present    Additional Factors Info  Code Status Code Status Info: DNR             Current Medications (05/30/2016):  This is the current hospital active medication list Current Facility-Administered Medications  Medication Dose Route Frequency Provider Last Rate Last Dose  . aspirin suppository 150 mg  150 mg Rectal Daily Erick Colace, NP  150 mg at 05/30/16 1141  . collagenase (SANTYL) ointment   Topical Daily Annita Brod, MD      . dextrose 5% in lactated ringers with KCl 20 mEq/L infusion   Intravenous Continuous Wilhelmina Mcardle, MD 75 mL/hr at 05/30/16 1011    . famotidine (PEPCID) IVPB 20 mg premix  20 mg Intravenous Q24H Erick Colace, NP   20 mg at 05/29/16 1559  . feeding supplement (BOOST / RESOURCE BREEZE) liquid 1 Container  1 Container Oral Q24H Juanito Doom, MD      . feeding supplement (ENSURE ENLIVE) (ENSURE ENLIVE) liquid 237 mL  237 mL Oral Q24H  Juanito Doom, MD      . ibuprofen (ADVIL,MOTRIN) tablet 400 mg  400 mg Oral Q6H PRN Juanito Doom, MD   400 mg at 05/29/16 1559  . ondansetron (ZOFRAN) injection 4 mg  4 mg Intravenous Q6H PRN Juanito Doom, MD   4 mg at 05/29/16 1559  . phenylephrine (NEO-SYNEPHRINE) 40 mg in dextrose 5 % 250 mL (0.16 mg/mL) infusion  30-200 mcg/min Intravenous Continuous Juanito Doom, MD   Stopped at 05/29/16 1218  . piperacillin-tazobactam (ZOSYN) IVPB 3.375 g  3.375 g Intravenous Q8H Dorrene German, RPH   3.375 g at 05/30/16 1349  . polyethylene glycol (MIRALAX / GLYCOLAX) packet 17 g  17 g Oral Daily Annita Brod, MD   Stopped at 06/06/2016 1454  . potassium & sodium phosphates (PHOS-NAK) 280-160-250 MG packet 1 packet  1 packet Oral TID AC & HS Juanito Doom, MD   1 packet at 05/30/16 0849  . sodium chloride flush (NS) 0.9 % injection 10-40 mL  10-40 mL Intracatheter Q12H Juanito Doom, MD   10 mL at 05/30/16 1000  . sodium chloride flush (NS) 0.9 % injection 10-40 mL  10-40 mL Intracatheter PRN Juanito Doom, MD      . sodium chloride flush (NS) 0.9 % injection 3 mL  3 mL Intravenous Q12H Annita Brod, MD   3 mL at 05/30/16 1000     Discharge Medications: Please see discharge summary for a list of discharge medications.  Relevant Imaging Results:  Relevant Lab Results:   Additional Information SS#: [id number]  Donnalyn Juran, Randall An, LCSW

## 2016-05-30 NOTE — Progress Notes (Signed)
[facility] Progress Note Patient Name: [name] DOB: April 26, 1946 MRN: [id number]   Date of Service  05/30/2016  HPI/Events of Note  Watery diarrhea - request for Flexiseal and C. Difficile Toxin.   eICU Interventions  Will order: 1. Place Flexiseal. 2. C. Difficle Toxin now.  3. Enteric precautions.      Intervention Category Intermediate Interventions: Other:  Lysle Dingwall 05/30/2016, 6:14 PM

## 2016-05-30 NOTE — Consult Note (Signed)
[facility] Nurse wound follow up Wound type: Chronic non-healing sacral pressure injury, Stage 4. RNs are to change other wounds according to guidance provided via the Orders on Wednesday. Measurement:per Wednesday Wound bed:95% red, moist. Bone directly palpable in the center.   Drainage (amount, consistency, odor) serous, moderate amount Periwound:intact. Dressing procedure/placement/frequency: NPWT dressing with visible soiling, but dressing remains intact. Dressing removed, wound cleansed with NS, and gently patted dry.  A one-half piece of hydrocolloid is used to fill the space between the distal portion of wound and the rectum due to stooling (thin, liquid feces). One (1) piece of black foam used to obliterate dead space, one (1) piece of black foam used to form a bridge to the right buttock (2 total). Drape used to protect the periwound skin. Attached to 186mmHg constant negative pressure and an immediate seal is achieved. It is suggested to the bedside RNs that an indwelling bowel management system may be indicated at this time.   Next dressing change is due on Monday, 06/02/16. [facility] nursing team will follow to assist with NPWT dressings due to bridging, and will remain available to this patient, the nursing and medical teams.  Thanks, Maudie Flakes, MSN, RN, [place], Arther Abbott  Pager# [phone]

## 2016-05-31 LAB — HEPATIC FUNCTION PANEL
ALBUMIN: 1.6 g/dL — AB (ref 3.5–5.0)
ALK PHOS: 331 U/L — AB (ref 38–126)
ALT: 259 U/L — AB (ref 14–54)
AST: 194 U/L — AB (ref 15–41)
BILIRUBIN DIRECT: 0.4 mg/dL (ref 0.1–0.5)
BILIRUBIN TOTAL: 1.1 mg/dL (ref 0.3–1.2)
Indirect Bilirubin: 0.7 mg/dL (ref 0.3–0.9)
Total Protein: 3.6 g/dL — ABNORMAL LOW (ref 6.5–8.1)

## 2016-05-31 LAB — CBC WITH DIFFERENTIAL/PLATELET
BASOS ABS: 0 10*3/uL (ref 0.0–0.1)
BASOS PCT: 0 %
Eosinophils Absolute: 0 10*3/uL (ref 0.0–0.7)
Eosinophils Relative: 0 %
HEMATOCRIT: 29.6 % — AB (ref 36.0–46.0)
HEMOGLOBIN: 10.5 g/dL — AB (ref 12.0–15.0)
LYMPHS PCT: 6 %
Lymphs Abs: 0.5 10*3/uL — ABNORMAL LOW (ref 0.7–4.0)
MCH: 30.2 pg (ref 26.0–34.0)
MCHC: 35.5 g/dL (ref 30.0–36.0)
MCV: 85.1 fL (ref 78.0–100.0)
MONO ABS: 0.2 10*3/uL (ref 0.1–1.0)
Monocytes Relative: 3 %
NEUTROS ABS: 7.6 10*3/uL (ref 1.7–7.7)
NEUTROS PCT: 92 %
Platelets: 247 10*3/uL (ref 150–400)
RBC: 3.48 MIL/uL — ABNORMAL LOW (ref 3.87–5.11)
RDW: 16.4 % — AB (ref 11.5–15.5)
WBC: 8.3 10*3/uL (ref 4.0–10.5)

## 2016-05-31 LAB — C DIFFICILE QUICK SCREEN W PCR REFLEX
C Diff antigen: NEGATIVE
C Diff interpretation: NOT DETECTED
C Diff toxin: NEGATIVE

## 2016-05-31 LAB — BASIC METABOLIC PANEL
ANION GAP: 6 (ref 5–15)
BUN: <5 mg/dL — ABNORMAL LOW (ref 6–20)
CALCIUM: 6.9 mg/dL — AB (ref 8.9–10.3)
CO2: 18 mmol/L — AB (ref 22–32)
Chloride: 107 mmol/L (ref 101–111)
Creatinine, Ser: 0.31 mg/dL — ABNORMAL LOW (ref 0.44–1.00)
GLUCOSE: 193 mg/dL — AB (ref 65–99)
POTASSIUM: 4.3 mmol/L (ref 3.5–5.1)
Sodium: 131 mmol/L — ABNORMAL LOW (ref 135–145)

## 2016-05-31 LAB — GLUCOSE, CAPILLARY: Glucose-Capillary: 185 mg/dL — ABNORMAL HIGH (ref 65–99)

## 2016-05-31 LAB — MAGNESIUM: Magnesium: 2.3 mg/dL (ref 1.7–2.4)

## 2016-05-31 LAB — PHOSPHORUS: PHOSPHORUS: 1.2 mg/dL — AB (ref 2.5–4.6)

## 2016-05-31 MED ORDER — ASPIRIN 81 MG PO CHEW
162.0000 mg | CHEWABLE_TABLET | Freq: Every day | ORAL | Status: DC
  Administered 2016-05-31: 162 mg via ORAL
  Filled 2016-05-31: qty 2

## 2016-05-31 NOTE — Progress Notes (Signed)
Patient ID: [name], female   DOB: November 12, 1945, 70 y.o.   MRN: [id number]  PROGRESS NOTE    [name]  WUJ:[id number] DOB: 07/31/1945 DOA: 06/20/2016  PCP: Laurey Morale, MD   Brief Narrative:  70 year old female with cervical myelopathy and quadriplegia, severe protein calorie malnutrition, hypertension. She presented to hospital 06/21/2016 with generalized pain, fatigue. Patient was found to have septic shock secondary to pneumonia and multiple skin wounds. She also was found to have large bilateral pleural effusions. Patient was under critical care medicine through 05/30/2016.   Assessment & Plan:   Principal Problem:   Sepsis secondary to aspiration pneumonia ([place]) / leukocytosis - Sepsis criteria met on the admission. Patient required brief course of pressor support yesterday but currently is off of pressors - She was on vancomycin and Zosyn but currently on Zosyn only - Blood cultures so far show no growth to date - Urine culture shows no growth to date - We'll continue to monitor in step down unit due to hypothermia this am of 95.4 F  Active Problems:   Acute respiratory failure with hypoxia / aspiration pneumonia / large bilateral pleural effusion - Likely from bilateral pleural effusions and aspiration pneumonia - Stable respiratory status - Continue oxygen support via nasal cannula to keep oxygen saturation above 90% - Toracentesis done 11/3 with 1500 ml of clear fluids removed and rent for cytogenetics, flow and cell counts  - CXR done 11/3 showed successful removal of considerable pleural fluid on the right. A small amount remains. There is no postprocedure pneumothorax. Worsening of alveolar opacity in the left mid and upper lung consistent with progressive pneumonia or postobstructive atelectasis. Moderate-sized left pleural effusion which appears to be loculated laterally    Hypokalemia / Hypomagnesemia - Secondary to sepsis - Supplemented and  within normal limits    Sacral decubitus ulcer, stage IV - Patient has sacral decubitus ulcer stage IV and multiple other skin wounds - Appreciate wound care assessment - Unstageable Pressure Injury lateral LLE; 14cm x 2cm x 0.1; 95% black eschar with 5% yellow at wound edges;slightly fluctuant - Unstageable Pressure Injury lateral RLE: 5.5cm x 2.5cm x 0.1cm; 50% eschar with 50% ruptured bulla at wound edges - Stage 4 Pressure Injury sacrum: 6.5cm x 11cm x 1.3cm with 0.5cm of undermining from 11-1 o'clock; 95% red, some early granulation; 5% yellow slough - Stage 3 Pressure Injuries right and left scapula; right-0.5cm x 0.2cm x 0.1cm; left-1.0cm x 0.2cm x 0.2cm; both 100% fibrinous yellow - Dressing procedure/placement/frequency: NPWT dressing change to the sacrum. 1pc of black foam used to fill the sacral wound, skin protected with drape to the right hip then 1pc of black foam used to bridge the TRAC pad away from the wound.  Seal obtained at 159mHG.  Use hydrocolloid at the distal edge of the wound just above the rectum to aid in seal of dressing and protect from bowel incontinence. Silicone foam to the bilateral scapula to protect and insulate. Silicone foam to the RLE wound to protect and insulate. Added enzymatic debridement ointment to the LLE since fluctuant to clear the necrotic tissue and cover with moist gauze dressing. Change NPWT dressings M/W/F, foam dressings every 3 days, and the enzymatic debridement dressing daily. Low air loss mattress in place for pressure redistribution.     Severe protein calorie malnutrition - In the context of chronic illness - SLP evaluation will be done today, last SLP evaluation 05/29/2016 recommended full liquid diet - May continue  nutritional supplementation    Quadriplegia - Stable, awaiting palliative care discussion for goals of care    DVT prophylaxis: SCDs bilaterally Code Status: DNR/DNI  Family Communication: No family at the  bedside Disposition Plan: To SNF once stable, possibly by 06/03/2016   Consultants:   Palliative care   SLP - full liquid diet as of 05/29/16  Nutrition   Procedures:   1 unit PRBC 05/30/2016  Thoracentesis 11/3 - 1.5 L fluid removed   Antimicrobials:   Vancomycin 06/05/2016 --> 05/30/2016  Zosyn 06/02/2016 -->   Subjective: No overnight events  Objective: Vitals:   05/31/16 0400 05/31/16 0500 05/31/16 0600 05/31/16 0700  BP: 117/89 118/73 119/77 121/78  Pulse: 77 79 79 79  Resp: 17 13 (!) 24 (!) 26  Temp: (!) 95.4 F (35.2 C) (!) 95.5 F (35.3 C) (!) 95.5 F (35.3 C) (!) 95.4 F (35.2 C)  TempSrc:      SpO2: 90% (!) 89% (!) 89% 90%  Weight:      Height:        Intake/Output Summary (Last 24 hours) at 05/31/16 0911 Last data filed at 05/31/16 0745  Gross per 24 hour  Intake             1415 ml  Output              305 ml  Net             1110 ml   Filed Weights   06/19/2016 0650 06/10/2016 1030  Weight: 48.6 kg (107 lb 2.3 oz) 55.9 kg (123 lb 3.8 oz)    Examination:  General exam: Appears calm and comfortable, no distress   Respiratory system: diminished but no wheezing  Cardiovascular system: S1 & S2 heard, Rate controlled  Gastrointestinal system: (+) BS, non tender abdomen  Central nervous system: quadriplegic otherwise no focal deficits  Extremities: edematous UE and lower extremities  Skin: sacral stage 4 decubitus ulcer with wound vac, no rash Psychiatry: Normal mood and behavior, not agitated or restless   Data Reviewed: I have personally reviewed following labs and imaging studies  CBC:  Recent Labs Lab 06/02/2016 0613 06/18/2016 1147 05/29/16 0357 05/30/16 0505 05/31/16 0500  WBC 8.2 10.2 11.8* 6.8 8.3  NEUTROABS 7.5  --   --  6.1 7.6  HGB 9.3* 8.9* 7.4* 6.4* 10.5*  HCT 26.7* 25.3* 20.8* 18.1* 29.6*  MCV 84.0 84.9 84.6 84.2 85.1  PLT 404* 384 372 267 003   Basic Metabolic Panel:  Recent Labs Lab 05/29/16 0357 05/29/16 0955  05/29/16 2047 05/30/16 0505 05/31/16 0500  NA 132* 130* 130* 130* 131*  K 3.3* 3.4* 4.0 4.1 4.3  CL 111 107 107 107 107  CO2 14* 16* 19* 19* 18*  GLUCOSE 101* 138* 188* 194* 193*  BUN <5* <5* <5* <5* <5*  CREATININE <0.30* 0.32* 0.31* 0.37* 0.31*  CALCIUM 5.8* 6.6* 6.6* 6.6* 6.9*  MG 1.6*  --  2.2 2.2 2.3  PHOS 1.2*  --  1.8* 1.6* 1.2*   GFR: Estimated Creatinine Clearance: 58.6 mL/min (by C-G formula based on SCr of 0.31 mg/dL (L)). Liver Function Tests:  Recent Labs Lab 06/12/2016 7048 06/12/2016 1147 05/29/16 0357 05/30/16 1022 05/30/16 1035 05/31/16 0500  AST 158* 233* 510* 441*  --  194*  ALT 60* 86* 216* 319*  --  259*  ALKPHOS 131* 124 136* 295*  --  331*  BILITOT 0.9 0.9 1.2 1.2  --  1.1  PROT 4.1* 3.6*  3.9* 3.7* 3.8* 3.6*  ALBUMIN 1.3* 1.2* 2.0* 1.8*  --  1.6*   No results for input(s): LIPASE, AMYLASE in the last 168 hours. No results for input(s): AMMONIA in the last 168 hours. Coagulation Profile: No results for input(s): INR, PROTIME in the last 168 hours. Cardiac Enzymes: No results for input(s): CKTOTAL, CKMB, CKMBINDEX, TROPONINI in the last 168 hours. BNP (last 3 results) No results for input(s): PROBNP in the last 8760 hours. HbA1C: No results for input(s): HGBA1C in the last 72 hours. CBG:  Recent Labs Lab 05/29/16 1611 05/31/16 0849  GLUCAP 177* 185*   Lipid Profile: No results for input(s): CHOL, HDL, LDLCALC, TRIG, CHOLHDL, LDLDIRECT in the last 72 hours. Thyroid Function Tests: No results for input(s): TSH, T4TOTAL, FREET4, T3FREE, THYROIDAB in the last 72 hours. Anemia Panel: No results for input(s): VITAMINB12, FOLATE, FERRITIN, TIBC, IRON, RETICCTPCT in the last 72 hours. Urine analysis:    Component Value Date/Time   COLORURINE YELLOW 06/04/2016 1113   APPEARANCEUR CLOUDY (A) 05/30/2016 1113   LABSPEC 1.014 05/31/2016 1113   PHURINE 6.5 06/07/2016 1113   GLUCOSEU NEGATIVE 06/19/2016 1113   GLUCOSEU NEG mg/dL 12/19/2008 2131    HGBUR NEGATIVE 06/17/2016 1113   HGBUR moderate 02/26/2009 0829   BILIRUBINUR NEGATIVE 06/25/2016 1113   BILIRUBINUR neg 08/17/2015 1556   KETONESUR NEGATIVE 06/07/2016 1113   PROTEINUR NEGATIVE 06/05/2016 1113   UROBILINOGEN 1.0 08/17/2015 1556   UROBILINOGEN 1.0 03/19/2015 1230   NITRITE NEGATIVE 06/22/2016 1113   LEUKOCYTESUR NEGATIVE 06/26/2016 1113   Sepsis Labs: _0 (procalcitonin:4,lacticidven:4)    Recent Results (from the past 240 hour(s))  Blood culture (routine x 2)     Status: None (Preliminary result)   Collection Time: 06/08/2016  7:50 AM  Result Value Ref Range Status   Specimen Description BLOOD RIGHT ANTECUBITAL  Final   Special Requests BOTTLES DRAWN AEROBIC AND ANAEROBIC 5ML  Final   Culture   Final    NO GROWTH 2 DAYS Performed at [facility]    Report Status PENDING  Incomplete  Blood culture (routine x 2)     Status: None (Preliminary result)   Collection Time: 06/09/2016  8:10 AM  Result Value Ref Range Status   Specimen Description BLOOD LEFT HAND  Final   Special Requests IN PEDIATRIC BOTTLE 6ML  Final   Culture   Final    NO GROWTH 2 DAYS Performed at [facility]    Report Status PENDING  Incomplete  Urine culture     Status: None   Collection Time: 05/29/2016  8:10 AM  Result Value Ref Range Status   Specimen Description URINE, CATHETERIZED  Final   Special Requests NONE  Final   Culture NO GROWTH Performed at [facility]   Final   Report Status 05/29/2016 FINAL  Final  MRSA PCR Screening     Status: None   Collection Time: 06/22/2016 10:36 AM  Result Value Ref Range Status   MRSA by PCR NEGATIVE NEGATIVE Final    Comment:        The GeneXpert MRSA Assay (FDA approved for NASAL specimens only), is one component of a comprehensive MRSA colonization surveillance program. It is not intended to diagnose MRSA infection nor to guide or monitor treatment for MRSA infections.   Culture, Urine     Status: None    Collection Time: 06/05/2016 11:14 AM  Result Value Ref Range Status   Specimen Description URINE, CATHETERIZED  Final   Special Requests  NONE  Final   Culture NO GROWTH Performed at [facility]   Final   Report Status 05/30/2016 FINAL  Final  Body fluid culture     Status: None (Preliminary result)   Collection Time: 05/30/16 10:15 AM  Result Value Ref Range Status   Specimen Description PLEURAL  Final   Special Requests NONE  Final   Gram Stain   Final    RARE WBC PRESENT, PREDOMINANTLY MONONUCLEAR NO ORGANISMS SEEN Performed at [facility]    Culture PENDING  Incomplete   Report Status PENDING  Incomplete      Radiology Studies: Ct Chest Wo Contrast Result Date: 05/29/2016 1. Left upper lobe consolidation consistent with pneumonia and diffuse bronchitis greatest in the left lower lobe. Additional scattered areas of bronchiolitis predominantly in the left lung. 2. Large right and small to moderate left pleural effusions. Lobular contour to the effusions suggests adhesions. Consider chest ultrasound to evaluate for complex exudate. 3. Probable hepatic steatosis.   Dg Chest Port 1 View Result Date: 06/17/2016  1. Left PICC terminates over the right atrium approximately 3 cm below the cavoatrial junction . 2. Stable nonspecific focal patchy opacity in the left mid lung. Stable patchy bibasilar lung opacities. Findings could represent multilobar pneumonia, with underlying mass not excluded. Follow-up chest imaging to resolution. 3. Stable small bilateral pleural effusions. 4. Aortic atherosclerosis.   Dg Chest Portable 1 View Result Date: 05/30/2016 1. Bilateral pleural effusions 2. Nodular opacity in the left mid lung, neoplasm versus infectious/inflammatory. 3. Radiographic follow-up or chest CT with contrast recommended, depending on clinical setting.     Scheduled Meds: . aspirin  150 mg Rectal Daily  . collagenase   Topical Daily  . famotidine (PEPCID) IV  20 mg  Intravenous Q24H  . feeding supplement  1 Container Oral Q24H  . feeding supplement (ENSURE ENLIVE)  237 mL Oral Q24H  . piperacillin-tazobactam (ZOSYN)  IV  3.375 g Intravenous Q8H  . polyethylene glycol  17 g Oral Daily  . potassium & sodium phosphates  1 packet Oral TID AC & HS  . sodium chloride flush  10-40 mL Intracatheter Q12H  . sodium chloride flush  3 mL Intravenous Q12H   Continuous Infusions: . dextrose 5% lactated ringers with KCl 20 mEq/L 75 mL/hr at 05/30/16 1011  . phenylephrine (NEO-SYNEPHRINE) Adult infusion Stopped (05/29/16 1218)     LOS: 3 days    Time spent: 25 minutes  Greater than 50% of the time spent on counseling and coordinating the care.   [name], MD [facility] Pager [phone]  If 7PM-7AM, please contact night-coverage [email or web address] Password [email or web address] 05/31/2016, 9:11 AM

## 2016-05-31 NOTE — Progress Notes (Signed)
Pharmacy Antibiotic Note  [name] is a 70 y.o. female admitted on 06/21/2016 with sepsis.  Pharmacy initially consulted for zosyn/vancomycin dosing.  Pt only on Zosyn presently.  Plan: Continue Zosyn 3.375 Gm IV q8h EI NOTE - paraplegic so SCr may overestimate CrCl  Height: 5\' 9"  (175.3 cm) Weight: 123 lb 3.8 oz (55.9 kg) IBW/kg (Calculated) : 66.2  Temp (24hrs), Avg:96.1 F (35.6 C), Min:94.5 F (34.7 C), Max:99 F (37.2 C)   Recent Labs Lab 06/20/2016 0613  06/11/2016 0757 06/26/2016 1147 06/25/2016 2139 05/29/16 0357 05/29/16 0955 05/29/16 0956 05/29/16 1656 05/29/16 2047 05/30/16 0505 05/31/16 0500  WBC 8.2  --   --  10.2  --  11.8*  --   --   --   --  6.8 8.3  CREATININE 0.31*  --   --  0.35*  --  <0.30* 0.32*  --   --  0.31* 0.37* 0.31*  LATICACIDVEN  --   < > 3.51* 4.1* 5.0*  --   --  3.1* 3.1*  --   --   --   < > = values in this interval not displayed.  Estimated Creatinine Clearance: 58.6 mL/min (by C-G formula based on SCr of 0.31 mg/dL (L)).    Allergies  Allergen Reactions  . Aleve [Naproxen Sodium] Other (See Comments)    Doesn't work..  . Codeine Nausea And Vomiting and Other (See Comments)    "head explode"   . Prednisone Swelling    Swelling of throat  . Tylenol [Acetaminophen]     Headaches per pt  . Ciprofloxacin Nausea And Vomiting  . Escitalopram Oxalate Nausea And Vomiting  . Gabapentin Nausea And Vomiting  . Hydrocodone Nausea And Vomiting  . Oxybutynin Chloride Nausea And Vomiting    Antimicrobials this admission: 11/1 zosyn >>  11/1 vancomycin >> 11/3  Dose adjustments this admission:   Microbiology results:  11/1 BCx: ngtd  11/1 UCx:  NGF  Sputum:    MRSA PCR:   Thank you for allowing pharmacy to be a part of this patient's care.  Dolly Rias RPh 05/31/2016, 10:31 AM Pager [phone]

## 2016-05-31 NOTE — Progress Notes (Addendum)
Patient currently refusing to taking anything PO other than her medications and also will not let RN place bair hugger on her at this time. RN spoke with Dr. Charlies Silvers who has been made aware. Dr. Charlies Silvers and Palliative MD to continue to follow pt.  Noted, pt declined bear hugger, oxygen. Will continue to encourage at least supportive care. Palliative care on board.   [name] [facility] [phone]

## 2016-06-01 DIAGNOSIS — R0600 Dyspnea, unspecified: Secondary | ICD-10-CM

## 2016-06-01 LAB — PH, BODY FLUID: pH, Body Fluid: 7.8

## 2016-06-01 LAB — MAGNESIUM: MAGNESIUM: 2.3 mg/dL (ref 1.7–2.4)

## 2016-06-01 LAB — PHOSPHORUS: PHOSPHORUS: 1.6 mg/dL — AB (ref 2.5–4.6)

## 2016-06-01 MED ORDER — FENTANYL CITRATE (PF) 100 MCG/2ML IJ SOLN: 12.5000 ug | INTRAMUSCULAR | Status: DC | PRN

## 2016-06-02 LAB — TYPE AND SCREEN
ABO/RH(D): B POS
Antibody Screen: NEGATIVE
Unit division: 0

## 2016-06-02 LAB — BODY FLUID CULTURE: CULTURE: NO GROWTH

## 2016-06-02 LAB — CULTURE, BLOOD (ROUTINE X 2)
Culture: NO GROWTH
Culture: NO GROWTH

## 2016-06-27 NOTE — Progress Notes (Signed)
Time of Death 2156 verified by Garner Gavel and  Louis Meckel RN. Family and NP notifed.

## 2016-06-27 NOTE — Progress Notes (Signed)
Chaplain directed to the room of [name] by staff as she is near EOL. She was surrounded by loving and supportive family. Family reports they are receiving spiritual support from their communities of faith. Chaplain spoke to [name] and was thanked in a very soft and weak voice.  Page chaplain if [name] or family require further support.  [name]. [name], [name]

## 2016-06-27 NOTE — Progress Notes (Addendum)
Daily Progress Note   Patient Name: [name]       Date: 08-Jun-2016 DOB: [birth year]  Age: 70 y.o. MRN#: [id number] Attending Physician: Robbie Lis, MD Primary Care Physician: Laurey Morale, MD Admit Date: 06/07/2016  Reason for Consultation/Follow-up: Establishing goals of care and Terminal Care  Subjective:  patient is less awake/alert this morning.  Her temperatures are low, her O2 saturations are low, she is on NRB mask.  She opens eyes when name is called but is not able to speak much meaningfully.  Call placed and discussed with sister, see below:   Length of Stay: 4  Current Medications: Scheduled Meds:  . aspirin  162 mg Oral Daily  . collagenase   Topical Daily  . famotidine (PEPCID) IV  20 mg Intravenous Q24H  . feeding supplement  1 Container Oral Q24H  . feeding supplement (ENSURE ENLIVE)  237 mL Oral Q24H  . piperacillin-tazobactam (ZOSYN)  IV  3.375 g Intravenous Q8H  . polyethylene glycol  17 g Oral Daily  . sodium chloride flush  10-40 mL Intracatheter Q12H  . sodium chloride flush  3 mL Intravenous Q12H    Continuous Infusions: . dextrose 5% lactated ringers with KCl 20 mEq/L 75 mL/hr at 06/08/16 0215  . phenylephrine (NEO-SYNEPHRINE) Adult infusion Stopped (05/29/16 1218)    PRN Meds: ibuprofen, ondansetron (ZOFRAN) IV, sodium chloride flush  Physical Exam         Weak frail shallow breathing Diminished breath sounds through out bair hugger on  Edema Abdomen distended Has stage IV decubitus ulcer  Vital Signs: BP 106/64   Pulse 86   Temp (!) 93.4 F (34.1 C)   Resp (!) 25   Ht 5\' 9"  (1.753 m)   Wt 55.9 kg (123 lb 3.8 oz)   LMP 03/31/2004   SpO2 90%   BMI 18.20 kg/m  SpO2: SpO2: 90 % O2 Device: O2 Device: NRB O2 Flow Rate: O2  Flow Rate (L/min): 8 L/min  Intake/output summary:   Intake/Output Summary (Last 24 hours) at 2016/06/08 0849 Last data filed at June 08, 2016 0614  Gross per 24 hour  Intake          43539.5 ml  Output              610 ml  Net  42929.5 ml   LBM: Last BM Date: 04/30/16 Baseline Weight: Weight: 48.6 kg (107 lb 2.3 oz) Most recent weight: Weight: 55.9 kg (123 lb 3.8 oz)       Palliative Assessment/Data:    Flowsheet Rows   Flowsheet Row Most Recent Value  Intake Tab  Referral Department  Hospitalist  Unit at Time of Referral  ICU  Palliative Care Primary Diagnosis  Other (Comment) [pleural effusion, PNA, cervical myelopathy ]  Palliative Care Type  Return patient Palliative Care  Reason for referral  Non-pain Symptom, Clarify Goals of Care, End of Life Care Assistance  Clinical Assessment  Palliative Performance Scale Score  10%  Pain Max last 24 hours  4  Pain Min Last 24 hours  3  Dyspnea Max Last 24 Hours  4  Dyspnea Min Last 24 hours  3  Nausea Max Last 24 Hours  4  Nausea Min Last 24 Hours  3  Psychosocial & Spiritual Assessment  Palliative Care Outcomes  Patient/Family meeting held?  Yes  Who was at the meeting?  sister Ms Bobette Mo.   Palliative Care Outcomes  Clarified goals of care  Palliative Care follow-up planned  Yes, Facility      Patient Active Problem List   Diagnosis Date Noted  . Acute respiratory failure with hypoxia ([place])   . HCAP (healthcare-associated pneumonia)   . Sepsis ([place]) 06/19/2016  . CAP (community acquired pneumonia) 06/14/2016  . Paraplegia ([place]) 06/09/2016  . Shock circulatory ([place]) 06/17/2016  . Lactic acidosis 06/07/2016  . Lung nodule   . Protein-calorie malnutrition, severe ([place]) 04/04/2016  . Failure to thrive in adult 04/04/2016  . Encounter for palliative care   . Goals of care, counseling/discussion   . Sacral pain   . Infected decubitus ulcer   . Hypomagnesemia   . Severe protein-calorie malnutrition ([place])   .  Cervical myelopathy ([place])   . Sacral ulcer ([place]) 03/31/2016  . Sacral decubitus ulcer 02/26/2016  . E. coli UTI   . Myelopathy ([place]) 12/21/2015  . Abnormality of gait   . Acute incomplete spastic tetraplegia ([place])   . Fibromyalgia   . Chronic back pain   . Anxiety state   . Benign essential HTN   . Slow transit constipation   . HLD (hyperlipidemia)   . Urinary frequency   . Cervical spondylosis with myelopathy 12/18/2015  . Chest pain 03/27/2014  . Unspecified constipation 12/02/2013  . Hypokalemia 05/31/2013  . Insomnia 05/29/2011  . Lipoma 02/05/2011  . Hyperlipidemia 09/24/2006  . Depression 04/29/2006  . Essential hypertension 04/29/2006  . Myalgia and myositis 04/29/2006  . GERD 03/15/1998    Palliative Care Assessment & Plan   Patient Profile:    Assessment:  sepsis, aspiration PNA, recurrent pleural effusions Stage IV sacral decubitus Severe protein calorie malnutrition Quadriplegia cervical myelopathy   Recommendations/Plan:   Patient appears to have entered into the dying process, she might have a hospital death. Continue supportive measures such as NRB mask, bair hugger, antibiotics.   Add Fentanyl IV PRN.   Call placed and discussed with sister Ms Bobette Mo regarding the patient's ongoing decline and hemo dynamic instability: discussed about the possibility of hospital death, discussed about shifting towards full comfort measures. Would not start BiPAP currently, patient is not awake/alert, she is on NRB mask. Suspect this to be a progression of the patient's multiple underlying processes, now progressing towards end of life.    Code Status:    Code Status Orders  Start     Ordered   06/15/2016 1115  Do not attempt resuscitation (DNR)  Continuous    Question Answer Comment  In the event of cardiac or respiratory ARREST Do not call a "code blue"   In the event of cardiac or respiratory ARREST Do not perform Intubation, CPR, defibrillation or  ACLS   In the event of cardiac or respiratory ARREST Use medication by any route, position, wound care, and other measures to relive pain and suffering. May use oxygen, suction and manual treatment of airway obstruction as needed for comfort.      06/25/2016 1122    Code Status History    Date Active Date Inactive Code Status Order ID Comments User Context   03/31/2016  4:53 PM 04/05/2016 11:43 PM Full Code [id number]  Tawni Millers, MD Inpatient   12/21/2015  1:40 PM 01/15/2016  2:14 PM Full Code [id number]  Cathlyn Parsons, PA-C Inpatient       Prognosis:   Hours - Days  Discharge Planning:  Anticipated Hospital Death  Care plan was discussed with  Patient RN sister  Thank you for allowing the Palliative Medicine Team to assist in the care of this patient.   Time In: 8 Time Out: 835 Total Time 35 Prolonged Time Billed  no       Greater than 50%  of this time was spent counseling and coordinating care related to the above assessment and plan.  Loistine Chance, MD [phone]  Please contact Palliative Medicine Team phone at [phone] for questions and concerns.   Addendum: 06/29/16 11 am.  Sister HCPOA Ms. Bobette Mo arrived at the bedside this morning:  Discussed about the patient's current condition. DNR DNI. PRN Fentanyl. No BiPAP Appreciate chaplain visit and input.

## 2016-06-27 NOTE — Progress Notes (Addendum)
Patient ID: [name], female   DOB: 21-Feb-1946, 70 y.o.   MRN: [id number]  PROGRESS NOTE    [name]  ZPH:[id number] DOB: 1946-01-04 DOA: 06/18/2016  PCP: Laurey Morale, MD   Brief Narrative:  70 year old female with cervical myelopathy and quadriplegia, severe protein calorie malnutrition, hypertension. She presented to hospital 06/24/2016 with generalized pain, fatigue. Patient was found to have septic shock secondary to pneumonia and multiple skin wounds. She also was found to have large bilateral pleural effusions. Patient was under critical care medicine through 05/30/2016.  Patient has significantly declined over past 24-48 hours. She continues to be hypothermic and declining to have warming blankets. She is also hypoxic and does not want to be on a nonrebreather mask. Appreciate very much palliative care addressing goals of care.   Assessment & Plan:   Principal Problem:   Sepsis secondary to aspiration pneumonia ([facility]) / leukocytosis - Sepsis criteria met on the admission. Patient required brief course of pressor support yesterday but currently is off of pressors - She was on vancomycin and Zosyn but currently on Zosyn only - Blood cultures so far show no growth to date - Urine culture shows no growth to date - Patient continues to be hypothermic with T 93.4 F. She was declining warming blanket yesterday but this morning warming blanket on - Palliative care is assisting with goals of care and we appreciate their help very much  Active Problems:   Acute respiratory failure with hypoxia / aspiration pneumonia / large bilateral pleural effusion - Likely from bilateral pleural effusions and aspiration pneumonia - Stable respiratory status but oxygen saturation is 89% on nasal cannula - Continue oxygen support via nasal cannula to keep oxygen saturation above 90% - She has refused nonrebreather mask yesterday - Toracentesis done 11/3 with 1500 ml of clear  fluids removed and rent for cytogenetics, flow and cell counts  - CXR done 11/3 showed successful removal of considerable pleural fluid on the right. A small amount remains. There is no postprocedure pneumothorax. Worsening of alveolar opacity in the left mid and upper lung consistent with progressive pneumonia or postobstructive atelectasis. Moderate-sized left pleural effusion which appears to be loculated laterally    Hypokalemia / Hypomagnesemia - Secondary to sepsis - Supplemented and within normal limits    Sacral decubitus ulcer, stage IV - Patient has sacral decubitus ulcer stage IV and multiple other skin wounds - Appreciate wound care assessment - Unstageable Pressure Injury lateral LLE; 14cm x 2cm x 0.1; 95% black eschar with 5% yellow at wound edges;slightly fluctuant - Unstageable Pressure Injury lateral RLE: 5.5cm x 2.5cm x 0.1cm; 50% eschar with 50% ruptured bulla at wound edges - Stage 4 Pressure Injury sacrum: 6.5cm x 11cm x 1.3cm with 0.5cm of undermining from 11-1 o'clock; 95% red, some early granulation; 5% yellow slough - Stage 3 Pressure Injuries right and left scapula; right-0.5cm x 0.2cm x 0.1cm; left-1.0cm x 0.2cm x 0.2cm; both 100% fibrinous yellow - Dressing procedure/placement/frequency: NPWT dressing change to the sacrum. 1pc of black foam used to fill the sacral wound, skin protected with drape to the right hip then 1pc of black foam used to bridge the TRAC pad away from the wound.  Seal obtained at 183mHG.  Use hydrocolloid at the distal edge of the wound just above the rectum to aid in seal of dressing and protect from bowel incontinence. Silicone foam to the bilateral scapula to protect and insulate. Silicone foam to the RLE wound to protect  and insulate. Added enzymatic debridement ointment to the LLE since fluctuant to clear the necrotic tissue and cover with moist gauze dressing. Change NPWT dressings M/W/F, foam dressings every 3 days, and the enzymatic debridement  dressing daily. Low air loss mattress in place for pressure redistribution.     Severe protein calorie malnutrition - In the context of chronic illness - SLP evaluation will be done today, last SLP evaluation 05/29/2016 recommended full liquid diet - May continue nutritional supplementation    Quadriplegia - Stable, awaiting palliative care discussion for goals of care    Anemia of chronic disease - Hemoglobin is 10.5 this morning    DVT prophylaxis: SCDs bilaterally Code Status: DNR/DNI  Family Communication: No family at the bedside Disposition Plan: Not medically stable for discharge at this point, palliative care assisting with goals of care, poor prognosis   Consultants:   Palliative care   SLP - full liquid diet as of 05/29/16  Nutrition   Procedures:   1 unit PRBC 05/30/2016  Thoracentesis 11/3 - 1.5 L fluid removed   Antimicrobials:   Vancomycin 06/05/2016 --> 05/30/2016  Zosyn 06/08/2016 -->   Subjective: Continues to be hypothermic, declined warming blanket yesterday and a nonrebreather mask.  Objective: Vitals:   26-Jun-2016 0300 2016-06-26 0400 2016/06/26 0500 26-Jun-2016 0600  BP: 115/81 112/69 106/71 106/64  Pulse: 81 81 81 86  Resp: (!) 24 (!) 24 (!) 23 (!) 25  Temp: (!) 93.2 F (34 C) (!) 93.2 F (34 C) (!) 93.4 F (34.1 C) (!) 93.4 F (34.1 C)  TempSrc:      SpO2: 94% 92% 91% 90%  Weight:      Height:        Intake/Output Summary (Last 24 hours) at 06/26/2016 0716 Last data filed at 06/26/16 5027  Gross per 24 hour  Intake          43614.5 ml  Output              735 ml  Net          42879.5 ml   Filed Weights   06/19/2016 0650 06/11/2016 1030  Weight: 48.6 kg (107 lb 2.3 oz) 55.9 kg (123 lb 3.8 oz)    Examination:  General exam: No distress Respiratory system: Coarse breath sounds, diminished Cardiovascular system: S1 & S2 heard, Rate controlled  Gastrointestinal system: (+) BS, non tender abdomen  Central nervous system: quadriplegic  otherwise no focal deficits  Extremities: edematous UE and lower extremities  Skin: sacral stage 4 decubitus ulcer with wound vac, no rash Psychiatry: Not agitated or restless   Data Reviewed: I have personally reviewed following labs and imaging studies  CBC:  Recent Labs Lab 06/20/2016 0613 06/07/2016 1147 05/29/16 0357 05/30/16 0505 05/31/16 0500  WBC 8.2 10.2 11.8* 6.8 8.3  NEUTROABS 7.5  --   --  6.1 7.6  HGB 9.3* 8.9* 7.4* 6.4* 10.5*  HCT 26.7* 25.3* 20.8* 18.1* 29.6*  MCV 84.0 84.9 84.6 84.2 85.1  PLT 404* 384 372 267 741   Basic Metabolic Panel:  Recent Labs Lab 05/29/16 0357 05/29/16 0955 05/29/16 2047 05/30/16 0505 05/31/16 0500 06/26/2016 0525  NA 132* 130* 130* 130* 131*  --   K 3.3* 3.4* 4.0 4.1 4.3  --   CL 111 107 107 107 107  --   CO2 14* 16* 19* 19* 18*  --   GLUCOSE 101* 138* 188* 194* 193*  --   BUN <5* <5* <5* <5* <5*  --  CREATININE <0.30* 0.32* 0.31* 0.37* 0.31*  --   CALCIUM 5.8* 6.6* 6.6* 6.6* 6.9*  --   MG 1.6*  --  2.2 2.2 2.3 2.3  PHOS 1.2*  --  1.8* 1.6* 1.2* 1.6*   GFR: Estimated Creatinine Clearance: 58.6 mL/min (by C-G formula based on SCr of 0.31 mg/dL (L)). Liver Function Tests:  Recent Labs Lab 06/22/2016 7035 06/23/2016 1147 05/29/16 0357 05/30/16 1022 05/30/16 1035 05/31/16 0500  AST 158* 233* 510* 441*  --  194*  ALT 60* 86* 216* 319*  --  259*  ALKPHOS 131* 124 136* 295*  --  331*  BILITOT 0.9 0.9 1.2 1.2  --  1.1  PROT 4.1* 3.6* 3.9* 3.7* 3.8* 3.6*  ALBUMIN 1.3* 1.2* 2.0* 1.8*  --  1.6*   No results for input(s): LIPASE, AMYLASE in the last 168 hours. No results for input(s): AMMONIA in the last 168 hours. Coagulation Profile: No results for input(s): INR, PROTIME in the last 168 hours. Cardiac Enzymes: No results for input(s): CKTOTAL, CKMB, CKMBINDEX, TROPONINI in the last 168 hours. BNP (last 3 results) No results for input(s): PROBNP in the last 8760 hours. HbA1C: No results for input(s): HGBA1C in the last 72  hours. CBG:  Recent Labs Lab 05/29/16 1611 05/31/16 0849  GLUCAP 177* 185*   Lipid Profile: No results for input(s): CHOL, HDL, LDLCALC, TRIG, CHOLHDL, LDLDIRECT in the last 72 hours. Thyroid Function Tests: No results for input(s): TSH, T4TOTAL, FREET4, T3FREE, THYROIDAB in the last 72 hours. Anemia Panel: No results for input(s): VITAMINB12, FOLATE, FERRITIN, TIBC, IRON, RETICCTPCT in the last 72 hours. Urine analysis:    Component Value Date/Time   COLORURINE YELLOW 05/29/2016 1113   APPEARANCEUR CLOUDY (A) 06/24/2016 1113   LABSPEC 1.014 06/22/2016 1113   PHURINE 6.5 06/08/2016 1113   GLUCOSEU NEGATIVE 06/14/2016 1113   GLUCOSEU NEG mg/dL 12/19/2008 2131   HGBUR NEGATIVE 06/14/2016 1113   HGBUR moderate 02/26/2009 0829   BILIRUBINUR NEGATIVE 06/23/2016 1113   BILIRUBINUR neg 08/17/2015 1556   KETONESUR NEGATIVE 06/04/2016 1113   PROTEINUR NEGATIVE 06/16/2016 1113   UROBILINOGEN 1.0 08/17/2015 1556   UROBILINOGEN 1.0 03/19/2015 1230   NITRITE NEGATIVE 06/18/2016 1113   LEUKOCYTESUR NEGATIVE 06/07/2016 1113   Sepsis Labs: _0 (procalcitonin:4,lacticidven:4)    Recent Results (from the past 240 hour(s))  Blood culture (routine x 2)     Status: None (Preliminary result)   Collection Time: 06/19/2016  7:50 AM  Result Value Ref Range Status   Specimen Description BLOOD RIGHT ANTECUBITAL  Final   Special Requests BOTTLES DRAWN AEROBIC AND ANAEROBIC 5ML  Final   Culture   Final    NO GROWTH 3 DAYS Performed at [facility]    Report Status PENDING  Incomplete  Blood culture (routine x 2)     Status: None (Preliminary result)   Collection Time: 06/06/2016  8:10 AM  Result Value Ref Range Status   Specimen Description BLOOD LEFT HAND  Final   Special Requests IN PEDIATRIC BOTTLE 6ML  Final   Culture   Final    NO GROWTH 3 DAYS Performed at [facility]    Report Status PENDING  Incomplete  Urine culture     Status: None   Collection Time:  06/15/2016  8:10 AM  Result Value Ref Range Status   Specimen Description URINE, CATHETERIZED  Final   Special Requests NONE  Final   Culture NO GROWTH Performed at [facility]   Final  Report Status 05/29/2016 FINAL  Final  MRSA PCR Screening     Status: None   Collection Time: 06/08/2016 10:36 AM  Result Value Ref Range Status   MRSA by PCR NEGATIVE NEGATIVE Final    Comment:        The GeneXpert MRSA Assay (FDA approved for NASAL specimens only), is one component of a comprehensive MRSA colonization surveillance program. It is not intended to diagnose MRSA infection nor to guide or monitor treatment for MRSA infections.   Culture, Urine     Status: None   Collection Time: 05/31/2016 11:14 AM  Result Value Ref Range Status   Specimen Description URINE, CATHETERIZED  Final   Special Requests NONE  Final   Culture NO GROWTH Performed at [facility]   Final   Report Status 05/30/2016 FINAL  Final  Body fluid culture     Status: None (Preliminary result)   Collection Time: 05/30/16 10:15 AM  Result Value Ref Range Status   Specimen Description PLEURAL  Final   Special Requests NONE  Final   Gram Stain   Final    RARE WBC PRESENT, PREDOMINANTLY MONONUCLEAR NO ORGANISMS SEEN    Culture   Final    NO GROWTH < 24 HOURS Performed at [facility]    Report Status PENDING  Incomplete  C difficile quick scan w PCR reflex     Status: None   Collection Time: 05/31/16  7:45 AM  Result Value Ref Range Status   C Diff antigen NEGATIVE NEGATIVE Final   C Diff toxin NEGATIVE NEGATIVE Final   C Diff interpretation No C. difficile detected.  Final      Radiology Studies: Ct Chest Wo Contrast Result Date: 05/29/2016 1. Left upper lobe consolidation consistent with pneumonia and diffuse bronchitis greatest in the left lower lobe. Additional scattered areas of bronchiolitis predominantly in the left lung. 2. Large right and small to moderate left pleural  effusions. Lobular contour to the effusions suggests adhesions. Consider chest ultrasound to evaluate for complex exudate. 3. Probable hepatic steatosis.   Dg Chest Port 1 View Result Date: 06/19/2016  1. Left PICC terminates over the right atrium approximately 3 cm below the cavoatrial junction . 2. Stable nonspecific focal patchy opacity in the left mid lung. Stable patchy bibasilar lung opacities. Findings could represent multilobar pneumonia, with underlying mass not excluded. Follow-up chest imaging to resolution. 3. Stable small bilateral pleural effusions. 4. Aortic atherosclerosis.   Dg Chest Portable 1 View Result Date: 06/08/2016 1. Bilateral pleural effusions 2. Nodular opacity in the left mid lung, neoplasm versus infectious/inflammatory. 3. Radiographic follow-up or chest CT with contrast recommended, depending on clinical setting.     Scheduled Meds: . aspirin  162 mg Oral Daily  . collagenase   Topical Daily  . famotidine (PEPCID) IV  20 mg Intravenous Q24H  . feeding supplement  1 Container Oral Q24H  . feeding supplement (ENSURE ENLIVE)  237 mL Oral Q24H  . piperacillin-tazobactam (ZOSYN)  IV  3.375 g Intravenous Q8H  . polyethylene glycol  17 g Oral Daily  . sodium chloride flush  10-40 mL Intracatheter Q12H  . sodium chloride flush  3 mL Intravenous Q12H   Continuous Infusions: . dextrose 5% lactated ringers with KCl 20 mEq/L 75 mL/hr at 06-17-2016 0215  . phenylephrine (NEO-SYNEPHRINE) Adult infusion Stopped (05/29/16 1218)     LOS: 4 days    Time spent: 25 minutes  Greater than 50% of the time spent on  counseling and coordinating the care.   [name], MD [facility] Pager [phone]  If 7PM-7AM, please contact night-coverage [email or web address] Password [email or web address] Jun 13, 2016, 7:16 AM

## 2016-06-27 NOTE — Progress Notes (Signed)
Palliative called POA and informed of the patients decline in status. Patient minimally responsive to verbal stimuli and blood pressure dropping. Patient remains hypothermic. POA understands patients condition and family has been called to come in. Once family at bedside palliative MD paged and she spoke with family. Family on board with comfort measures. Comfort cart provided.

## 2016-06-27 NOTE — Discharge Summary (Signed)
Death Summary  BRINN WESTBY HYI:[id number] DOB: 1945/09/28 DOA: June 18, 2016  PCP: Laurey Morale, MD PCP/Office notified  Admit date: 06/18/2016 Date of Death: 06-27-2016  Final Diagnoses:  Principal Problem:   Sepsis ([place]) Active Problems:   Hypokalemia   Sacral decubitus ulcer   Hypomagnesemia   Encounter for palliative care   Protein-calorie malnutrition, severe ([place])   CAP (community acquired pneumonia)   Paraplegia ([place])   Shock circulatory ([place])   Lactic acidosis   Lung nodule   HCAP (healthcare-associated pneumonia)   Acute respiratory failure with hypoxia ([facility])   Dyspnea    History of present illness:  70 year old female with cervical myelopathy and quadriplegia, severe protein calorie malnutrition, hypertension. She presented to hospital 06-18-16 with generalized pain, fatigue. Patient was found to have septic shock secondary to pneumonia and multiple skin wounds. She also was found to have large bilateral pleural effusions. Patient was under critical care medicine through 05/30/2016.  Patient has significantly declined over past 24-48 hours. She continues to be hypothermic and declining to have warming blankets. She is also hypoxic and does not want to be on a nonrebreather mask. Appreciate very much palliative care addressing goals of care.  Hospital Course:    Assessment & Plan:   Principal Problem:   Sepsis secondary to aspiration pneumonia ([place]) / leukocytosis - Sepsis criteria met on the admission. Patient required brief course of pressor support yesterday but currently is off of pressors - She was on vancomycin and Zosyn but currently on Zosyn only - Blood cultures so far show no growth to date - Urine culture shows no growth to date - Patient continues to be hypothermic with T 93.4 F. She was declining warming blanket yesterday but this morning warming blanket on - Palliative care was assisting with goals of care   Active Problems:   Acute  respiratory failure with hypoxia / aspiration pneumonia / large bilateral pleural effusion - Likely from bilateral pleural effusions and aspiration pneumonia - Stable respiratory status but oxygen saturation is 89% on nasal cannula - Continue oxygen support via nasal cannula to keep oxygen saturation above 90% - She has refused nonrebreather mask yesterday - Toracentesis done 11/3 with 1500 ml of clear fluids removed and rent for cytogenetics, flow and cell counts  - CXR done 11/3 showed successful removal of considerable pleural fluid on the right. A small amount remains. There is no postprocedure pneumothorax. Worsening of alveolar opacity in the left mid and upper lung consistent with progressive pneumonia or postobstructive atelectasis. Moderate-sized left pleural effusion which appears to be loculated laterally    Hypokalemia / Hypomagnesemia - Secondary to sepsis - Supplemented and within normal limits    Sacral decubitus ulcer, stage IV - Patient has sacral decubitus ulcer stage IV and multiple other skin wounds - Appreciate wound care assessment - Unstageable Pressure Injury lateral LLE; 14cm x 2cm x 0.1; 95% black eschar with 5% yellow at wound edges;slightly fluctuant - Unstageable Pressure Injury lateral RLE: 5.5cm x 2.5cm x 0.1cm; 50% eschar with 50% ruptured bulla at wound edges - Stage 4 Pressure Injury sacrum: 6.5cm x 11cm x 1.3cm with 0.5cm of undermining from 11-1 o'clock; 95% red, some early granulation; 5% yellow slough - Stage 3 Pressure Injuries right and left scapula; right-0.5cm x 0.2cm x 0.1cm; left-1.0cm x 0.2cm x 0.2cm; both 100% fibrinous yellow - Dressing procedure/placement/frequency: NPWT dressing change to the sacrum. 1pc of black foam used to fill the sacral wound, skin protected with drape to the  right hip then 1pc of black foam used to bridge the [facility] pad away from the wound. Seal obtained at 124mHG. Use hydrocolloid at the distal edge of the wound just above  the rectum to aid in seal of dressing and protect from bowel incontinence. Silicone foam to the bilateral scapula to protect and insulate. Silicone foam to the RLE wound to protect and insulate. Added enzymatic debridement ointment to the LLE since fluctuant to clear the necrotic tissue and cover with moist gauze dressing. Change NPWT dressings M/W/F, foam dressings every 3 days, and the enzymatic debridement dressing daily. Low air loss mattress in place for pressure redistribution.     Severe protein calorie malnutrition - In the context of chronic illness - SLP evaluation will be done today, last SLP evaluation 05/29/2016 recommended full liquid diet - Continue nutritional supplementation     Quadriplegia - Stable     Anemia of chronic disease - Hemoglobin is 10.5 this morning    DVT prophylaxis: SCDs bilaterally Code Status: DNR/DNI     Consultants:   Palliative care   SLP - full liquid diet as of 05/29/16  Nutrition   Procedures:   1 unit PRBC 05/30/2016  Thoracentesis 11/3 - 1.5 L fluid removed   Antimicrobials:   Vancomycin 06/24/2016 --> 05/30/2016  Zosyn 06/23/2016 -->    Time: 21: 56 on 112-08-2015  Signed:  DLeisa Lenz [facility] 06/06/2016, 10:48 PM

## 2016-06-27 DEATH — deceased

## 2017-05-20 IMAGING — RF DG SWALLOWING FUNCTION - NRPT [facility]
12 series · 24 of 24 positions shown · non-contrast
Comparison: none

[Series 1: cp_standard · 0.34mm/px · 2 of 35 frames shown (1 of 12)]
[frame 6/35]
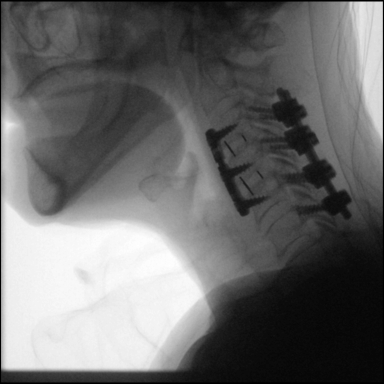
[frame 18/35]
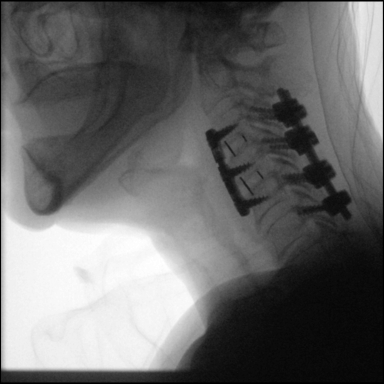

[Series 2: cp_standard · 0.34mm/px · 2 of 85 frames shown (2 of 12)]
[frame 13/85]
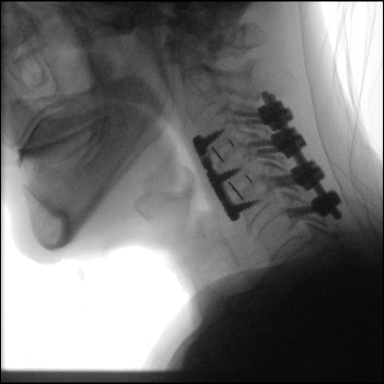
[frame 73/85]
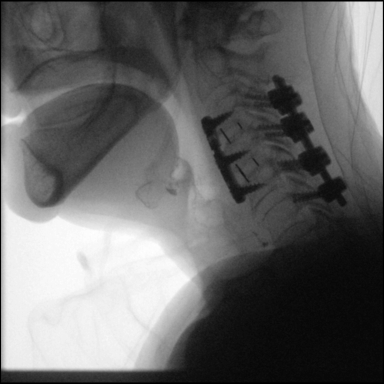

[Series 3: cp_standard · 0.34mm/px · 2 of 121 frames shown (3 of 12)]
[frame 1/121]
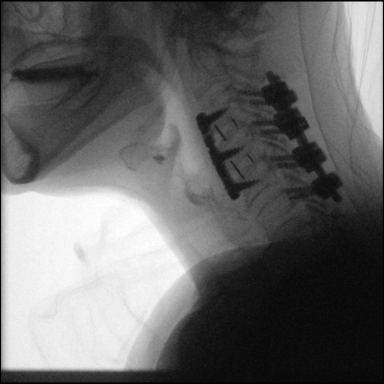
[frame 61/121]
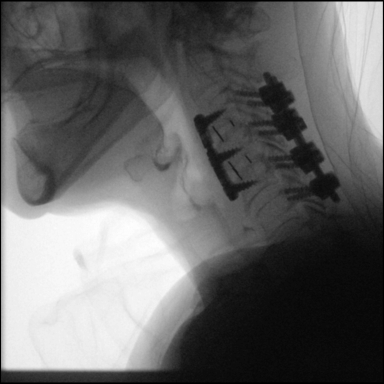

[Series 4: cp_standard · 0.34mm/px · 2 of 100 frames shown (4 of 12)]
[frame 16/100]
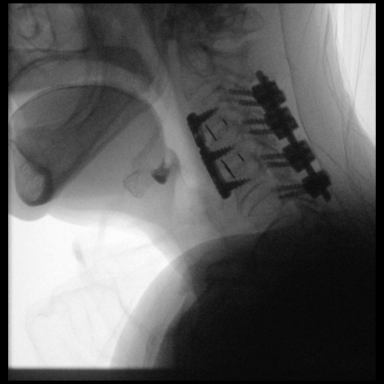
[frame 51/100]
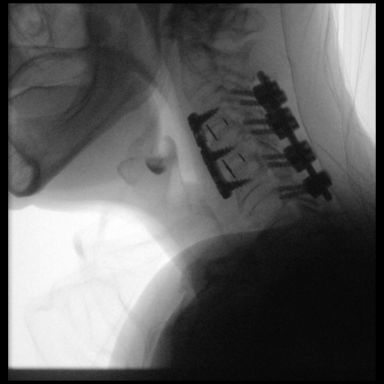

[Series 5: cp_standard · 0.34mm/px · 2 of 198 frames shown (5 of 12)]
[frame 30/198]
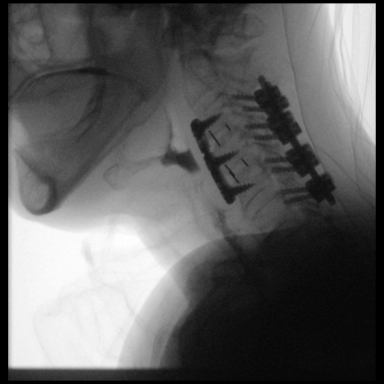
[frame 124/198]
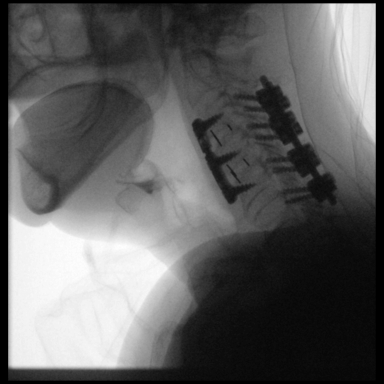

[Series 6: cp_standard · 0.34mm/px · 2 of 93 frames shown (6 of 12)]
[frame 14/93]
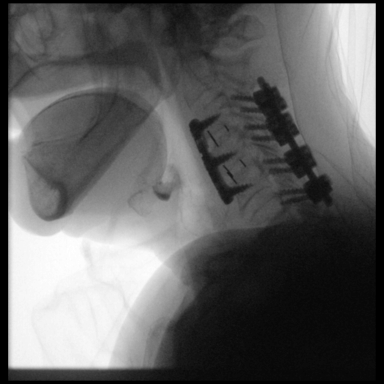
[frame 66/93]
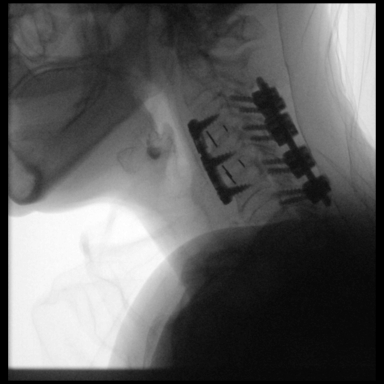

[Series 7: cp_standard · 0.34mm/px · 2 of 156 frames shown (7 of 12)]
[frame 67/156]
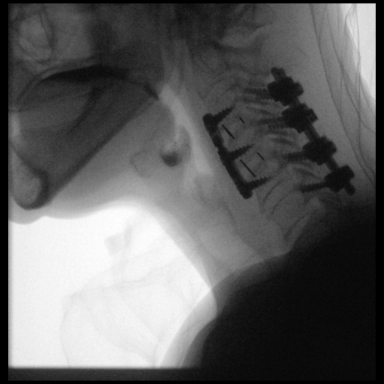
[frame 133/156]
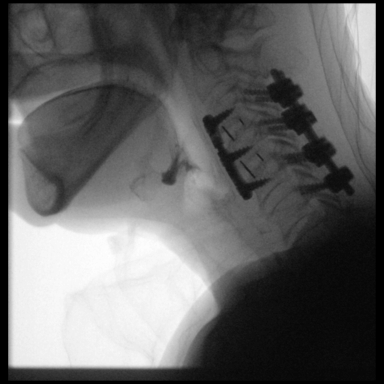

[Series 8: cp_standard · 0.34mm/px · 2 of 222 frames shown (8 of 12)]
[frame 91/222]
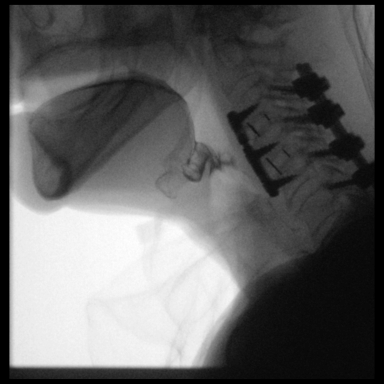
[frame 189/222]
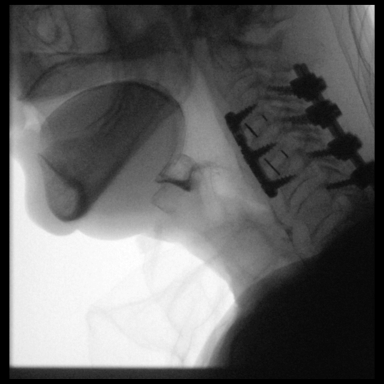

[Series 9: cp_standard · 0.34mm/px · 2 of 223 frames shown (9 of 12)]
[frame 112/223]
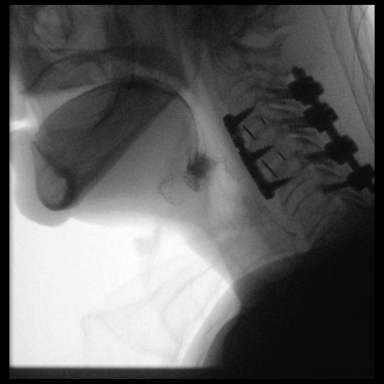
[frame 204/223]
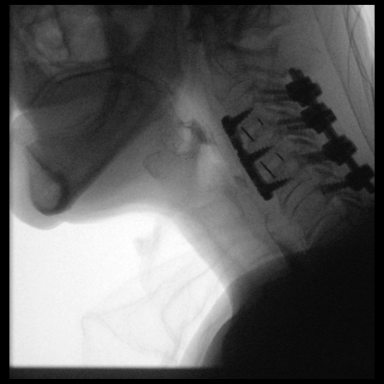

[Series 10: cp_standard · 0.34mm/px · 2 of 241 frames shown (10 of 12)]
[frame 102/241]
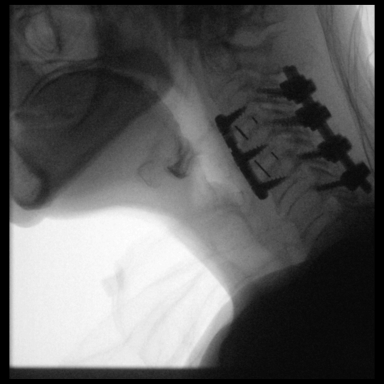
[frame 205/241]
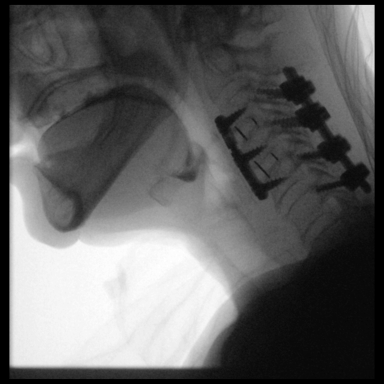

[Series 11: cp_standard · 0.34mm/px · 2 of 78 frames shown (11 of 12)]
[frame 12/78]
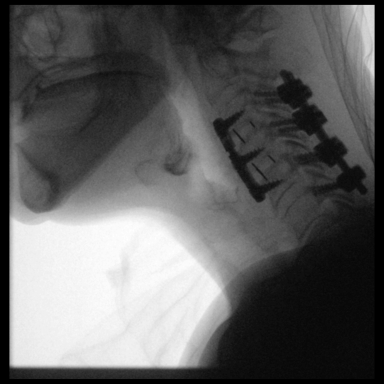
[frame 67/78]
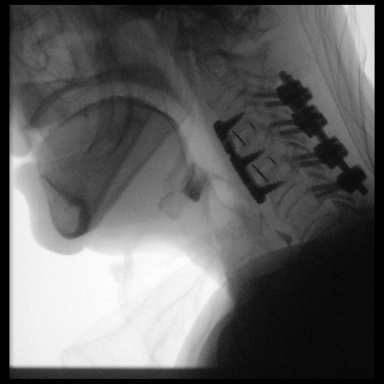

[Series 12: cp_standard · 0.34mm/px · 2 of 83 frames shown (12 of 12)]
[frame 24/83]
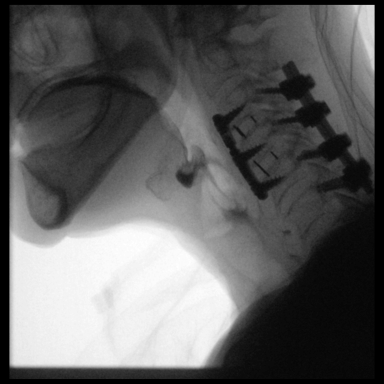
[frame 71/83]
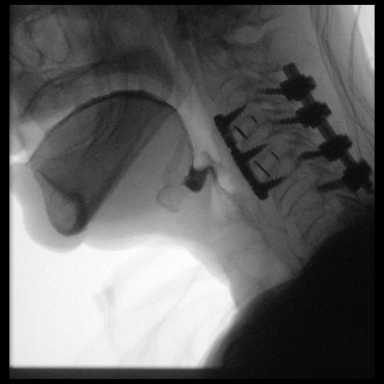

[24 of 24 positions shown; findings below may reference images not displayed]

FLUOROSCOPY FOR SWALLOWING FUNCTION STUDY:
Fluoroscopy was provided for swallowing function study, which was administered by a speech pathologist.  Final results and recommendations from this study are contained within the speech pathology report.
